# Patient Record
Sex: Male | Born: 1935 | Race: White | Hispanic: No | Marital: Married | Smoking: Former smoker
Health system: Southern US, Community
[De-identification: ages and names within clinical notes are randomized; demographics above are authoritative.]

## PROBLEM LIST (undated history)

## (undated) DIAGNOSIS — Z9289 Personal history of other medical treatment: Secondary | ICD-10-CM

## (undated) DIAGNOSIS — I2510 Atherosclerotic heart disease of native coronary artery without angina pectoris: Secondary | ICD-10-CM

## (undated) DIAGNOSIS — J189 Pneumonia, unspecified organism: Secondary | ICD-10-CM

## (undated) DIAGNOSIS — I10 Essential (primary) hypertension: Secondary | ICD-10-CM

## (undated) DIAGNOSIS — E785 Hyperlipidemia, unspecified: Secondary | ICD-10-CM

## (undated) DIAGNOSIS — I739 Peripheral vascular disease, unspecified: Secondary | ICD-10-CM

## (undated) DIAGNOSIS — I480 Paroxysmal atrial fibrillation: Secondary | ICD-10-CM

## (undated) DIAGNOSIS — IMO0002 Reserved for concepts with insufficient information to code with codable children: Secondary | ICD-10-CM

## (undated) DIAGNOSIS — K5790 Diverticulosis of intestine, part unspecified, without perforation or abscess without bleeding: Secondary | ICD-10-CM

## (undated) DIAGNOSIS — I219 Acute myocardial infarction, unspecified: Secondary | ICD-10-CM

## (undated) DIAGNOSIS — N189 Chronic kidney disease, unspecified: Secondary | ICD-10-CM

## (undated) DIAGNOSIS — D649 Anemia, unspecified: Secondary | ICD-10-CM

## (undated) DIAGNOSIS — N400 Enlarged prostate without lower urinary tract symptoms: Secondary | ICD-10-CM

## (undated) DIAGNOSIS — K449 Diaphragmatic hernia without obstruction or gangrene: Secondary | ICD-10-CM

## (undated) DIAGNOSIS — K2970 Gastritis, unspecified, without bleeding: Secondary | ICD-10-CM

## (undated) DIAGNOSIS — R55 Syncope and collapse: Secondary | ICD-10-CM

## (undated) DIAGNOSIS — I499 Cardiac arrhythmia, unspecified: Secondary | ICD-10-CM

## (undated) DIAGNOSIS — K219 Gastro-esophageal reflux disease without esophagitis: Secondary | ICD-10-CM

## (undated) DIAGNOSIS — E538 Deficiency of other specified B group vitamins: Secondary | ICD-10-CM

## (undated) DIAGNOSIS — E039 Hypothyroidism, unspecified: Secondary | ICD-10-CM

## (undated) DIAGNOSIS — N200 Calculus of kidney: Secondary | ICD-10-CM

## (undated) HISTORY — DX: Essential (primary) hypertension: I10

## (undated) HISTORY — DX: Peripheral vascular disease, unspecified: I73.9

## (undated) HISTORY — DX: Syncope and collapse: R55

## (undated) HISTORY — PX: CATARACT EXTRACTION: SUR2

## (undated) HISTORY — DX: Diverticulosis of intestine, part unspecified, without perforation or abscess without bleeding: K57.90

## (undated) HISTORY — DX: Personal history of other medical treatment: Z92.89

## (undated) HISTORY — DX: Hyperlipidemia, unspecified: E78.5

## (undated) HISTORY — DX: Diaphragmatic hernia without obstruction or gangrene: K44.9

## (undated) HISTORY — DX: Paroxysmal atrial fibrillation: I48.0

## (undated) HISTORY — DX: Atherosclerotic heart disease of native coronary artery without angina pectoris: I25.10

## (undated) HISTORY — DX: Deficiency of other specified B group vitamins: E53.8

## (undated) HISTORY — DX: Anemia, unspecified: D64.9

## (undated) HISTORY — DX: Reserved for concepts with insufficient information to code with codable children: IMO0002

## (undated) HISTORY — DX: Hypothyroidism, unspecified: E03.9

## (undated) HISTORY — DX: Benign prostatic hyperplasia without lower urinary tract symptoms: N40.0

## (undated) HISTORY — DX: Chronic kidney disease, unspecified: N18.9

## (undated) HISTORY — DX: Calculus of kidney: N20.0

## (undated) HISTORY — DX: Acute myocardial infarction, unspecified: I21.9

## (undated) HISTORY — DX: Gastritis, unspecified, without bleeding: K29.70

## (undated) HISTORY — DX: Gastro-esophageal reflux disease without esophagitis: K21.9

---

## 1988-10-05 DIAGNOSIS — I10 Essential (primary) hypertension: Secondary | ICD-10-CM | POA: Insufficient documentation

## 1991-10-06 DIAGNOSIS — E1149 Type 2 diabetes mellitus with other diabetic neurological complication: Secondary | ICD-10-CM

## 1996-07-05 HISTORY — PX: ANGIOPLASTY: SHX39

## 1997-05-25 ENCOUNTER — Encounter: Payer: Self-pay | Admitting: Family Medicine

## 1997-05-25 LAB — CONVERTED CEMR LAB: PSA: 1.1 ng/mL

## 1997-10-05 DIAGNOSIS — Z87898 Personal history of other specified conditions: Secondary | ICD-10-CM | POA: Insufficient documentation

## 1998-09-04 ENCOUNTER — Encounter: Payer: Self-pay | Admitting: Family Medicine

## 1998-09-04 LAB — CONVERTED CEMR LAB: Hgb A1c MFr Bld: 8.4 %

## 1999-02-03 ENCOUNTER — Encounter: Payer: Self-pay | Admitting: Family Medicine

## 1999-06-06 ENCOUNTER — Encounter: Payer: Self-pay | Admitting: Family Medicine

## 1999-06-06 LAB — CONVERTED CEMR LAB: Hgb A1c MFr Bld: 6.9 %

## 2000-02-03 ENCOUNTER — Encounter: Payer: Self-pay | Admitting: Family Medicine

## 2000-02-03 DIAGNOSIS — D649 Anemia, unspecified: Secondary | ICD-10-CM | POA: Insufficient documentation

## 2000-02-03 DIAGNOSIS — E538 Deficiency of other specified B group vitamins: Secondary | ICD-10-CM | POA: Insufficient documentation

## 2000-04-27 ENCOUNTER — Encounter: Payer: Self-pay | Admitting: Urology

## 2000-04-29 ENCOUNTER — Ambulatory Visit (HOSPITAL_COMMUNITY): Admission: RE | Admit: 2000-04-29 | Discharge: 2000-04-29 | Payer: Self-pay | Admitting: Urology

## 2000-04-29 ENCOUNTER — Encounter: Payer: Self-pay | Admitting: Urology

## 2000-04-29 HISTORY — PX: LITHOTRIPSY: SUR834

## 2000-09-04 ENCOUNTER — Encounter: Payer: Self-pay | Admitting: Family Medicine

## 2001-10-05 ENCOUNTER — Encounter: Payer: Self-pay | Admitting: Family Medicine

## 2001-10-05 LAB — CONVERTED CEMR LAB: PSA: 1 ng/mL

## 2002-04-04 ENCOUNTER — Encounter: Payer: Self-pay | Admitting: Family Medicine

## 2002-04-04 LAB — CONVERTED CEMR LAB: Hgb A1c MFr Bld: 6.7 %

## 2002-11-05 ENCOUNTER — Encounter: Payer: Self-pay | Admitting: Family Medicine

## 2002-11-05 LAB — CONVERTED CEMR LAB
Hgb A1c MFr Bld: 7.1 %
Microalbumin U total vol: 9 mg/L

## 2003-06-21 ENCOUNTER — Encounter: Payer: Self-pay | Admitting: Emergency Medicine

## 2003-06-21 ENCOUNTER — Inpatient Hospital Stay (HOSPITAL_COMMUNITY): Admission: EM | Admit: 2003-06-21 | Discharge: 2003-06-26 | Payer: Self-pay | Admitting: Emergency Medicine

## 2003-06-22 HISTORY — PX: CARDIAC CATHETERIZATION: SHX172

## 2003-06-25 ENCOUNTER — Encounter (INDEPENDENT_AMBULATORY_CARE_PROVIDER_SITE_OTHER): Payer: Self-pay | Admitting: Specialist

## 2003-07-06 ENCOUNTER — Encounter: Payer: Self-pay | Admitting: Family Medicine

## 2003-07-06 LAB — CONVERTED CEMR LAB: Hgb A1c MFr Bld: 6.1 %

## 2003-11-06 ENCOUNTER — Encounter: Payer: Self-pay | Admitting: Family Medicine

## 2003-11-06 LAB — CONVERTED CEMR LAB
Microalbumin U total vol: 6 mg/L
PSA: 0.6 ng/mL
PSA: 0.6 ng/mL

## 2004-04-15 ENCOUNTER — Ambulatory Visit (HOSPITAL_COMMUNITY): Admission: RE | Admit: 2004-04-15 | Discharge: 2004-04-16 | Payer: Self-pay | Admitting: Cardiology

## 2004-05-16 HISTORY — PX: ILIAC VEIN ANGIOPLASTY / STENTING: SHX1788

## 2004-06-05 DIAGNOSIS — E039 Hypothyroidism, unspecified: Secondary | ICD-10-CM | POA: Insufficient documentation

## 2004-07-05 ENCOUNTER — Encounter: Payer: Self-pay | Admitting: Family Medicine

## 2004-07-05 LAB — CONVERTED CEMR LAB: Hgb A1c MFr Bld: 6.6 %

## 2004-10-03 ENCOUNTER — Ambulatory Visit: Payer: Self-pay | Admitting: Family Medicine

## 2005-02-02 ENCOUNTER — Encounter: Payer: Self-pay | Admitting: Family Medicine

## 2005-02-02 LAB — CONVERTED CEMR LAB
Hgb A1c MFr Bld: 6.5 %
Microalbumin U total vol: 5.8 mg/L

## 2005-02-11 ENCOUNTER — Ambulatory Visit: Payer: Self-pay | Admitting: Family Medicine

## 2005-02-13 ENCOUNTER — Ambulatory Visit: Payer: Self-pay | Admitting: Family Medicine

## 2005-06-16 ENCOUNTER — Ambulatory Visit: Payer: Self-pay | Admitting: Family Medicine

## 2005-08-05 DIAGNOSIS — M109 Gout, unspecified: Secondary | ICD-10-CM

## 2005-09-04 ENCOUNTER — Encounter: Payer: Self-pay | Admitting: Family Medicine

## 2005-09-14 ENCOUNTER — Ambulatory Visit: Payer: Self-pay | Admitting: Family Medicine

## 2005-09-16 ENCOUNTER — Ambulatory Visit: Payer: Self-pay | Admitting: Family Medicine

## 2005-10-01 ENCOUNTER — Ambulatory Visit: Payer: Self-pay | Admitting: Family Medicine

## 2005-10-02 ENCOUNTER — Ambulatory Visit: Payer: Self-pay | Admitting: Family Medicine

## 2005-10-15 ENCOUNTER — Ambulatory Visit: Payer: Self-pay | Admitting: Family Medicine

## 2005-10-29 ENCOUNTER — Ambulatory Visit: Payer: Self-pay | Admitting: Family Medicine

## 2005-11-05 ENCOUNTER — Encounter: Payer: Self-pay | Admitting: Family Medicine

## 2005-11-05 LAB — CONVERTED CEMR LAB: Hgb A1c MFr Bld: 6.8 %

## 2005-11-13 ENCOUNTER — Ambulatory Visit: Payer: Self-pay | Admitting: Family Medicine

## 2005-11-19 ENCOUNTER — Ambulatory Visit: Payer: Self-pay | Admitting: Family Medicine

## 2005-11-24 ENCOUNTER — Ambulatory Visit: Payer: Self-pay | Admitting: Family Medicine

## 2005-12-10 ENCOUNTER — Ambulatory Visit: Payer: Self-pay | Admitting: Family Medicine

## 2005-12-25 ENCOUNTER — Ambulatory Visit: Payer: Self-pay | Admitting: Family Medicine

## 2006-01-06 ENCOUNTER — Ambulatory Visit: Payer: Self-pay | Admitting: Family Medicine

## 2006-01-21 ENCOUNTER — Ambulatory Visit: Payer: Self-pay | Admitting: Family Medicine

## 2006-02-02 ENCOUNTER — Encounter: Payer: Self-pay | Admitting: Family Medicine

## 2006-02-02 LAB — CONVERTED CEMR LAB
Hgb A1c MFr Bld: 6.7 %
PSA: 0.78 ng/mL

## 2006-02-03 ENCOUNTER — Ambulatory Visit: Payer: Self-pay | Admitting: Cardiology

## 2006-02-04 ENCOUNTER — Ambulatory Visit: Payer: Self-pay | Admitting: Family Medicine

## 2006-02-11 ENCOUNTER — Ambulatory Visit: Payer: Self-pay | Admitting: Family Medicine

## 2006-02-12 ENCOUNTER — Ambulatory Visit: Payer: Self-pay

## 2006-02-18 ENCOUNTER — Ambulatory Visit: Payer: Self-pay | Admitting: Family Medicine

## 2006-03-02 ENCOUNTER — Ambulatory Visit: Payer: Self-pay | Admitting: Family Medicine

## 2006-03-04 ENCOUNTER — Ambulatory Visit: Payer: Self-pay | Admitting: Family Medicine

## 2006-03-05 LAB — FECAL OCCULT BLOOD, GUAIAC: Fecal Occult Blood: NEGATIVE

## 2006-03-10 ENCOUNTER — Ambulatory Visit: Payer: Self-pay | Admitting: Family Medicine

## 2006-03-19 ENCOUNTER — Ambulatory Visit: Payer: Self-pay | Admitting: Family Medicine

## 2006-03-24 ENCOUNTER — Ambulatory Visit: Payer: Self-pay | Admitting: Family Medicine

## 2006-03-29 ENCOUNTER — Ambulatory Visit: Payer: Self-pay | Admitting: Family Medicine

## 2006-04-01 ENCOUNTER — Ambulatory Visit: Payer: Self-pay | Admitting: Family Medicine

## 2006-04-15 ENCOUNTER — Ambulatory Visit: Payer: Self-pay | Admitting: Family Medicine

## 2006-04-29 ENCOUNTER — Ambulatory Visit: Payer: Self-pay | Admitting: Family Medicine

## 2006-05-13 ENCOUNTER — Ambulatory Visit: Payer: Self-pay | Admitting: Internal Medicine

## 2006-05-27 ENCOUNTER — Ambulatory Visit: Payer: Self-pay | Admitting: Family Medicine

## 2006-06-11 ENCOUNTER — Ambulatory Visit: Payer: Self-pay | Admitting: Family Medicine

## 2006-06-22 ENCOUNTER — Ambulatory Visit: Payer: Self-pay | Admitting: Family Medicine

## 2006-07-02 ENCOUNTER — Ambulatory Visit: Payer: Self-pay | Admitting: Family Medicine

## 2006-07-22 ENCOUNTER — Ambulatory Visit: Payer: Self-pay | Admitting: Family Medicine

## 2006-08-06 ENCOUNTER — Ambulatory Visit: Payer: Self-pay | Admitting: Family Medicine

## 2006-08-19 ENCOUNTER — Inpatient Hospital Stay (HOSPITAL_COMMUNITY): Admission: EM | Admit: 2006-08-19 | Discharge: 2006-08-20 | Payer: Self-pay | Admitting: Emergency Medicine

## 2006-08-19 ENCOUNTER — Encounter: Payer: Self-pay | Admitting: Cardiology

## 2006-08-19 ENCOUNTER — Ambulatory Visit: Payer: Self-pay | Admitting: *Deleted

## 2006-08-20 ENCOUNTER — Ambulatory Visit: Payer: Self-pay | Admitting: Family Medicine

## 2006-08-24 ENCOUNTER — Ambulatory Visit: Payer: Self-pay

## 2006-08-24 ENCOUNTER — Ambulatory Visit: Payer: Self-pay | Admitting: Cardiovascular Disease

## 2006-08-30 ENCOUNTER — Ambulatory Visit: Payer: Self-pay | Admitting: Cardiology

## 2006-09-02 ENCOUNTER — Ambulatory Visit: Payer: Self-pay | Admitting: Family Medicine

## 2006-09-06 ENCOUNTER — Ambulatory Visit: Payer: Self-pay | Admitting: Cardiology

## 2006-09-14 ENCOUNTER — Ambulatory Visit: Payer: Self-pay | Admitting: Cardiovascular Disease

## 2006-09-16 ENCOUNTER — Ambulatory Visit: Payer: Self-pay | Admitting: Family Medicine

## 2006-09-20 ENCOUNTER — Ambulatory Visit: Payer: Self-pay | Admitting: Family Medicine

## 2006-09-21 ENCOUNTER — Inpatient Hospital Stay (HOSPITAL_COMMUNITY): Admission: EM | Admit: 2006-09-21 | Discharge: 2006-09-23 | Payer: Self-pay | Admitting: Emergency Medicine

## 2006-09-21 ENCOUNTER — Ambulatory Visit: Payer: Self-pay | Admitting: Cardiovascular Disease

## 2006-09-21 ENCOUNTER — Ambulatory Visit: Payer: Self-pay | Admitting: Cardiology

## 2006-09-30 ENCOUNTER — Ambulatory Visit: Payer: Self-pay | Admitting: Internal Medicine

## 2006-10-01 ENCOUNTER — Ambulatory Visit: Payer: Self-pay | Admitting: Cardiology

## 2006-10-08 ENCOUNTER — Ambulatory Visit: Payer: Self-pay | Admitting: Gastroenterology

## 2006-10-14 ENCOUNTER — Ambulatory Visit: Payer: Self-pay | Admitting: Family Medicine

## 2006-10-26 ENCOUNTER — Ambulatory Visit: Payer: Self-pay | Admitting: *Deleted

## 2006-10-26 ENCOUNTER — Ambulatory Visit: Payer: Self-pay | Admitting: Family Medicine

## 2006-11-09 ENCOUNTER — Ambulatory Visit: Payer: Self-pay | Admitting: Gastroenterology

## 2006-11-11 ENCOUNTER — Ambulatory Visit: Payer: Self-pay | Admitting: Family Medicine

## 2006-11-16 ENCOUNTER — Ambulatory Visit: Payer: Self-pay | Admitting: Cardiology

## 2006-11-22 ENCOUNTER — Ambulatory Visit: Payer: Self-pay | Admitting: Internal Medicine

## 2006-11-26 ENCOUNTER — Ambulatory Visit: Payer: Self-pay | Admitting: Family Medicine

## 2006-11-29 ENCOUNTER — Ambulatory Visit: Payer: Self-pay | Admitting: Cardiology

## 2006-12-07 ENCOUNTER — Ambulatory Visit: Payer: Self-pay | Admitting: Cardiology

## 2006-12-08 ENCOUNTER — Ambulatory Visit: Payer: Self-pay | Admitting: Gastroenterology

## 2006-12-10 ENCOUNTER — Ambulatory Visit: Payer: Self-pay | Admitting: Family Medicine

## 2006-12-17 ENCOUNTER — Ambulatory Visit: Payer: Self-pay | Admitting: Cardiovascular Disease

## 2006-12-23 ENCOUNTER — Ambulatory Visit: Payer: Self-pay | Admitting: Family Medicine

## 2006-12-31 ENCOUNTER — Ambulatory Visit: Payer: Self-pay | Admitting: Cardiology

## 2007-01-03 ENCOUNTER — Encounter: Payer: Self-pay | Admitting: Family Medicine

## 2007-01-03 DIAGNOSIS — R0602 Shortness of breath: Secondary | ICD-10-CM | POA: Insufficient documentation

## 2007-01-03 DIAGNOSIS — E785 Hyperlipidemia, unspecified: Secondary | ICD-10-CM

## 2007-01-06 ENCOUNTER — Ambulatory Visit: Payer: Self-pay | Admitting: Family Medicine

## 2007-01-06 LAB — CONVERTED CEMR LAB
BUN: 31 mg/dL — ABNORMAL HIGH (ref 6–23)
Calcium: 8.3 mg/dL — ABNORMAL LOW (ref 8.4–10.5)
Chloride: 106 meq/L (ref 96–112)
Creatinine, Ser: 2 mg/dL — ABNORMAL HIGH (ref 0.4–1.5)
GFR calc Af Amer: 42 mL/min
GFR calc non Af Amer: 35 mL/min
Glucose, Bld: 172 mg/dL — ABNORMAL HIGH (ref 70–99)
Potassium: 4 meq/L (ref 3.5–5.1)
Sodium: 143 meq/L (ref 135–145)

## 2007-01-10 ENCOUNTER — Ambulatory Visit: Payer: Self-pay | Admitting: Family Medicine

## 2007-01-14 ENCOUNTER — Ambulatory Visit: Payer: Self-pay | Admitting: Cardiovascular Disease

## 2007-01-20 ENCOUNTER — Ambulatory Visit: Payer: Self-pay | Admitting: Family Medicine

## 2007-01-21 ENCOUNTER — Ambulatory Visit: Payer: Self-pay | Admitting: Gastroenterology

## 2007-01-27 ENCOUNTER — Ambulatory Visit: Payer: Self-pay | Admitting: Cardiology

## 2007-02-04 ENCOUNTER — Ambulatory Visit: Payer: Self-pay | Admitting: Cardiology

## 2007-02-07 ENCOUNTER — Ambulatory Visit: Payer: Self-pay | Admitting: Family Medicine

## 2007-02-14 ENCOUNTER — Ambulatory Visit: Payer: Self-pay | Admitting: Family Medicine

## 2007-02-14 LAB — CONVERTED CEMR LAB
Calcium: 8.6 mg/dL (ref 8.4–10.5)
Chloride: 107 meq/L (ref 96–112)
GFR calc Af Amer: 48 mL/min
GFR calc non Af Amer: 39 mL/min
Potassium: 4.3 meq/L (ref 3.5–5.1)

## 2007-02-16 ENCOUNTER — Ambulatory Visit: Payer: Self-pay | Admitting: Family Medicine

## 2007-02-18 ENCOUNTER — Ambulatory Visit: Payer: Self-pay | Admitting: Cardiology

## 2007-03-04 ENCOUNTER — Ambulatory Visit: Payer: Self-pay | Admitting: Family Medicine

## 2007-03-17 ENCOUNTER — Ambulatory Visit: Payer: Self-pay | Admitting: Family Medicine

## 2007-03-18 ENCOUNTER — Ambulatory Visit: Payer: Self-pay | Admitting: Cardiovascular Disease

## 2007-03-31 ENCOUNTER — Ambulatory Visit: Payer: Self-pay | Admitting: Family Medicine

## 2007-04-05 ENCOUNTER — Encounter (INDEPENDENT_AMBULATORY_CARE_PROVIDER_SITE_OTHER): Payer: Self-pay | Admitting: *Deleted

## 2007-04-07 ENCOUNTER — Ambulatory Visit: Payer: Self-pay | Admitting: Family Medicine

## 2007-04-07 LAB — CONVERTED CEMR LAB
BUN: 21 mg/dL (ref 6–23)
CO2: 26 meq/L (ref 19–32)
Calcium: 9.5 mg/dL (ref 8.4–10.5)
Glucose, Bld: 149 mg/dL — ABNORMAL HIGH (ref 70–99)
Sodium: 140 meq/L (ref 135–145)

## 2007-04-12 ENCOUNTER — Ambulatory Visit: Payer: Self-pay | Admitting: Family Medicine

## 2007-04-15 ENCOUNTER — Ambulatory Visit: Payer: Self-pay | Admitting: Cardiology

## 2007-04-28 ENCOUNTER — Ambulatory Visit: Payer: Self-pay | Admitting: Family Medicine

## 2007-05-12 ENCOUNTER — Ambulatory Visit: Payer: Self-pay | Admitting: Family Medicine

## 2007-05-13 ENCOUNTER — Ambulatory Visit: Payer: Self-pay | Admitting: Cardiology

## 2007-06-10 ENCOUNTER — Ambulatory Visit: Payer: Self-pay | Admitting: Internal Medicine

## 2007-06-13 ENCOUNTER — Ambulatory Visit: Payer: Self-pay | Admitting: Family Medicine

## 2007-06-23 ENCOUNTER — Ambulatory Visit: Payer: Self-pay | Admitting: Family Medicine

## 2007-07-08 ENCOUNTER — Ambulatory Visit: Payer: Self-pay | Admitting: Internal Medicine

## 2007-07-08 ENCOUNTER — Ambulatory Visit: Payer: Self-pay | Admitting: Family Medicine

## 2007-07-13 ENCOUNTER — Ambulatory Visit: Payer: Self-pay | Admitting: Family Medicine

## 2007-07-13 LAB — CONVERTED CEMR LAB
CO2: 28 meq/L (ref 19–32)
Cholesterol: 182 mg/dL (ref 0–200)
Creatinine, Ser: 1.6 mg/dL — ABNORMAL HIGH (ref 0.4–1.5)
GFR calc Af Amer: 54 mL/min
GFR calc non Af Amer: 45 mL/min
Glucose, Bld: 178 mg/dL — ABNORMAL HIGH (ref 70–99)
HDL: 33.9 mg/dL — ABNORMAL LOW (ref >39.0)
Hgb A1c MFr Bld: 7.1 % — ABNORMAL HIGH (ref 4.6–6.0)
Sodium: 141 meq/L (ref 135–145)
Total CHOL/HDL Ratio: 5.4
Triglycerides: 257 mg/dL (ref 0–149)

## 2007-07-18 ENCOUNTER — Ambulatory Visit: Payer: Self-pay | Admitting: Family Medicine

## 2007-07-26 ENCOUNTER — Ambulatory Visit: Payer: Self-pay | Admitting: Gastroenterology

## 2007-07-26 LAB — CONVERTED CEMR LAB
AST: 25 units/L (ref 0–37)
Albumin: 3.3 g/dL — ABNORMAL LOW (ref 3.5–5.2)
Alkaline Phosphatase: 60 units/L (ref 39–117)
Bilirubin, Direct: 0.2 mg/dL (ref 0.0–0.3)
Calcium: 8.8 mg/dL (ref 8.4–10.5)
Chloride: 101 meq/L (ref 96–112)
Eosinophils Absolute: 0 10*3/uL (ref 0.0–0.6)
Eosinophils Relative: 0.2 % (ref 0.0–5.0)
GFR calc non Af Amer: 37 mL/min
Glucose, Bld: 234 mg/dL — ABNORMAL HIGH (ref 70–99)
HCT: 34.9 % — ABNORMAL LOW (ref 39.0–52.0)
Hemoglobin: 11.8 g/dL — ABNORMAL LOW (ref 13.0–17.0)
MCHC: 33.7 g/dL (ref 30.0–36.0)
MCV: 87.9 fL (ref 78.0–100.0)
Monocytes Absolute: 1.1 10*3/uL — ABNORMAL HIGH (ref 0.2–0.7)
Monocytes Relative: 9.4 % (ref 3.0–11.0)
Neutrophils Relative %: 78.6 % — ABNORMAL HIGH (ref 43.0–77.0)
Platelets: 288 10*3/uL (ref 150–400)
Potassium: 4.6 meq/L (ref 3.5–5.1)
RDW: 13.3 % (ref 11.5–14.6)
Total Protein: 6.9 g/dL (ref 6.0–8.3)

## 2007-07-28 ENCOUNTER — Ambulatory Visit (HOSPITAL_COMMUNITY): Admission: RE | Admit: 2007-07-28 | Discharge: 2007-07-28 | Payer: Self-pay | Admitting: Gastroenterology

## 2007-08-04 ENCOUNTER — Ambulatory Visit: Payer: Self-pay | Admitting: Family Medicine

## 2007-08-04 ENCOUNTER — Ambulatory Visit: Payer: Self-pay | Admitting: Internal Medicine

## 2007-08-05 ENCOUNTER — Ambulatory Visit: Payer: Self-pay | Admitting: Gastroenterology

## 2007-08-11 ENCOUNTER — Ambulatory Visit: Payer: Self-pay | Admitting: Cardiology

## 2007-08-18 ENCOUNTER — Ambulatory Visit: Payer: Self-pay | Admitting: Family Medicine

## 2007-08-29 ENCOUNTER — Ambulatory Visit: Payer: Self-pay | Admitting: Family Medicine

## 2007-08-29 LAB — CONVERTED CEMR LAB: AST: 19 units/L (ref 0–37)

## 2007-09-13 ENCOUNTER — Encounter: Payer: Self-pay | Admitting: Family Medicine

## 2007-09-15 ENCOUNTER — Ambulatory Visit: Payer: Self-pay | Admitting: Family Medicine

## 2007-09-16 ENCOUNTER — Encounter: Payer: Self-pay | Admitting: Family Medicine

## 2007-09-21 ENCOUNTER — Ambulatory Visit: Payer: Self-pay | Admitting: Internal Medicine

## 2007-09-22 ENCOUNTER — Encounter: Payer: Self-pay | Admitting: Family Medicine

## 2007-09-22 ENCOUNTER — Inpatient Hospital Stay (HOSPITAL_COMMUNITY): Admission: RE | Admit: 2007-09-22 | Discharge: 2007-09-23 | Payer: Self-pay | Admitting: Orthopaedic Surgery

## 2007-09-23 ENCOUNTER — Encounter: Payer: Self-pay | Admitting: Family Medicine

## 2007-09-28 HISTORY — PX: LAMINECTOMY: SHX219

## 2007-10-03 ENCOUNTER — Inpatient Hospital Stay (HOSPITAL_COMMUNITY): Admission: AD | Admit: 2007-10-03 | Discharge: 2007-10-04 | Payer: Self-pay | Admitting: Orthopaedic Surgery

## 2007-10-14 ENCOUNTER — Ambulatory Visit: Payer: Self-pay | Admitting: Family Medicine

## 2007-10-14 LAB — CONVERTED CEMR LAB
AST: 23 units/L (ref 0–37)
Cholesterol: 148 mg/dL (ref 0–200)
HDL: 33.4 mg/dL — ABNORMAL LOW (ref >39.0)
Total CHOL/HDL Ratio: 4.4
Triglycerides: 159 mg/dL — ABNORMAL HIGH (ref 0–149)

## 2007-10-18 ENCOUNTER — Ambulatory Visit: Payer: Self-pay | Admitting: Family Medicine

## 2007-10-18 LAB — CONVERTED CEMR LAB
BUN: 13 mg/dL (ref 6–23)
CO2: 29 meq/L (ref 19–32)
Calcium: 8.6 mg/dL (ref 8.4–10.5)
Creatinine, Ser: 1.4 mg/dL (ref 0.4–1.5)
Glucose, Bld: 213 mg/dL — ABNORMAL HIGH (ref 70–99)

## 2007-11-02 ENCOUNTER — Ambulatory Visit: Payer: Self-pay | Admitting: Family Medicine

## 2007-11-17 ENCOUNTER — Ambulatory Visit: Payer: Self-pay | Admitting: Family Medicine

## 2007-11-21 ENCOUNTER — Ambulatory Visit: Payer: Self-pay | Admitting: Family Medicine

## 2007-11-21 LAB — CONVERTED CEMR LAB
CO2: 31 meq/L (ref 19–32)
Calcium: 9.7 mg/dL (ref 8.4–10.5)
Chloride: 102 meq/L (ref 96–112)
Creatinine, Ser: 1.2 mg/dL (ref 0.4–1.5)
Glucose, Bld: 222 mg/dL — ABNORMAL HIGH (ref 70–99)
Sodium: 141 meq/L (ref 135–145)

## 2007-11-25 ENCOUNTER — Ambulatory Visit: Payer: Self-pay | Admitting: Family Medicine

## 2007-11-28 DIAGNOSIS — K2990 Gastroduodenitis, unspecified, without bleeding: Secondary | ICD-10-CM

## 2007-11-28 DIAGNOSIS — I219 Acute myocardial infarction, unspecified: Secondary | ICD-10-CM | POA: Insufficient documentation

## 2007-11-28 DIAGNOSIS — K2970 Gastritis, unspecified, without bleeding: Secondary | ICD-10-CM | POA: Insufficient documentation

## 2007-11-28 DIAGNOSIS — K449 Diaphragmatic hernia without obstruction or gangrene: Secondary | ICD-10-CM | POA: Insufficient documentation

## 2007-11-28 DIAGNOSIS — K219 Gastro-esophageal reflux disease without esophagitis: Secondary | ICD-10-CM | POA: Insufficient documentation

## 2007-11-28 DIAGNOSIS — I498 Other specified cardiac arrhythmias: Secondary | ICD-10-CM | POA: Insufficient documentation

## 2007-11-28 DIAGNOSIS — K649 Unspecified hemorrhoids: Secondary | ICD-10-CM | POA: Insufficient documentation

## 2007-11-30 ENCOUNTER — Ambulatory Visit: Payer: Self-pay | Admitting: Family Medicine

## 2007-12-01 ENCOUNTER — Ambulatory Visit: Payer: Self-pay | Admitting: Cardiology

## 2007-12-01 LAB — CONVERTED CEMR LAB: Prothrombin Time: 28.3 s — ABNORMAL HIGH (ref 10.9–13.3)

## 2007-12-09 ENCOUNTER — Ambulatory Visit: Payer: Self-pay | Admitting: Family Medicine

## 2007-12-12 ENCOUNTER — Ambulatory Visit: Payer: Self-pay | Admitting: Cardiology

## 2007-12-16 ENCOUNTER — Ambulatory Visit: Payer: Self-pay | Admitting: Family Medicine

## 2007-12-22 ENCOUNTER — Ambulatory Visit: Payer: Self-pay | Admitting: Cardiology

## 2007-12-23 ENCOUNTER — Ambulatory Visit: Payer: Self-pay | Admitting: Family Medicine

## 2007-12-23 DIAGNOSIS — M549 Dorsalgia, unspecified: Secondary | ICD-10-CM | POA: Insufficient documentation

## 2007-12-26 ENCOUNTER — Encounter: Payer: Self-pay | Admitting: Family Medicine

## 2007-12-28 ENCOUNTER — Ambulatory Visit: Payer: Self-pay | Admitting: Family Medicine

## 2007-12-29 ENCOUNTER — Ambulatory Visit: Payer: Self-pay | Admitting: Internal Medicine

## 2008-01-05 ENCOUNTER — Ambulatory Visit: Payer: Self-pay | Admitting: Internal Medicine

## 2008-01-10 ENCOUNTER — Ambulatory Visit: Payer: Self-pay | Admitting: Family Medicine

## 2008-01-16 ENCOUNTER — Encounter: Payer: Self-pay | Admitting: Family Medicine

## 2008-01-19 ENCOUNTER — Ambulatory Visit: Payer: Self-pay | Admitting: Cardiology

## 2008-01-19 LAB — CONVERTED CEMR LAB
INR: 7.1 (ref 0.8–1.0)
Prothrombin Time: 66.7 s (ref 10.9–13.3)

## 2008-01-23 ENCOUNTER — Ambulatory Visit: Payer: Self-pay | Admitting: Cardiology

## 2008-01-23 ENCOUNTER — Ambulatory Visit: Payer: Self-pay | Admitting: Family Medicine

## 2008-01-23 LAB — CONVERTED CEMR LAB
BUN: 20 mg/dL (ref 6–23)
Chloride: 109 meq/L (ref 96–112)
GFR calc Af Amer: 76 mL/min
GFR calc non Af Amer: 63 mL/min
Hgb A1c MFr Bld: 9.2 % — ABNORMAL HIGH (ref 4.6–6.0)
Potassium: 4.7 meq/L (ref 3.5–5.1)
Sodium: 141 meq/L (ref 135–145)

## 2008-01-26 ENCOUNTER — Ambulatory Visit: Payer: Self-pay | Admitting: Internal Medicine

## 2008-01-27 ENCOUNTER — Ambulatory Visit: Payer: Self-pay | Admitting: Family Medicine

## 2008-02-06 ENCOUNTER — Ambulatory Visit: Payer: Self-pay | Admitting: Internal Medicine

## 2008-02-08 ENCOUNTER — Ambulatory Visit: Payer: Self-pay | Admitting: Family Medicine

## 2008-02-14 ENCOUNTER — Ambulatory Visit: Payer: Self-pay | Admitting: Cardiovascular Disease

## 2008-02-21 ENCOUNTER — Ambulatory Visit: Payer: Self-pay | Admitting: Family Medicine

## 2008-02-22 ENCOUNTER — Ambulatory Visit: Payer: Self-pay | Admitting: Family Medicine

## 2008-02-23 LAB — CONVERTED CEMR LAB
Glucose, Bld: 208 mg/dL — ABNORMAL HIGH (ref 70–99)
Hgb A1c MFr Bld: 8.9 % — ABNORMAL HIGH (ref 4.6–6.0)

## 2008-02-29 ENCOUNTER — Ambulatory Visit: Payer: Self-pay | Admitting: Cardiology

## 2008-02-29 ENCOUNTER — Ambulatory Visit: Payer: Self-pay | Admitting: Family Medicine

## 2008-03-12 ENCOUNTER — Ambulatory Visit: Payer: Self-pay | Admitting: Cardiology

## 2008-03-20 ENCOUNTER — Ambulatory Visit: Payer: Self-pay | Admitting: Family Medicine

## 2008-04-02 ENCOUNTER — Ambulatory Visit: Payer: Self-pay | Admitting: Cardiology

## 2008-04-04 ENCOUNTER — Ambulatory Visit: Payer: Self-pay | Admitting: Family Medicine

## 2008-04-12 ENCOUNTER — Ambulatory Visit: Payer: Self-pay | Admitting: Family Medicine

## 2008-04-16 ENCOUNTER — Ambulatory Visit: Payer: Self-pay | Admitting: Cardiology

## 2008-04-16 ENCOUNTER — Telehealth: Payer: Self-pay | Admitting: Family Medicine

## 2008-04-17 ENCOUNTER — Ambulatory Visit: Payer: Self-pay | Admitting: Family Medicine

## 2008-04-30 ENCOUNTER — Ambulatory Visit: Payer: Self-pay | Admitting: Cardiovascular Disease

## 2008-05-03 ENCOUNTER — Ambulatory Visit: Payer: Self-pay | Admitting: Family Medicine

## 2008-05-11 ENCOUNTER — Ambulatory Visit: Payer: Self-pay | Admitting: Cardiology

## 2008-05-21 ENCOUNTER — Ambulatory Visit: Payer: Self-pay | Admitting: Family Medicine

## 2008-05-25 ENCOUNTER — Ambulatory Visit: Payer: Self-pay | Admitting: Internal Medicine

## 2008-06-06 ENCOUNTER — Ambulatory Visit: Payer: Self-pay | Admitting: Family Medicine

## 2008-06-07 ENCOUNTER — Encounter: Payer: Self-pay | Admitting: Family Medicine

## 2008-06-08 ENCOUNTER — Ambulatory Visit: Payer: Self-pay | Admitting: Cardiovascular Disease

## 2008-06-14 ENCOUNTER — Ambulatory Visit: Payer: Self-pay | Admitting: Family Medicine

## 2008-06-18 DIAGNOSIS — N259 Disorder resulting from impaired renal tubular function, unspecified: Secondary | ICD-10-CM | POA: Insufficient documentation

## 2008-06-20 ENCOUNTER — Ambulatory Visit: Payer: Self-pay | Admitting: Family Medicine

## 2008-07-02 ENCOUNTER — Encounter: Payer: Self-pay | Admitting: Family Medicine

## 2008-07-04 ENCOUNTER — Ambulatory Visit: Payer: Self-pay | Admitting: Family Medicine

## 2008-07-06 ENCOUNTER — Ambulatory Visit: Payer: Self-pay | Admitting: Cardiovascular Disease

## 2008-07-20 ENCOUNTER — Ambulatory Visit: Payer: Self-pay | Admitting: Family Medicine

## 2008-08-02 ENCOUNTER — Ambulatory Visit: Payer: Self-pay | Admitting: Family Medicine

## 2008-08-03 ENCOUNTER — Ambulatory Visit: Payer: Self-pay | Admitting: Internal Medicine

## 2008-08-14 ENCOUNTER — Ambulatory Visit: Payer: Self-pay | Admitting: Family Medicine

## 2008-08-15 DIAGNOSIS — I2510 Atherosclerotic heart disease of native coronary artery without angina pectoris: Secondary | ICD-10-CM | POA: Insufficient documentation

## 2008-08-15 DIAGNOSIS — I701 Atherosclerosis of renal artery: Secondary | ICD-10-CM | POA: Insufficient documentation

## 2008-08-16 ENCOUNTER — Ambulatory Visit: Payer: Self-pay | Admitting: Cardiology

## 2008-08-27 ENCOUNTER — Ambulatory Visit: Payer: Self-pay

## 2008-08-31 ENCOUNTER — Encounter: Admission: RE | Admit: 2008-08-31 | Discharge: 2008-08-31 | Payer: Self-pay | Admitting: Emergency Medicine

## 2008-08-31 ENCOUNTER — Encounter: Payer: Self-pay | Admitting: Family Medicine

## 2008-08-31 DIAGNOSIS — K7689 Other specified diseases of liver: Secondary | ICD-10-CM

## 2008-09-02 ENCOUNTER — Encounter: Payer: Self-pay | Admitting: Family Medicine

## 2008-09-04 ENCOUNTER — Telehealth: Payer: Self-pay | Admitting: Gastroenterology

## 2008-09-11 ENCOUNTER — Ambulatory Visit: Payer: Self-pay | Admitting: Family Medicine

## 2008-09-11 LAB — CONVERTED CEMR LAB
CO2: 26 meq/L (ref 19–32)
Calcium: 9 mg/dL (ref 8.4–10.5)
Creatinine, Ser: 1.4 mg/dL (ref 0.4–1.5)
GFR calc Af Amer: 63 mL/min
Hgb A1c MFr Bld: 9 % — ABNORMAL HIGH (ref 4.6–6.0)
Sodium: 136 meq/L (ref 135–145)

## 2008-09-12 ENCOUNTER — Ambulatory Visit: Payer: Self-pay | Admitting: Internal Medicine

## 2008-09-18 ENCOUNTER — Ambulatory Visit: Payer: Self-pay | Admitting: Internal Medicine

## 2008-09-18 ENCOUNTER — Ambulatory Visit: Payer: Self-pay | Admitting: Family Medicine

## 2008-09-25 ENCOUNTER — Ambulatory Visit: Payer: Self-pay | Admitting: Internal Medicine

## 2008-10-03 ENCOUNTER — Ambulatory Visit: Payer: Self-pay | Admitting: Family Medicine

## 2008-10-09 ENCOUNTER — Ambulatory Visit: Payer: Self-pay | Admitting: Internal Medicine

## 2008-10-17 ENCOUNTER — Ambulatory Visit: Payer: Self-pay | Admitting: Family Medicine

## 2008-10-17 ENCOUNTER — Ambulatory Visit: Payer: Self-pay | Admitting: Gastroenterology

## 2008-10-17 DIAGNOSIS — R109 Unspecified abdominal pain: Secondary | ICD-10-CM | POA: Insufficient documentation

## 2008-10-18 LAB — CONVERTED CEMR LAB
AST: 25 units/L (ref 0–37)
Albumin: 3.6 g/dL (ref 3.5–5.2)
Alkaline Phosphatase: 49 units/L (ref 39–117)
Chloride: 106 meq/L (ref 96–112)
Eosinophils Absolute: 0.1 10*3/uL (ref 0.0–0.7)
Glucose, Bld: 263 mg/dL — ABNORMAL HIGH (ref 70–99)
HCT: 36.7 % — ABNORMAL LOW (ref 39.0–52.0)
Monocytes Absolute: 0.6 10*3/uL (ref 0.1–1.0)
Monocytes Relative: 7.1 % (ref 3.0–12.0)
Neutrophils Relative %: 66.9 % (ref 43.0–77.0)
Platelets: 202 10*3/uL (ref 150–400)
Potassium: 4.5 meq/L (ref 3.5–5.1)
RDW: 12.7 % (ref 11.5–14.6)
Sodium: 138 meq/L (ref 135–145)
Total Protein: 7 g/dL (ref 6.0–8.3)

## 2008-10-24 ENCOUNTER — Ambulatory Visit (HOSPITAL_COMMUNITY): Admission: RE | Admit: 2008-10-24 | Discharge: 2008-10-24 | Payer: Self-pay | Admitting: Gastroenterology

## 2008-10-31 ENCOUNTER — Ambulatory Visit: Payer: Self-pay | Admitting: Family Medicine

## 2008-11-01 ENCOUNTER — Telehealth: Payer: Self-pay | Admitting: Family Medicine

## 2008-11-06 ENCOUNTER — Ambulatory Visit: Payer: Self-pay | Admitting: Cardiology

## 2008-11-14 ENCOUNTER — Ambulatory Visit: Payer: Self-pay | Admitting: Family Medicine

## 2008-11-29 ENCOUNTER — Ambulatory Visit: Payer: Self-pay | Admitting: Family Medicine

## 2008-12-06 ENCOUNTER — Ambulatory Visit: Payer: Self-pay | Admitting: Cardiovascular Disease

## 2008-12-13 ENCOUNTER — Ambulatory Visit: Payer: Self-pay | Admitting: Family Medicine

## 2008-12-17 ENCOUNTER — Encounter: Payer: Self-pay | Admitting: Family Medicine

## 2008-12-18 ENCOUNTER — Ambulatory Visit: Payer: Self-pay | Admitting: Family Medicine

## 2008-12-20 ENCOUNTER — Ambulatory Visit: Payer: Self-pay | Admitting: Internal Medicine

## 2008-12-27 ENCOUNTER — Ambulatory Visit: Payer: Self-pay | Admitting: Family Medicine

## 2009-01-10 ENCOUNTER — Ambulatory Visit: Payer: Self-pay | Admitting: Family Medicine

## 2009-01-10 ENCOUNTER — Ambulatory Visit: Payer: Self-pay | Admitting: Internal Medicine

## 2009-01-17 ENCOUNTER — Ambulatory Visit: Payer: Self-pay | Admitting: Family Medicine

## 2009-01-24 ENCOUNTER — Ambulatory Visit: Payer: Self-pay | Admitting: Family Medicine

## 2009-02-07 ENCOUNTER — Ambulatory Visit: Payer: Self-pay | Admitting: Family Medicine

## 2009-02-07 ENCOUNTER — Ambulatory Visit: Payer: Self-pay | Admitting: Internal Medicine

## 2009-02-22 ENCOUNTER — Ambulatory Visit: Payer: Self-pay | Admitting: Family Medicine

## 2009-02-26 ENCOUNTER — Encounter: Payer: Self-pay | Admitting: Family Medicine

## 2009-03-05 ENCOUNTER — Encounter: Payer: Self-pay | Admitting: *Deleted

## 2009-03-06 ENCOUNTER — Ambulatory Visit: Payer: Self-pay | Admitting: Family Medicine

## 2009-03-07 ENCOUNTER — Ambulatory Visit: Payer: Self-pay | Admitting: Internal Medicine

## 2009-03-21 ENCOUNTER — Ambulatory Visit: Payer: Self-pay | Admitting: Family Medicine

## 2009-04-04 ENCOUNTER — Ambulatory Visit: Payer: Self-pay | Admitting: Family Medicine

## 2009-04-04 ENCOUNTER — Ambulatory Visit: Payer: Self-pay | Admitting: Cardiology

## 2009-04-04 LAB — CONVERTED CEMR LAB
POC INR: 1.4
Prothrombin Time: 14.5 s

## 2009-04-10 ENCOUNTER — Encounter: Payer: Self-pay | Admitting: *Deleted

## 2009-04-17 ENCOUNTER — Encounter (INDEPENDENT_AMBULATORY_CARE_PROVIDER_SITE_OTHER): Payer: Self-pay | Admitting: *Deleted

## 2009-04-18 ENCOUNTER — Ambulatory Visit: Payer: Self-pay | Admitting: Internal Medicine

## 2009-04-19 ENCOUNTER — Ambulatory Visit: Payer: Self-pay | Admitting: Family Medicine

## 2009-04-19 LAB — CONVERTED CEMR LAB
Chloride: 106 meq/L (ref 96–112)
Hgb A1c MFr Bld: 9.6 % — ABNORMAL HIGH (ref 4.6–6.5)
Potassium: 5.1 meq/L (ref 3.5–5.1)

## 2009-04-22 ENCOUNTER — Ambulatory Visit: Payer: Self-pay | Admitting: Family Medicine

## 2009-05-02 ENCOUNTER — Ambulatory Visit: Payer: Self-pay | Admitting: Cardiovascular Disease

## 2009-05-02 LAB — CONVERTED CEMR LAB
POC INR: 1.7
Prothrombin Time: 16.1 s

## 2009-05-03 ENCOUNTER — Ambulatory Visit: Payer: Self-pay | Admitting: Family Medicine

## 2009-05-16 ENCOUNTER — Ambulatory Visit: Payer: Self-pay | Admitting: Internal Medicine

## 2009-05-16 LAB — CONVERTED CEMR LAB: POC INR: 1.9

## 2009-05-17 ENCOUNTER — Ambulatory Visit: Payer: Self-pay | Admitting: Family Medicine

## 2009-05-30 ENCOUNTER — Ambulatory Visit: Payer: Self-pay | Admitting: Cardiovascular Disease

## 2009-05-30 LAB — CONVERTED CEMR LAB: POC INR: 2.7

## 2009-05-31 ENCOUNTER — Ambulatory Visit: Payer: Self-pay | Admitting: Family Medicine

## 2009-06-17 ENCOUNTER — Ambulatory Visit: Payer: Self-pay | Admitting: Family Medicine

## 2009-06-19 ENCOUNTER — Ambulatory Visit: Payer: Self-pay | Admitting: Family Medicine

## 2009-06-20 ENCOUNTER — Ambulatory Visit: Payer: Self-pay | Admitting: Cardiology

## 2009-06-20 LAB — CONVERTED CEMR LAB: POC INR: 3.5

## 2009-06-22 LAB — CONVERTED CEMR LAB
CO2: 22 meq/L (ref 19–32)
Calcium: 8.9 mg/dL (ref 8.4–10.5)
Creatinine, Ser: 1.7 mg/dL — ABNORMAL HIGH (ref 0.4–1.5)
Hgb A1c MFr Bld: 10.2 % — ABNORMAL HIGH (ref 4.6–6.5)

## 2009-06-27 ENCOUNTER — Ambulatory Visit: Payer: Self-pay | Admitting: Family Medicine

## 2009-06-27 DIAGNOSIS — J309 Allergic rhinitis, unspecified: Secondary | ICD-10-CM | POA: Insufficient documentation

## 2009-07-08 ENCOUNTER — Ambulatory Visit: Payer: Self-pay | Admitting: Family Medicine

## 2009-07-11 ENCOUNTER — Ambulatory Visit: Payer: Self-pay | Admitting: Internal Medicine

## 2009-07-18 ENCOUNTER — Ambulatory Visit: Payer: Self-pay | Admitting: Cardiology

## 2009-07-22 ENCOUNTER — Ambulatory Visit: Payer: Self-pay | Admitting: Family Medicine

## 2009-07-29 ENCOUNTER — Ambulatory Visit: Payer: Self-pay | Admitting: Family Medicine

## 2009-08-01 ENCOUNTER — Ambulatory Visit: Payer: Self-pay | Admitting: Cardiovascular Disease

## 2009-08-06 ENCOUNTER — Telehealth: Payer: Self-pay | Admitting: Family Medicine

## 2009-08-06 ENCOUNTER — Ambulatory Visit: Payer: Self-pay | Admitting: Family Medicine

## 2009-08-15 ENCOUNTER — Ambulatory Visit: Payer: Self-pay | Admitting: Internal Medicine

## 2009-08-20 ENCOUNTER — Ambulatory Visit: Payer: Self-pay | Admitting: Family Medicine

## 2009-09-02 ENCOUNTER — Ambulatory Visit: Payer: Self-pay | Admitting: Family Medicine

## 2009-09-02 DIAGNOSIS — E669 Obesity, unspecified: Secondary | ICD-10-CM

## 2009-09-04 ENCOUNTER — Ambulatory Visit: Payer: Self-pay | Admitting: Family Medicine

## 2009-09-05 ENCOUNTER — Ambulatory Visit: Payer: Self-pay | Admitting: Cardiovascular Disease

## 2009-09-05 LAB — CONVERTED CEMR LAB: POC INR: 4.4

## 2009-09-18 ENCOUNTER — Ambulatory Visit: Payer: Self-pay | Admitting: Family Medicine

## 2009-09-25 ENCOUNTER — Ambulatory Visit: Payer: Self-pay | Admitting: Family Medicine

## 2009-09-26 ENCOUNTER — Ambulatory Visit: Payer: Self-pay | Admitting: Cardiology

## 2009-09-26 LAB — CONVERTED CEMR LAB
INR: 5.3
POC INR: 6.6
Prothrombin Time: 53.9 s
Prothrombin Time: 53.9 s (ref 9.1–11.7)

## 2009-10-01 ENCOUNTER — Ambulatory Visit: Payer: Self-pay | Admitting: Family Medicine

## 2009-10-02 ENCOUNTER — Ambulatory Visit: Payer: Self-pay | Admitting: Family Medicine

## 2009-10-02 DIAGNOSIS — R42 Dizziness and giddiness: Secondary | ICD-10-CM

## 2009-10-03 ENCOUNTER — Ambulatory Visit: Payer: Self-pay | Admitting: Family Medicine

## 2009-10-07 ENCOUNTER — Ambulatory Visit: Payer: Self-pay | Admitting: Cardiology

## 2009-10-07 ENCOUNTER — Inpatient Hospital Stay (HOSPITAL_COMMUNITY): Admission: EM | Admit: 2009-10-07 | Discharge: 2009-10-09 | Payer: Self-pay | Admitting: Emergency Medicine

## 2009-10-07 ENCOUNTER — Encounter: Payer: Self-pay | Admitting: Family Medicine

## 2009-10-07 ENCOUNTER — Telehealth: Payer: Self-pay | Admitting: Family Medicine

## 2009-10-07 DIAGNOSIS — R55 Syncope and collapse: Secondary | ICD-10-CM

## 2009-10-07 HISTORY — DX: Syncope and collapse: R55

## 2009-10-07 LAB — CONVERTED CEMR LAB
Alkaline Phosphatase: 51 units/L (ref 39–117)
Basophils Absolute: 0 10*3/uL (ref 0.0–0.1)
Basophils Relative: 0 % (ref 0–1)
CO2: 19 meq/L (ref 19–32)
Creatinine, Ser: 1.85 mg/dL — ABNORMAL HIGH (ref 0.40–1.50)
Eosinophils Absolute: 0 10*3/uL (ref 0.0–0.7)
Glucose, Bld: 249 mg/dL — ABNORMAL HIGH (ref 70–99)
Lipase: 33 units/L (ref 0–75)
MCHC: 34.7 g/dL (ref 30.0–36.0)
MCV: 94.3 fL (ref 78.0–100.0)
Monocytes Relative: 9 % (ref 3–12)
Neutro Abs: 9.5 10*3/uL — ABNORMAL HIGH (ref 1.7–7.7)
Neutrophils Relative %: 80 % — ABNORMAL HIGH (ref 43–77)
RBC: 4 M/uL — ABNORMAL LOW (ref 4.22–5.81)
RDW: 12.9 % (ref 11.5–15.5)
Sodium: 133 meq/L — ABNORMAL LOW (ref 135–145)
TSH: 3.408 microintl units/mL (ref 0.350–4.500)
Total Bilirubin: 0.8 mg/dL (ref 0.3–1.2)

## 2009-10-08 ENCOUNTER — Telehealth (INDEPENDENT_AMBULATORY_CARE_PROVIDER_SITE_OTHER): Payer: Self-pay | Admitting: *Deleted

## 2009-10-09 ENCOUNTER — Telehealth: Payer: Self-pay | Admitting: Cardiology

## 2009-10-09 ENCOUNTER — Ambulatory Visit: Payer: Self-pay | Admitting: Internal Medicine

## 2009-10-09 ENCOUNTER — Encounter: Payer: Self-pay | Admitting: Family Medicine

## 2009-10-09 ENCOUNTER — Encounter: Payer: Self-pay | Admitting: Cardiology

## 2009-10-10 ENCOUNTER — Telehealth: Payer: Self-pay | Admitting: Family Medicine

## 2009-10-11 ENCOUNTER — Telehealth: Payer: Self-pay | Admitting: Family Medicine

## 2009-10-14 ENCOUNTER — Encounter: Payer: Self-pay | Admitting: Gastroenterology

## 2009-10-17 ENCOUNTER — Ambulatory Visit: Payer: Self-pay | Admitting: Internal Medicine

## 2009-10-17 ENCOUNTER — Telehealth: Payer: Self-pay | Admitting: Cardiovascular Disease

## 2009-10-17 LAB — CONVERTED CEMR LAB: POC INR: 2

## 2009-10-21 ENCOUNTER — Ambulatory Visit: Payer: Self-pay | Admitting: Cardiovascular Disease

## 2009-10-22 ENCOUNTER — Telehealth: Payer: Self-pay | Admitting: Cardiovascular Disease

## 2009-10-22 ENCOUNTER — Telehealth (INDEPENDENT_AMBULATORY_CARE_PROVIDER_SITE_OTHER): Payer: Self-pay | Admitting: *Deleted

## 2009-10-23 ENCOUNTER — Inpatient Hospital Stay (HOSPITAL_COMMUNITY): Admission: RE | Admit: 2009-10-23 | Discharge: 2009-10-30 | Payer: Self-pay | Admitting: General Surgery

## 2009-10-23 ENCOUNTER — Encounter: Payer: Self-pay | Admitting: Family Medicine

## 2009-10-23 HISTORY — PX: CHOLECYSTECTOMY OPEN: SUR202

## 2009-10-24 ENCOUNTER — Ambulatory Visit: Payer: Self-pay | Admitting: Internal Medicine

## 2009-10-28 ENCOUNTER — Encounter: Payer: Self-pay | Admitting: Oncology

## 2009-10-31 ENCOUNTER — Telehealth: Payer: Self-pay | Admitting: Family Medicine

## 2009-11-01 ENCOUNTER — Encounter: Payer: Self-pay | Admitting: Internal Medicine

## 2009-11-01 LAB — CONVERTED CEMR LAB
POC INR: 1.3
Prothrombin Time: 13.2 s

## 2009-11-04 ENCOUNTER — Ambulatory Visit: Payer: Self-pay | Admitting: Family Medicine

## 2009-11-04 LAB — CONVERTED CEMR LAB
Albumin: 2.8 g/dL — ABNORMAL LOW (ref 3.5–5.2)
BUN: 30 mg/dL — ABNORMAL HIGH (ref 6–23)
Calcium: 9 mg/dL (ref 8.4–10.5)
Creatinine, Ser: 1.9 mg/dL — ABNORMAL HIGH (ref 0.4–1.5)
GFR calc non Af Amer: 36.67 mL/min (ref >60)
Glucose, Bld: 107 mg/dL — ABNORMAL HIGH (ref 70–99)
Total Bilirubin: 0.4 mg/dL (ref 0.3–1.2)

## 2009-11-07 ENCOUNTER — Ambulatory Visit: Payer: Self-pay | Admitting: Family Medicine

## 2009-11-07 DIAGNOSIS — M109 Gout, unspecified: Secondary | ICD-10-CM | POA: Insufficient documentation

## 2009-11-08 LAB — CONVERTED CEMR LAB
CO2: 24 meq/L (ref 19–32)
Chloride: 107 meq/L (ref 96–112)
Creatinine, Ser: 1.7 mg/dL — ABNORMAL HIGH (ref 0.4–1.5)
GFR calc non Af Amer: 41.7 mL/min (ref >60)
Potassium: 3.5 meq/L (ref 3.5–5.1)
Sodium: 139 meq/L (ref 135–145)
Uric Acid, Serum: 8.3 mg/dL — ABNORMAL HIGH (ref 4.0–7.8)

## 2009-11-11 ENCOUNTER — Encounter: Payer: Self-pay | Admitting: Cardiovascular Disease

## 2009-11-11 LAB — CONVERTED CEMR LAB
POC INR: 1.8
Prothrombin Time: 17.5 s

## 2009-11-13 ENCOUNTER — Encounter (INDEPENDENT_AMBULATORY_CARE_PROVIDER_SITE_OTHER): Payer: Self-pay | Admitting: *Deleted

## 2009-11-13 ENCOUNTER — Encounter: Payer: Self-pay | Admitting: General Surgery

## 2009-11-14 ENCOUNTER — Ambulatory Visit: Payer: Self-pay | Admitting: Gastroenterology

## 2009-11-14 ENCOUNTER — Telehealth (INDEPENDENT_AMBULATORY_CARE_PROVIDER_SITE_OTHER): Payer: Self-pay | Admitting: *Deleted

## 2009-11-14 ENCOUNTER — Inpatient Hospital Stay (HOSPITAL_COMMUNITY): Admission: EM | Admit: 2009-11-14 | Discharge: 2009-11-19 | Payer: Self-pay | Admitting: Emergency Medicine

## 2009-11-14 DIAGNOSIS — R932 Abnormal findings on diagnostic imaging of liver and biliary tract: Secondary | ICD-10-CM

## 2009-11-14 DIAGNOSIS — R112 Nausea with vomiting, unspecified: Secondary | ICD-10-CM

## 2009-11-15 ENCOUNTER — Ambulatory Visit: Payer: Self-pay | Admitting: Gastroenterology

## 2009-11-19 ENCOUNTER — Encounter (INDEPENDENT_AMBULATORY_CARE_PROVIDER_SITE_OTHER): Payer: Self-pay | Admitting: *Deleted

## 2009-11-25 ENCOUNTER — Telehealth: Payer: Self-pay | Admitting: Family Medicine

## 2009-11-29 ENCOUNTER — Ambulatory Visit: Payer: Self-pay | Admitting: Cardiology

## 2009-11-29 ENCOUNTER — Encounter: Payer: Self-pay | Admitting: Family Medicine

## 2009-11-29 LAB — CONVERTED CEMR LAB: POC INR: 2.2

## 2009-12-06 ENCOUNTER — Encounter (INDEPENDENT_AMBULATORY_CARE_PROVIDER_SITE_OTHER): Payer: Self-pay | Admitting: Cardiology

## 2009-12-06 ENCOUNTER — Ambulatory Visit: Payer: Self-pay | Admitting: Cardiology

## 2009-12-11 ENCOUNTER — Encounter: Payer: Self-pay | Admitting: Family Medicine

## 2009-12-11 ENCOUNTER — Telehealth: Payer: Self-pay | Admitting: Family Medicine

## 2009-12-12 ENCOUNTER — Encounter: Payer: Self-pay | Admitting: Family Medicine

## 2009-12-17 ENCOUNTER — Ambulatory Visit: Payer: Self-pay | Admitting: Gastroenterology

## 2009-12-17 LAB — CONVERTED CEMR LAB
Bilirubin, Direct: 0.1 mg/dL (ref 0.0–0.3)
Total Bilirubin: 0.5 mg/dL (ref 0.3–1.2)

## 2009-12-27 ENCOUNTER — Ambulatory Visit: Payer: Self-pay | Admitting: Internal Medicine

## 2009-12-27 LAB — CONVERTED CEMR LAB: POC INR: 2.1

## 2010-01-10 ENCOUNTER — Encounter: Payer: Self-pay | Admitting: Family Medicine

## 2010-01-23 ENCOUNTER — Ambulatory Visit: Payer: Self-pay | Admitting: Cardiology

## 2010-01-23 LAB — CONVERTED CEMR LAB: POC INR: 2

## 2010-01-27 ENCOUNTER — Telehealth: Payer: Self-pay | Admitting: Family Medicine

## 2010-02-06 ENCOUNTER — Encounter: Payer: Self-pay | Admitting: Family Medicine

## 2010-02-25 ENCOUNTER — Ambulatory Visit: Payer: Self-pay | Admitting: Internal Medicine

## 2010-02-25 LAB — CONVERTED CEMR LAB: POC INR: 1.7

## 2010-02-26 ENCOUNTER — Ambulatory Visit: Payer: Self-pay | Admitting: Family Medicine

## 2010-03-04 ENCOUNTER — Encounter: Payer: Self-pay | Admitting: Family Medicine

## 2010-03-05 ENCOUNTER — Encounter: Payer: Self-pay | Admitting: Family Medicine

## 2010-03-05 ENCOUNTER — Telehealth: Payer: Self-pay | Admitting: Family Medicine

## 2010-03-12 ENCOUNTER — Ambulatory Visit: Payer: Self-pay | Admitting: Family Medicine

## 2010-03-12 LAB — CONVERTED CEMR LAB: Vitamin B-12: 699 pg/mL (ref 211–911)

## 2010-03-18 ENCOUNTER — Ambulatory Visit: Payer: Self-pay | Admitting: Cardiovascular Disease

## 2010-03-19 ENCOUNTER — Ambulatory Visit: Payer: Self-pay | Admitting: Family Medicine

## 2010-03-31 ENCOUNTER — Ambulatory Visit: Payer: Self-pay | Admitting: Cardiology

## 2010-03-31 LAB — CONVERTED CEMR LAB: POC INR: 1.3

## 2010-04-17 ENCOUNTER — Ambulatory Visit: Payer: Self-pay | Admitting: Internal Medicine

## 2010-04-17 LAB — CONVERTED CEMR LAB: POC INR: 1.3

## 2010-04-28 ENCOUNTER — Ambulatory Visit: Payer: Self-pay | Admitting: Cardiology

## 2010-05-08 ENCOUNTER — Ambulatory Visit: Payer: Self-pay | Admitting: Cardiovascular Disease

## 2010-05-12 ENCOUNTER — Encounter: Payer: Self-pay | Admitting: Family Medicine

## 2010-05-12 ENCOUNTER — Encounter (INDEPENDENT_AMBULATORY_CARE_PROVIDER_SITE_OTHER): Payer: Self-pay | Admitting: *Deleted

## 2010-05-13 ENCOUNTER — Ambulatory Visit: Payer: Self-pay | Admitting: Family Medicine

## 2010-05-13 IMAGING — RF DG ERCP WO/W SPHINCTEROTOMY
1 series · 4 of 4 positions shown · non-contrast
Comparison: None.

CLINICAL DATA: Right upper quadrant pain.

ERCP
TECHNIQUE: Multiple spot images obtained with the fluoroscopic
device and submitted for interpretation post-procedure.  ERCP was
performed by Dr. An Di.

[Series 1: run · 4 of 4 slices shown]
[im 1/4]
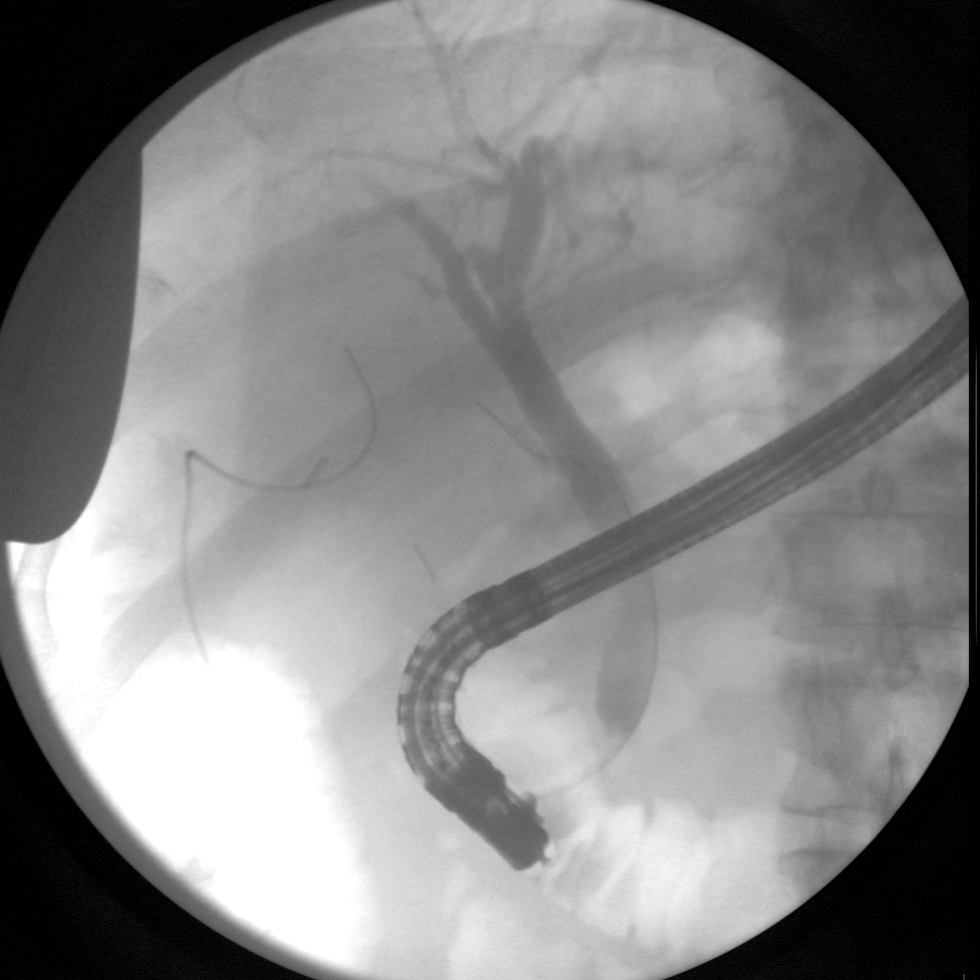
[im 2/4]
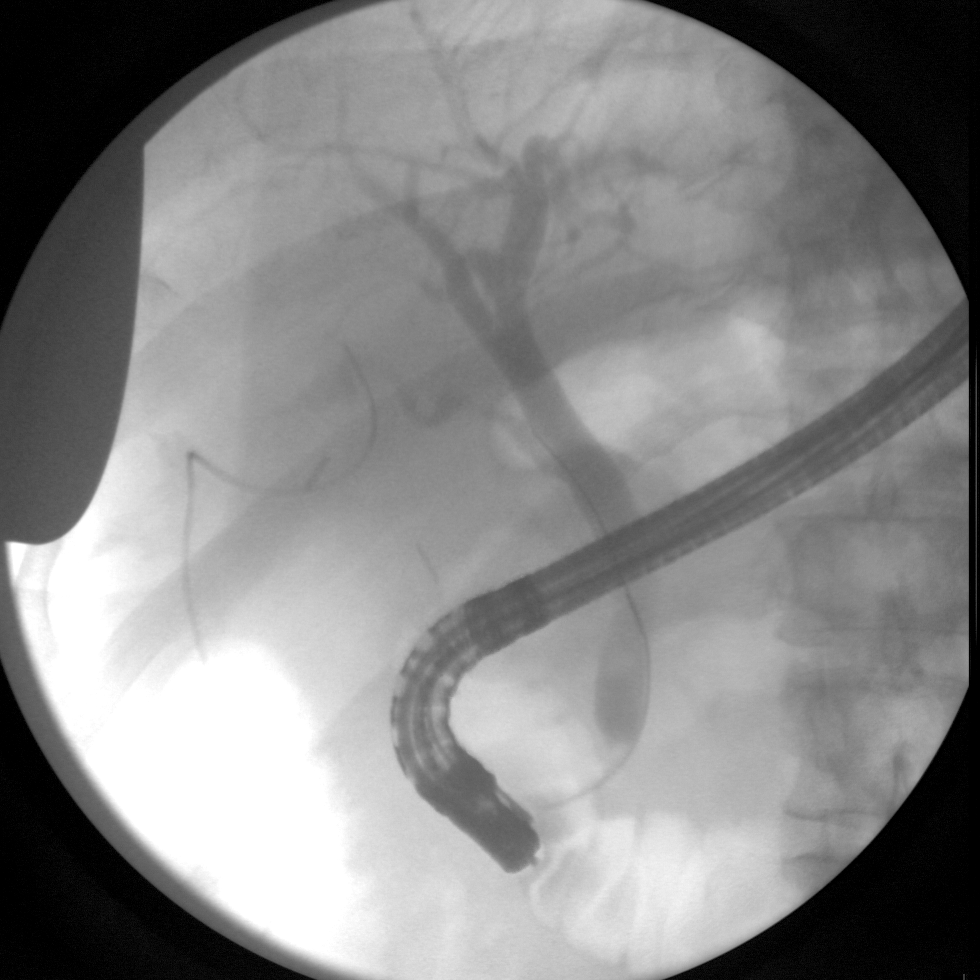
[im 3/4]
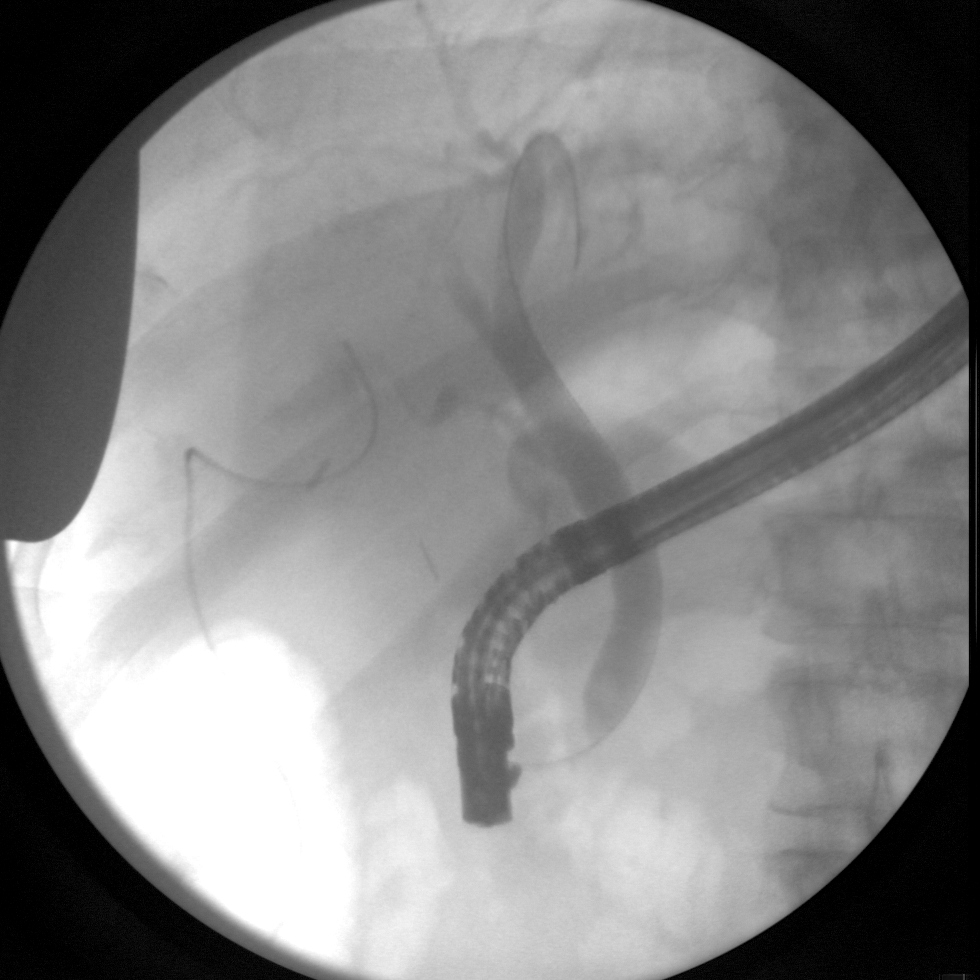
[im 4/4]
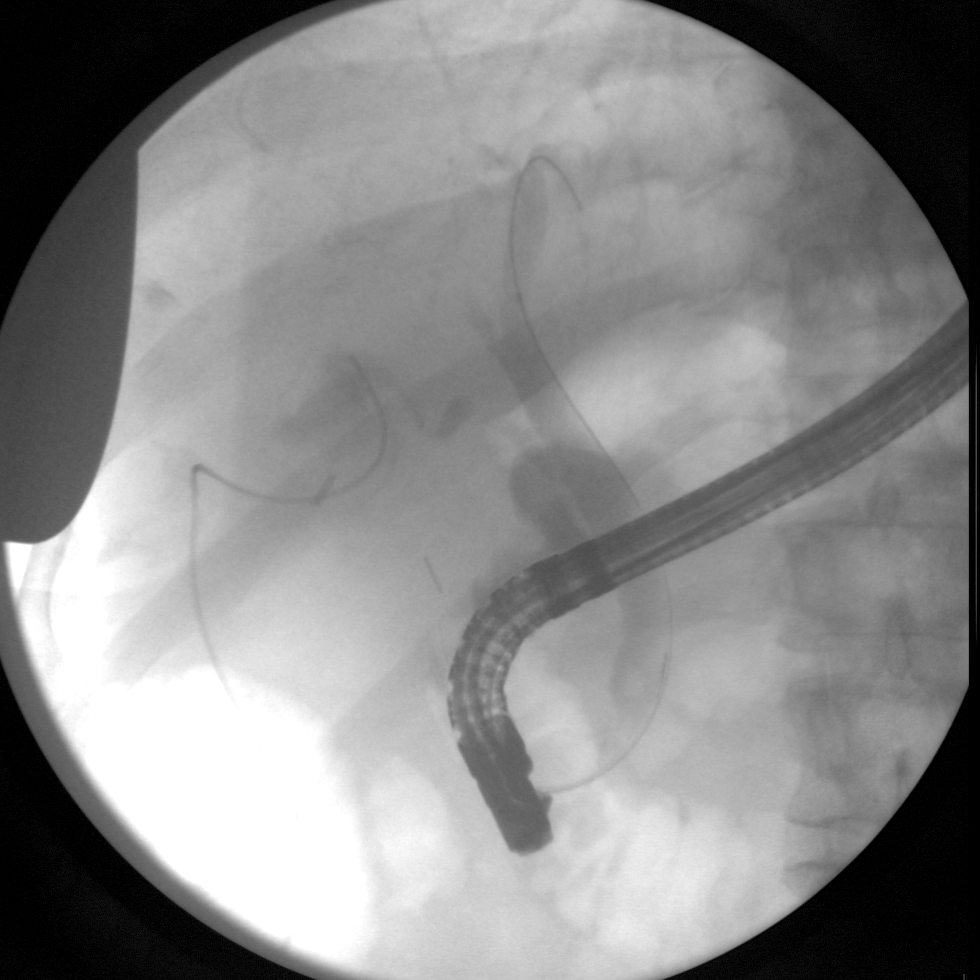

[4 of 4 positions shown; findings below may reference images not displayed]

FINDINGS: We are provided with four fluoroscopic spot views from
ERCP.  Images demonstrate passage of a wire of the common bile
duct.  Injection of contrast material demonstrates dilatation of
the common bile duct.  Lucency in the distal common bile duct may
be due to a stone.  No leakage of contrast material identified.
IMPRESSION: ERCP demonstrating common bile duct dilatation and possible stone
at the ampulla.

These images were submitted for radiologic interpretation only.
Please see the procedural report for the amount of contrast and the
fluoroscopy time utilized.

## 2010-05-14 LAB — CONVERTED CEMR LAB
AST: 22 units/L (ref 0–37)
Albumin: 3.8 g/dL (ref 3.5–5.2)
BUN: 26 mg/dL — ABNORMAL HIGH (ref 6–23)
Basophils Absolute: 0.1 10*3/uL (ref 0.0–0.1)
CO2: 25 meq/L (ref 19–32)
Calcium: 9 mg/dL (ref 8.4–10.5)
Eosinophils Relative: 1.9 % (ref 0.0–5.0)
Folate: 7.2 ng/mL
GFR calc non Af Amer: 50.43 mL/min (ref >60)
Glucose, Bld: 170 mg/dL — ABNORMAL HIGH (ref 70–99)
HCT: 38.7 % — ABNORMAL LOW (ref 39.0–52.0)
Hemoglobin: 13.3 g/dL (ref 13.0–17.0)
Hgb A1c MFr Bld: 8.5 % — ABNORMAL HIGH (ref 4.6–6.5)
Lymphocytes Relative: 24.8 % (ref 12.0–46.0)
Lymphs Abs: 2.3 10*3/uL (ref 0.7–4.0)
Monocytes Relative: 7.9 % (ref 3.0–12.0)
Platelets: 195 10*3/uL (ref 150.0–400.0)
RDW: 14.1 % (ref 11.5–14.6)
TSH: 5.41 microintl units/mL (ref 0.35–5.50)
Total Protein: 6.7 g/dL (ref 6.0–8.3)
WBC: 9.4 10*3/uL (ref 4.5–10.5)

## 2010-05-14 IMAGING — CT CT ABD-PELV W/ CM
1 of 3 series · 14 of 32 positions shown, 18 images · IV contrast (APPLIED)
Comparison: 10/08/2009.

CLINICAL DATA: Mid abdominal pain.  Bile leak.  Evaluate for
abscess.

CT ABDOMEN AND PELVIS WITH CONTRAST
TECHNIQUE: Multidetector CT imaging of the abdomen and pelvis was
performed following the standard protocol during bolus
administration of intravenous contrast.
Contrast: 100 ml [id number].

[Series 6: abd/pelv with 5.0 b31f st · axial · 0.76mm/px · z∈[-582,-142]mm · 14 of 98 slices shown, 18 images]
[im 5/98  soft-tissue]
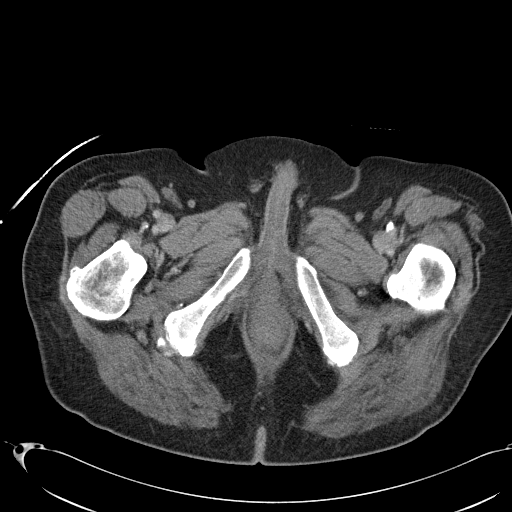
[im 5/98  bone]
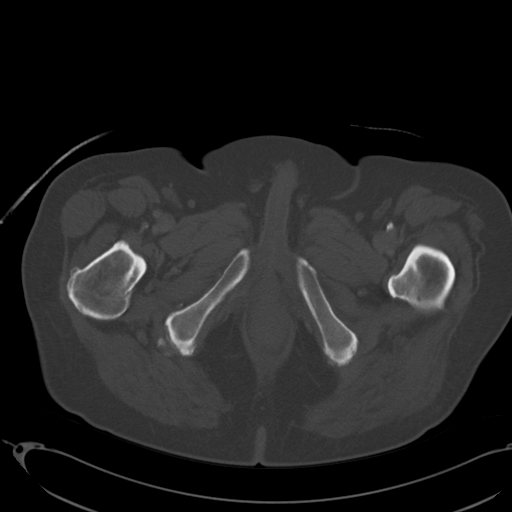
[im 14/98  soft-tissue]
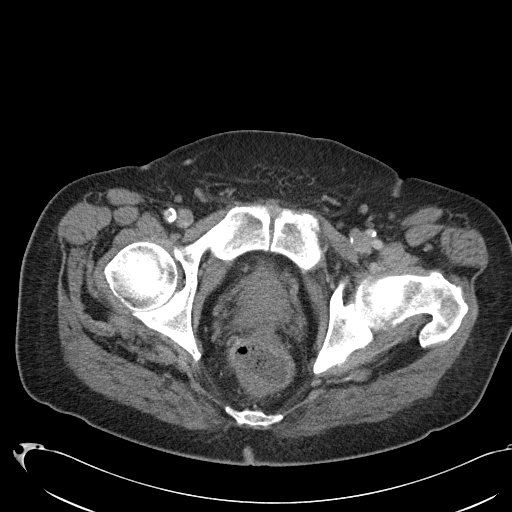
[im 23/98  soft-tissue]
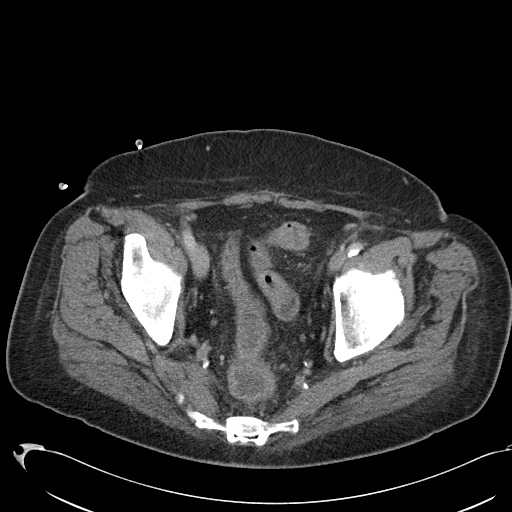
[im 31/98  soft-tissue]
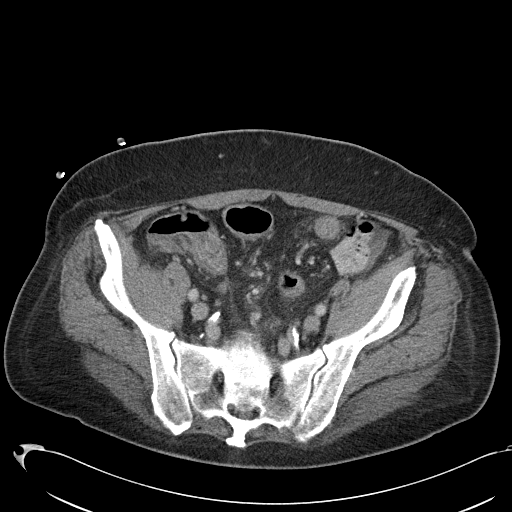
[im 36/98  soft-tissue]
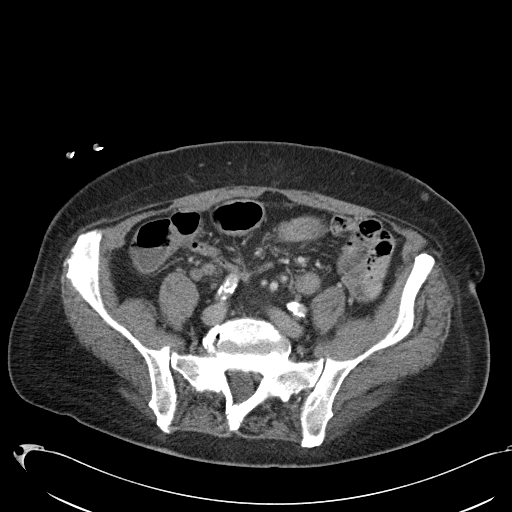
[im 45/98  soft-tissue]
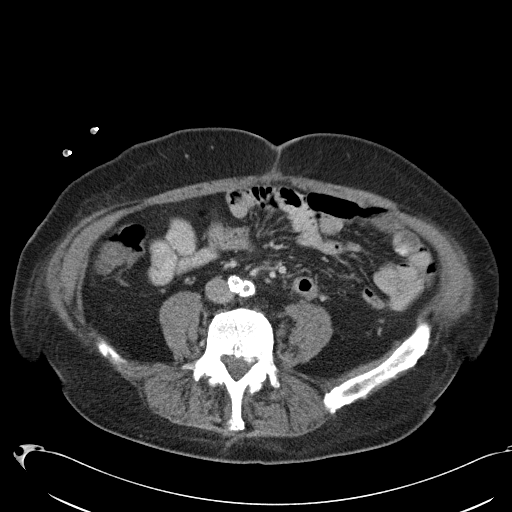
[im 53/98  soft-tissue]
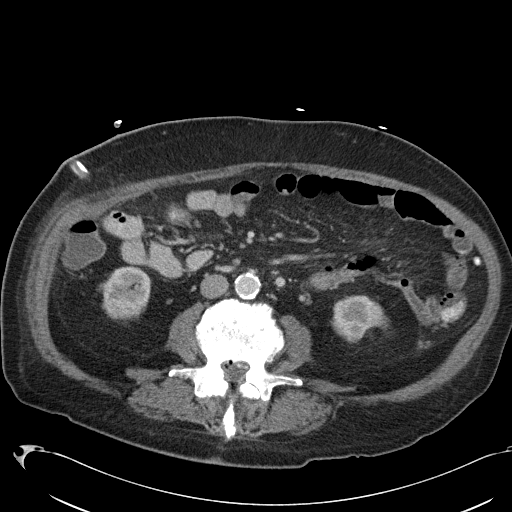
[im 62/98  soft-tissue]
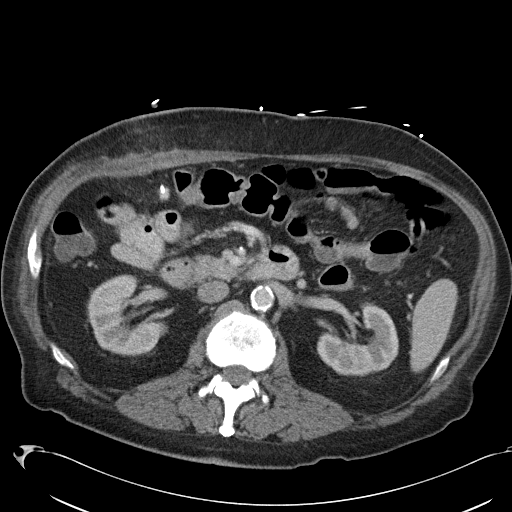
[im 67/98  soft-tissue]
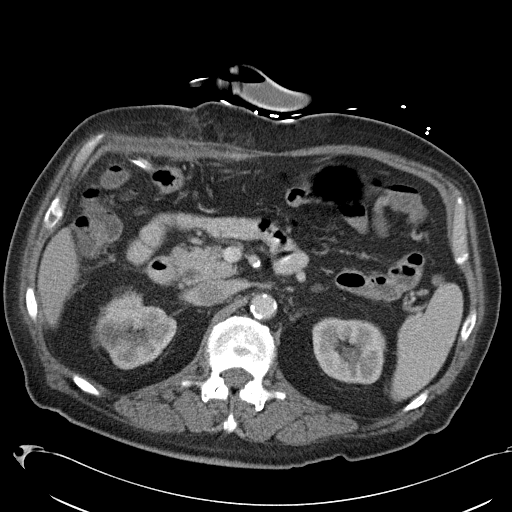
[im 67/98  bone]
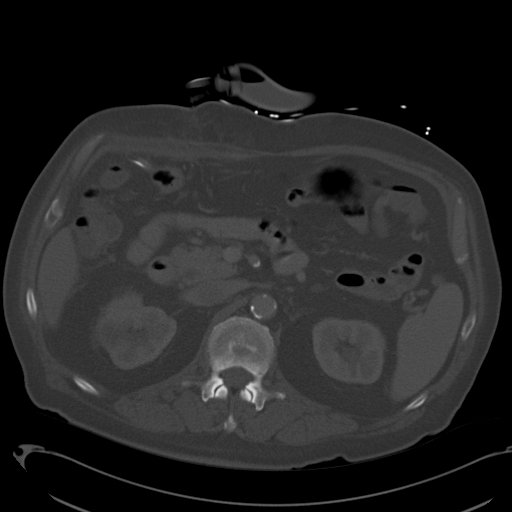
[im 75/98  soft-tissue]
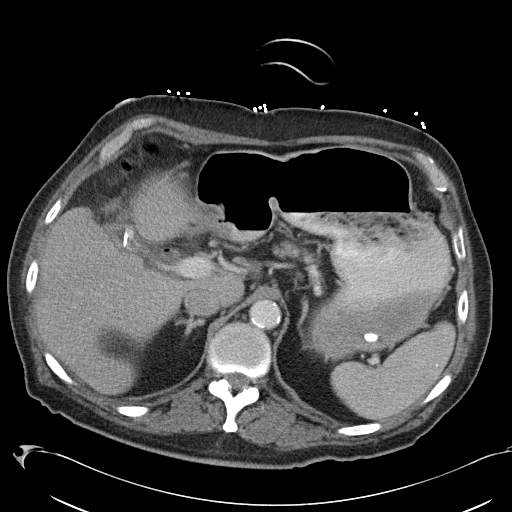
[im 80/98  lung]
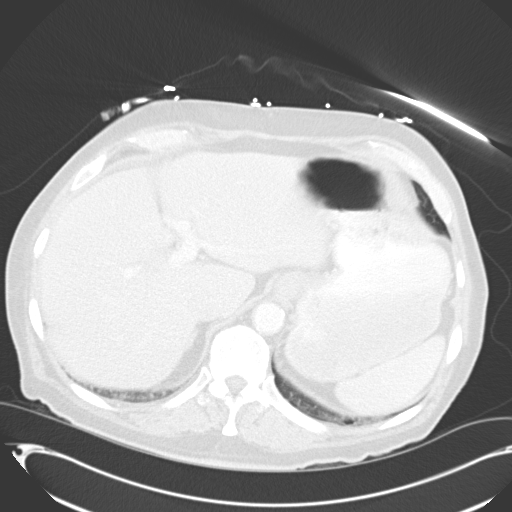
[im 84/98  soft-tissue]
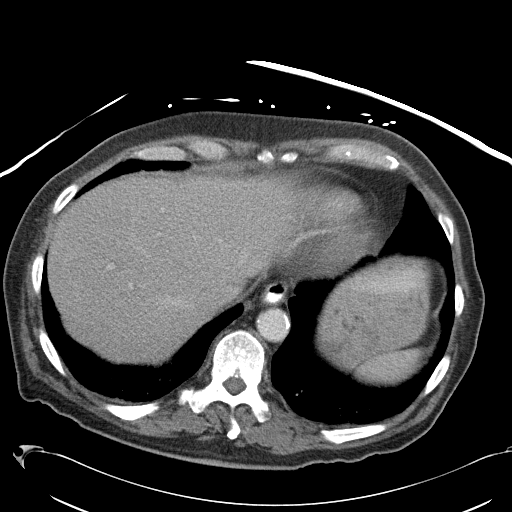
[im 84/98  lung]
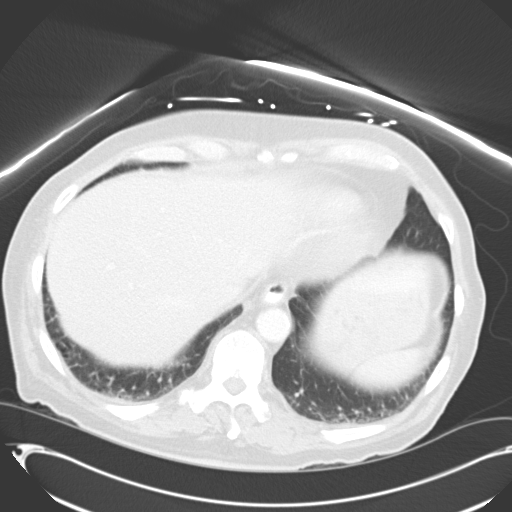
[im 89/98  lung]
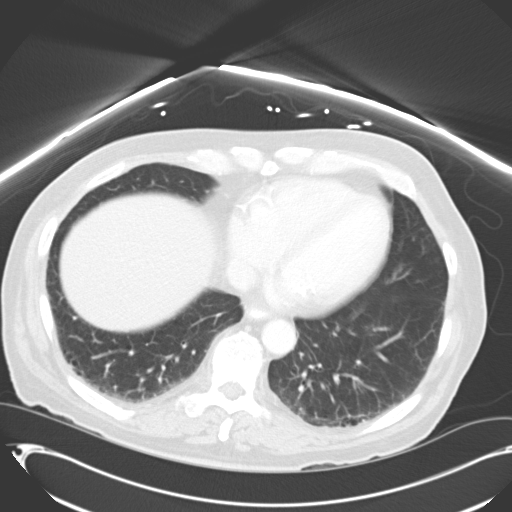
[im 93/98  soft-tissue]
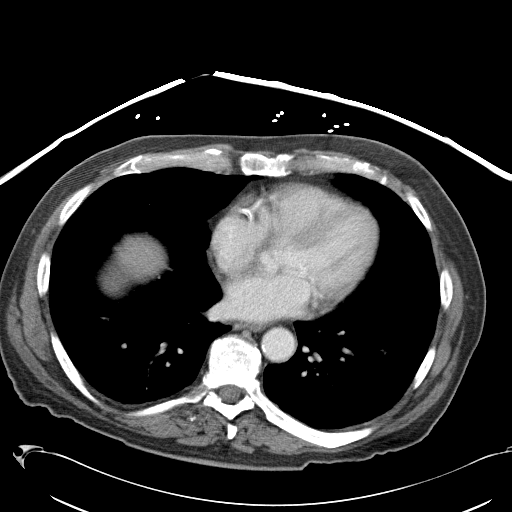
[im 93/98  lung]
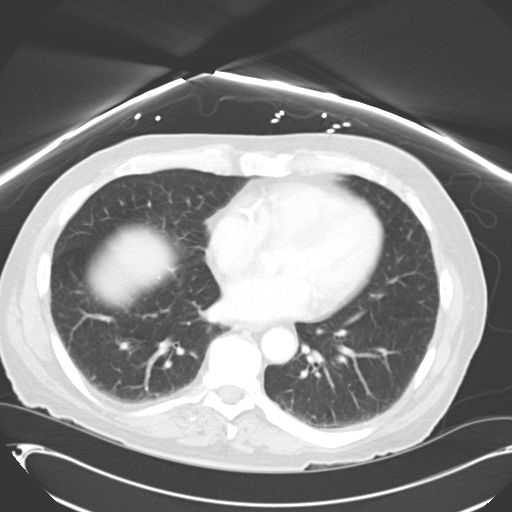

[14 of 32 positions shown; findings below may reference images not displayed]

FINDINGS: Status post cholecystectomy.  Surgical drain within the
gallbladder fossa.  There remains a small amount of fluid within
the gallbladder fossa (consistent with postoperative leak) without
a large abscess.  Minimal perihepatic fluid.

Slight prominence of the common bile duct measuring up to 8.8 mm.
This may be related to the patient's age and post cholecystectomy
state.  The small amount of air within the common hepatic duct and
intrahepatic biliary ducts most likely related to the recent ERCP.
No calcified common bile duct stone or pancreatic masses
identified.

Nonspecific 8.2 mm low density lesion right lobe liver (series 6
image 18).  Right renal 4.5 cm cyst.  Minimal nodularity left
adrenal gland.  Small calcified granuloma within the kidney.

Atherosclerotic type changes with prominent coronary artery
calcification and calcification with ectasia of the abdominal aorta
and branch vessels.

The appendix appears prominent in size measuring up to 7.9 mm
versus prior 7.2 mm.  There is minimal haziness near the base of
the appendix.  This may be normal for this patient however there
are progressive symptoms close follow-up imaging with attention to
is recommended.  There is also slight thickening of the under
distended rectosigmoid region which may be related to the under
distension although mild colitis cannot be completely excluded.  No
extraluminal inflammation.

Slight asymmetric appearance of prostate gland.  Prominent
degenerative changes of the lumbar spine.
IMPRESSION: Small amount of fluid remains within the gallbladder fossa with
drain in place but without large abscess.  Findings are consistent
with postoperative leak.  Minimal amount of perihepatic fluid.

Appendix minimally in prominent size.  Close attention to this on
follow-up if there are progressive symptoms.

Mild thickening of under distended rectosigmoid region.  No
surrounding extraluminal inflammation although mild colitis type
changes cannot be completely excluded.

Atherosclerotic type changes coronary arteries and aorta and branch
vessels.

Slight asymmetry of the prostate gland.

This has been made a call report.

## 2010-05-15 ENCOUNTER — Encounter: Payer: Self-pay | Admitting: Family Medicine

## 2010-05-21 ENCOUNTER — Encounter (INDEPENDENT_AMBULATORY_CARE_PROVIDER_SITE_OTHER): Payer: Self-pay | Admitting: *Deleted

## 2010-05-22 ENCOUNTER — Ambulatory Visit: Payer: Self-pay | Admitting: Cardiovascular Disease

## 2010-05-22 LAB — CONVERTED CEMR LAB: POC INR: 2.3

## 2010-05-29 ENCOUNTER — Encounter (INDEPENDENT_AMBULATORY_CARE_PROVIDER_SITE_OTHER): Payer: Self-pay | Admitting: *Deleted

## 2010-06-04 ENCOUNTER — Telehealth: Payer: Self-pay | Admitting: Family Medicine

## 2010-06-05 ENCOUNTER — Ambulatory Visit: Payer: Self-pay | Admitting: Internal Medicine

## 2010-06-05 LAB — CONVERTED CEMR LAB: POC INR: 1.9

## 2010-06-13 ENCOUNTER — Encounter: Payer: Self-pay | Admitting: Family Medicine

## 2010-06-19 ENCOUNTER — Ambulatory Visit: Payer: Self-pay | Admitting: Cardiology

## 2010-06-30 ENCOUNTER — Ambulatory Visit: Payer: Self-pay | Admitting: Family Medicine

## 2010-07-03 ENCOUNTER — Ambulatory Visit: Payer: Self-pay | Admitting: Family Medicine

## 2010-07-04 ENCOUNTER — Encounter: Payer: Self-pay | Admitting: Family Medicine

## 2010-07-07 ENCOUNTER — Encounter: Payer: Self-pay | Admitting: Family Medicine

## 2010-07-10 ENCOUNTER — Ambulatory Visit: Payer: Self-pay | Admitting: Internal Medicine

## 2010-07-10 ENCOUNTER — Telehealth: Payer: Self-pay | Admitting: Family Medicine

## 2010-07-10 ENCOUNTER — Encounter: Payer: Self-pay | Admitting: Family Medicine

## 2010-07-10 LAB — CONVERTED CEMR LAB: POC INR: 3

## 2010-07-14 ENCOUNTER — Encounter: Payer: Self-pay | Admitting: Family Medicine

## 2010-08-07 ENCOUNTER — Ambulatory Visit: Payer: Self-pay | Admitting: Cardiology

## 2010-08-25 ENCOUNTER — Ambulatory Visit: Payer: Self-pay | Admitting: Cardiovascular Disease

## 2010-08-25 ENCOUNTER — Ambulatory Visit: Payer: Self-pay | Admitting: Cardiology

## 2010-08-25 LAB — CONVERTED CEMR LAB
BUN: 26 mg/dL — ABNORMAL HIGH (ref 6–23)
CO2: 21 meq/L (ref 19–32)
Chloride: 109 meq/L (ref 96–112)
GFR calc non Af Amer: 45.61 mL/min (ref >60)
Glucose, Bld: 157 mg/dL — ABNORMAL HIGH (ref 70–99)
Potassium: 3.8 meq/L (ref 3.5–5.1)
Sodium: 142 meq/L (ref 135–145)

## 2010-09-05 ENCOUNTER — Ambulatory Visit: Payer: Self-pay | Admitting: Cardiovascular Disease

## 2010-09-12 ENCOUNTER — Encounter: Payer: Self-pay | Admitting: Family Medicine

## 2010-09-19 ENCOUNTER — Ambulatory Visit: Payer: Self-pay | Admitting: Cardiology

## 2010-09-30 ENCOUNTER — Ambulatory Visit: Payer: Self-pay | Admitting: Family Medicine

## 2010-10-01 LAB — CONVERTED CEMR LAB: Hgb A1c MFr Bld: 7.9 % — ABNORMAL HIGH (ref 4.6–6.5)

## 2010-10-03 ENCOUNTER — Encounter: Payer: Self-pay | Admitting: Family Medicine

## 2010-10-03 ENCOUNTER — Ambulatory Visit: Payer: Self-pay | Admitting: Family Medicine

## 2010-10-03 DIAGNOSIS — R5381 Other malaise: Secondary | ICD-10-CM

## 2010-10-03 DIAGNOSIS — R5383 Other fatigue: Secondary | ICD-10-CM

## 2010-10-03 LAB — HM DIABETES FOOT EXAM: HM Diabetic Foot Exam: NORMAL

## 2010-10-07 ENCOUNTER — Encounter: Payer: Self-pay | Admitting: Family Medicine

## 2010-10-07 LAB — CONVERTED CEMR LAB
Eosinophils Relative: 1 % (ref 0–5)
HCT: 38.5 % — ABNORMAL LOW (ref 39.0–52.0)
Hemoglobin: 13.4 g/dL (ref 13.0–17.0)
Lymphocytes Relative: 27 % (ref 12–46)
Lymphs Abs: 2.5 10*3/uL (ref 0.7–4.0)
Monocytes Absolute: 1 10*3/uL (ref 0.1–1.0)
RBC: 4.09 M/uL — ABNORMAL LOW (ref 4.22–5.81)
TSH: 6.324 microintl units/mL — ABNORMAL HIGH (ref 0.350–4.500)
WBC: 9.4 10*3/uL (ref 4.0–10.5)

## 2010-10-09 ENCOUNTER — Encounter: Payer: Self-pay | Admitting: Family Medicine

## 2010-10-12 ENCOUNTER — Telehealth: Payer: Self-pay | Admitting: Family Medicine

## 2010-10-12 ENCOUNTER — Encounter: Payer: Self-pay | Admitting: Family Medicine

## 2010-10-13 ENCOUNTER — Encounter (INDEPENDENT_AMBULATORY_CARE_PROVIDER_SITE_OTHER): Payer: Self-pay | Admitting: *Deleted

## 2010-10-15 ENCOUNTER — Telehealth (INDEPENDENT_AMBULATORY_CARE_PROVIDER_SITE_OTHER): Payer: Self-pay | Admitting: *Deleted

## 2010-10-17 ENCOUNTER — Ambulatory Visit: Admission: RE | Admit: 2010-10-17 | Discharge: 2010-10-17 | Payer: Self-pay | Source: Home / Self Care

## 2010-10-17 ENCOUNTER — Telehealth: Payer: Self-pay | Admitting: Family Medicine

## 2010-10-28 ENCOUNTER — Encounter: Payer: Self-pay | Admitting: Family Medicine

## 2010-11-04 NOTE — Progress Notes (Signed)
Summary: [employer] denial of Humulin  Phone Note From Pharmacy Call back at [phone]   Caller: [facility] [address] Call For: Dr. Hetty Ely  Summary of Call: Received request from pharmacy to get prior auth from [employer] for Humulin 70/30.  [employer] and information was given. Was informed by Lelon Mast at [phone]  that they can not approve this medication and that she will be sending this for medication review and would be sending paperwork for doctor to review and possibly changed it to a medication on their list.  Was told that the paperwork called [employer] should be received within the next 48 hours.  Paperwork is in your in box. Initial call taken by: Sydell Axon LPN,  October 07, 2009 4:33 PM  Follow-up for Phone Call        Substitute Novo 70/30 35 units two times a day 3vials, 12 refills. Follow-up by: Shaune Leeks MD,  October 23, 2009 7:54 AM  Additional Follow-up for Phone Call Additional follow up Details #1::        Forms faxed to [facility], [phone] and faxed to [facility] [phone].  Changed on medication list. Additional Follow-up by: Linde Gillis CMA Duncan Dull),  October 23, 2009 8:14 AM     Appended Document: [employer] denial of Humulin Received faxed form from [facility] stating that Novolin 70/30 insulin does not require precertification or has been authorized previousley.    Appended Document: [employer] denial of Humulin Form faxed to [employer]

## 2010-11-04 NOTE — Letter (Signed)
Summary: Appt Reminder 2  [facility]  [address] [place], [place] [address]   Phone: [phone]  Fax: [phone]        November 19, 2009 MRN: [id number]    Zeek ZIDEK [address] [place], [place]  [address]    Dear Mr. Lapka,   You have a return appointment with Dr. Christella Hartigan on 12/17/09 at 10:15am.  Please remember to bring a complete list of the medicines you are taking, your insurance card and your co-pay.  If you have to cancel or reschedule this appointment, please call before 5:00 pm the evening before to avoid a cancellation fee.  If you have any questions or concerns, please call [phone].    Sincerely,    Chales Abrahams CMA (AAMA)  Appended Document: Appt Reminder 2 letter mailed

## 2010-11-04 NOTE — Progress Notes (Signed)
Summary: Rx Pletal  Phone Note Refill Request Call back at [phone] Message from:  [facility] on January 27, 2010 8:29 AM  Refills Requested: Medication #1:  PLETAL 100 MG TABS one tab by mouth twice a day Received e-scribe refill request   Method Requested: Electronic Initial call taken by: Sydell Axon LPN,  January 27, 2010 8:30 AM  Follow-up for Phone Call        Pro Time followed carefully at Cardiology so assume risk of concomittant Pletal acceptable at present. Pt has strong H/o PVD. Will continue for now with low threshold for stopping if bleeding becomes a problem. Follow-up by: Shaune Leeks MD,  January 27, 2010 8:57 AM    Prescriptions: PLETAL 100 MG TABS (CILOSTAZOL) one tab by mouth twice a day  #60 x 3   Entered and Authorized by:   Shaune Leeks MD   Signed by:   Shaune Leeks MD on 01/27/2010   Method used:   Electronically to        [facility] [address] (retail)       [address]       [place], [place]  [id number]       Ph: [phone]       Fax: [phone]   RxID:   [id number]

## 2010-11-04 NOTE — Progress Notes (Signed)
Summary: Rx Pletal  Phone Note Refill Request Call back at [phone] Message from:  [facility] on November 25, 2009 11:05 AM  Refills Requested: Medication #1:  PLETAL 100 MG TABS one tab by mouth twice a day   Last Refilled: 10/14/2009 Received e-scribe refill request   Method Requested: Electronic Initial call taken by: Sydell Axon LPN,  November 25, 2009 11:06 AM    Prescriptions: PLETAL 100 MG TABS (CILOSTAZOL) one tab by mouth twice a day  #60 x 1   Entered and Authorized by:   Shaune Leeks MD   Signed by:   Shaune Leeks MD on 11/25/2009   Method used:   Electronically to        [facility] (retail)       [address]       [place], [place]  [id number]       Ph: [phone]       Fax: [phone]   RxID:   [id number]

## 2010-11-04 NOTE — Assessment & Plan Note (Signed)
Summary: 3 WEEK FOLLOW UP/[facility]   Vital Signs:  Patient profile:   77 year old male Weight:      205.75 pounds Temp:     97.7 degrees F oral Pulse rate:   92 / minute Pulse rhythm:   regular BP sitting:   120 / 60  (left arm) Cuff size:   large  Vitals Entered By: Sydell Axon LPN (March 19, 2010 3:29 PM) CC: 3 Week follow-up after labs   History of Present Illness: Pt here for followup. He still feels slightly sluggish but sugars vary a lot. At Dr Marland Mcalpine was 300. His diary has an 16! He admits to sometimes eating things he shouldn't. We started him on Levemir last visit and he has had no problems.  He still has some nasal congestion but thinks Singulair has helped. He was given samps and now needs script which sounds like might help him.  He was given good report at Dr Marland Mcalpine, kidney function stable.  He comntinues with oral replacement of B12 and is here to recheck that as well.  Allergies: 1)  ! Codeine Sulfate (Codeine Sulfate)  Physical Exam  General:  Well-developed,well-nourished,in mild  distress from presumed fatigue; alert,appropriate and cooperative throughout examination, obese. Head:  Normocephalic and atraumatic without obvious abnormalities. No apparent alopecia or balding, is having thinning of hair. Sinuses NT. Eyes:  Conjunctiva mildly inflamed  bilaterallyin palpebral distrib, good tearing.  Ears:  External ear exam shows no significant lesions or deformities.  Otoscopic examination reveals clear canals, tympanic membranes are intact bilaterally without bulging, retraction, inflammation or discharge. Hearing is grossly normal bilaterally. Signif cerumen bilat. Nose:  External nasal examination shows no deformity or inflammation. Nasal mucosa are pink and moist without lesions or exudates but mild inflammation.. Minimal inflammation. Mouth:  MMM Neck:  No deformities, masses, or tenderness noted. Lungs:  Normal respiratory effort, chest expands symmetrically. Lungs  are clear to auscultation, no crackles or wheezes. Heart:  Normal rate and regular rhythm. S1 and S2 normal without gallop, murmur, click, rub or other extra sounds.   Impression & Recommendations:  Problem # 1:  DM (ICD-250.00) Assessment Unchanged Needs improvement . Diary bounces around but typically mid 200s. Increase Levemir to 8 units. His updated medication list for this problem includes:    Glyburide Micronized 6 Mg Tabs (Glyburide micronized) .Marland Kitchen... 1 by mouth two times a day    Novolog 100 Unit/ml Soln (Insulin aspart) .Marland KitchenMarland KitchenMarland KitchenMarland Kitchen 35 units every am, 35 units every pm    Levemir 100 Unit/ml Soln (Insulin detemir) .Marland Kitchen... 8 units subcut daily  Orders: Administration Flu vaccine - MCR (G0008)  Labs Reviewed: Creat: 1.7 (11/07/2009)   Microalbumin: 3.9 (02/02/2006)  Last Eye Exam: normal (03/20/2008) Reviewed HgBA1c results: 10.1 (09/25/2009)  10.2 (06/19/2009)  Problem # 2:  B12 DEFICIENCY (ICD-266.2) Assessment: Improved Level adequate, continue B12 orally.  Problem # 3:  RENAL FAILURE, ACUTE (ICD-584.9) Assessment: Improved Stable, actually doing better. F/U per Dr [name].  Problem # 4:  ALLERGIC RHINITIS (ICD-477.9) Assessment: Unchanged Cont Singulair in addituion to Nasonex, breathing slightly better, clinicallyy looks the same. His updated medication list for this problem includes:    Nasonex 50 Mcg/act Susp (Mometasone furoate) ..... Once daily  Problem # 5:  HYPERTENSION (ICD-401.9) Assessment: Unchanged  His updated medication list for this problem includes:    Carvedilol 25 Mg Tabs (Carvedilol) .Marland Kitchen... 1 tablet twice a day by mouth    Amlodipine Besylate 5 Mg Tabs (Amlodipine besylate) .Marland Kitchen... Take one tablet  by mouth daily  BP today: 120/60 Prior BP: 138/68 (02/26/2010)  Labs Reviewed: K+: 3.5 (11/07/2009) Creat: : 1.7 (11/07/2009)   Chol: 148 (10/14/2007)   HDL: 33.4 (10/14/2007)   LDL: 83 (10/14/2007)   TG: 159 (10/14/2007)  Complete Medication List: 1)   Avodart 0.5 Mg Caps (Dutasteride) .... Take 1 capsule once a day 2)  Glyburide Micronized 6 Mg Tabs (Glyburide micronized) .Marland Kitchen.. 1 by mouth two times a day 3)  Levothyroxine Sodium 25 Mcg Tabs (Levothyroxine sodium) .Marland Kitchen.. 1 by mouth daily 4)  Lipitor 80 Mg Tabs (Atorvastatin calcium) .... One tab by mouth at night. 5)  Digoxin 0.125 Mg Tabs (Digoxin) .Marland Kitchen.. 1 tab once daily 6)  Nitrostat 0.3 Mg Subl (Nitroglycerin) .... As needed 7)  Ferro-bob 325 (65 Fe) Mg Tabs (Ferrous sulfate) .Marland Kitchen.. 1 by mouths a day 8)  Bd Insulin Syringe Ultrafine 31g X 5/16" 0.5 Ml Misc (Insulin syringe-needle u-100) .... Use twice a day 9)  Tylenol Extra Strength 500 Mg Tabs (Acetaminophen) .... As needed pain usually helps except at night 10)  Carvedilol 25 Mg Tabs (Carvedilol) .Marland Kitchen.. 1 tablet twice a day by mouth 11)  Tramadol Hcl 50 Mg Tabs (Tramadol hcl) .Marland Kitchen.. 1 every 4 to 6 hours as needed pain 12)  Pletal 100 Mg Tabs (Cilostazol) .... One tab by mouth twice a day 13)  Vitamin B-12 1000 Mcg Tabs (Cyanocobalamin) .... Take  1 tablet daily 14)  Nasonex 50 Mcg/act Susp (Mometasone furoate) .... Once daily 15)  Mucus Relief 400 Mg Tabs (Guaifenesin) .... As needed 16)  Amlodipine Besylate 5 Mg Tabs (Amlodipine besylate) .... Take one tablet by mouth daily 17)  Novolog 100 Unit/ml Soln (Insulin aspart) .... 35 units every am, 35 units every pm 18)  Oxycodone-acetaminophen 5-500 Mg Caps (Oxycodone-acetaminophen) .... As needed 19)  Levemir 100 Unit/ml Soln (Insulin detemir) .... 8 units subcut daily 20)  Warfarin Sodium 5 Mg Tabs (Warfarin sodium) .... Use as directed by anticoagulation clinic 21)  Singulair 10 Mg Tabs (Montelukast sodium) .... Take 1 tablet everyday  Patient Instructions: 1)  RTC 3 mos with Dr Reece Agar or Dr D, with A1C prior.  Prescriptions: SINGULAIR 10 MG TABS (MONTELUKAST SODIUM) Take 1 tablet everyday  #30 x 12   Entered and Authorized by:   Shaune Leeks MD   Signed by:   Shaune Leeks MD on  03/19/2010   Method used:   Electronically to        [facility] (retail)       [address]       [place], [place]  [id number]       Ph: [phone]       Fax: [phone]   RxID:   [id number]   Current Allergies (reviewed today): ! CODEINE SULFATE (CODEINE SULFATE)

## 2010-11-04 NOTE — Assessment & Plan Note (Signed)
Summary: surgical clearance  Medications Added BAYER LOW STRENGTH 81 MG  TBEC (ASPIRIN) 1 daily by mouth ON HOLD COUMADIN 5 MG TABS (WARFARIN SODIUM) Take as directed by coumadin clinic. ON HOLD        Visit Type:  Follow-up Primary Provider:  [name], MD  CC:  Surgical clearance- Gallbladder.  History of Present Illness: 77 year-old male with history of CAD s/p remote inferior MI and more recent stenting of the RCA in 2004. He has been stable from a CV perspective since that time. The patient has paroxysmal atrial fibrillation and is maintained on chronic anticoagulation with coumadin. He presents today for preoperative cardiovascular assessment before proceeding wtih cholecystectomy. He has had abdominal discomfort and is scheduled for lap cholecystectomy on 1/19.  The patient denies chest pain, orthopnea, PND, edema, palpitations, lightheadedness, or syncope.  He admits to chronic dyspnea with exertion but this unchanged over several years.   Current Medications (verified): 1)  Avodart 0.5 Mg Caps (Dutasteride) .... Take 1 Capsule Once A Day 2)  Glyburide Micronized 6 Mg Tabs (Glyburide Micronized) .Marland Kitchen.. 1 By Mouth Two Times A Day 3)  Levothyroxine Sodium 25 Mcg Tabs (Levothyroxine Sodium) .Marland Kitchen.. 1 By Mouth Daily 4)  Lipitor 80 Mg  Tabs (Atorvastatin Calcium) .... One Tab By Mouth At Night. 5)  Digoxin 0.125 Mg Tabs (Digoxin) .Marland Kitchen.. 1 Tab Once Daily 6)  Prilosec 20 Mg Cpdr (Omeprazole) .Marland Kitchen.. 1 Daily By Mouth 7)  Nitrostat 0.3 Mg  Subl (Nitroglycerin) .... As Needed 8)  Ferro-Bob 325 (65 Fe) Mg  Tabs (Ferrous Sulfate) .Marland Kitchen.. 1 By Mouths A Day 9)  Bayer Low Strength 81 Mg  Tbec (Aspirin) .Marland Kitchen.. 1 Daily By Mouth On Hold 10)  Humulin 70/30 70-30 %  Susp (Insulin Isophane & Regular) .... Take 35 Units Am and  Pm 11)  Accusure Insulin Syringe 31g X 5/16" 0.5 Ml  Misc (Insulin Syringe-Needle U-100) .... As Directed Two Times A Day 12)  Tylenol Extra Strength 500 Mg  Tabs (Acetaminophen)  .... As Needed Pain Usually Helps Except At Night 13)  Carvedilol 25 Mg Tabs (Carvedilol) .Marland Kitchen.. 1 Tablet Twice A Day By Mouth 14)  Tramadol Hcl 50 Mg Tabs (Tramadol Hcl) .Marland Kitchen.. 1 Every 4 To 6 Hours As Needed Pain 15)  Pletal 100 Mg Tabs (Cilostazol) .... One Tab By Mouth Twice A Day 16)  Coumadin 5 Mg Tabs (Warfarin Sodium) .... Take As Directed By Coumadin Clinic. On Hold 17)  Cyanocobalamin 1000 Mcg/ml Soln (Cyanocobalamin) .... Take Injection Every Other Week 18)  Nasonex 50 Mcg/act Susp (Mometasone Furoate) .... Once Daily 19)  Mucus Relief 400 Mg Tabs (Guaifenesin) .... As Needed 20)  Amlodipine Besylate 5 Mg Tabs (Amlodipine Besylate) .... Take One Tablet By Mouth Daily  Allergies: 1)  ! Codeine Sulfate (Codeine Sulfate)  Past History:  Past medical history reviewed for relevance to current acute and chronic problems.  Past Medical History: Reviewed history from 08/15/2008 and no changes required. Myocardial infarction, hx of (06/06/2003) Myocardial infarction, hx of (07/05/1996)   Current Problems:  HEMORRHOIDS (ICD-455.6) PRERENAL AZOTEMIA, MILD (ICD-790.6) GASTRITIS (ICD-535.50) HIATAL HERNIA (ICD-553.3) GERD (ICD-530.81) RENAL INSUFFICIENCY (ICD-588.9) ATRIAL ARRHYTHMIAS (ICD-427.89) MI (ICD-410.90) RENAL INSUFFICIENCY, CHRONIC (ICD-585.9) HYPOPOTASSEMIA (ICD-276.8) MYOCARDIAL INFARCTION, HX OF (ICD-412) HYPOTHYROIDISM (ICD-244.9) ATRIAL FIBRILLATION ON COUMADIN (ICD-427.31) GOUT (ICD-274.9) B12 DEFICIENCY (ICD-266.2) ANEMIA, MILD (ICD-285.9) BENIGN PROSTATIC HYPERTROPHY, HX OF (ICD-V13.8) RENAL CALCULUS, RECURRENT LITHOTRIPSY (ICD-592.0) HYPERTENSION (ICD-401.9) DIVERTICULAR DISEASE VIA ACBE (ICD-562.10) HYPERLIPIDEMIA (ICD-272.4) PVD LE'S (ICD-443.9) DYSPNEA, CHRONIC (ICD-786.05) DM (ICD-250.00) CAD (  MI 10/97, 9/04) (ICD-414.00)  1. Coronary artery disease status post remote diaphragmatic wall     infarction and status post drug-eluting stent to the right  coronary     artery in 2004 for in-stent restenosis, now stable. 2. Good left ventricular function. 3. Paroxysmal atrial fibrillation treated with rate control     medications and Coumadin. 4. Hypertension. 5. Diabetes. 6. Hyperlipidemia. 7. Peripheral vascular disease status post bilateral renal stenting. 8. Renal insufficiency with a creatinine in the range of 2.   Review of Systems       Positive for sinus congestion, dizziness, low back pain. Otherwise negative except as per HPI.  Vital Signs:  Patient profile:   77 year old male Height:      69 inches Weight:      205 pounds BMI:     30.38 Pulse rate:   85 / minute Pulse rhythm:   regular Resp:     18 per minute BP sitting:   124 / 58  (left arm) Cuff size:   regular  Vitals Entered By: Vikki Ports (October 21, 2009 9:03 AM)  Physical Exam  General:  Pt is alert and oriented, elderly male, in no acute distress. HEENT: normal Neck: normal carotid upstrokes without bruits, JVP normal Lungs: CTA CV: RRR without murmur or gallop Abd: soft, NT, positive BS, no bruit, no organomegaly Ext: no clubbing, cyanosis, or edema. peripheral pulses 2+ and equal Skin: warm and dry without rash    EKG  Procedure date:  10/21/2009  Findings:      NSR, cannot rule out age-indeterminate anterior MI, nonspecific ST-T changes.  Impression & Recommendations:  Problem # 1:  CAD, NATIVE VESSEL (ICD-414.01) The pt is stable from a cardiovascular perspective. He has no angina and has a reasonable functional capacity of greater than 4 METS. His anticoagulants are appropriately on hold for upcoming surgery. He tolerated back surgery last year without events. Recommend proceeding with surgery without further preoperative testing. He should continue his carvedilol without change throughout the perioperative period to cover him with a beta blocker. Resume coumadin after surgery when ok from a surgical perspective.  His updated medication  list for this problem includes:    Nitrostat 0.3 Mg Subl (Nitroglycerin) .Marland Kitchen... As needed    Bayer Low Strength 81 Mg Tbec (Aspirin) .Marland Kitchen... 1 daily by mouth on hold    Carvedilol 25 Mg Tabs (Carvedilol) .Marland Kitchen... 1 tablet twice a day by mouth    Pletal 100 Mg Tabs (Cilostazol) ..... One tab by mouth twice a day    Coumadin 5 Mg Tabs (Warfarin sodium) .Marland Kitchen... Take as directed by coumadin clinic. on hold    Amlodipine Besylate 5 Mg Tabs (Amlodipine besylate) .Marland Kitchen... Take one tablet by mouth daily  Problem # 2:  ATRIAL FIBRILLATION ON COUMADIN (ICD-427.31) In NSR today. Off coumadin for upcoming surgery. Resume postoperatively.  His updated medication list for this problem includes:    Digoxin 0.125 Mg Tabs (Digoxin) .Marland Kitchen... 1 tab once daily    Bayer Low Strength 81 Mg Tbec (Aspirin) .Marland Kitchen... 1 daily by mouth on hold    Carvedilol 25 Mg Tabs (Carvedilol) .Marland Kitchen... 1 tablet twice a day by mouth    Pletal 100 Mg Tabs (Cilostazol) ..... One tab by mouth twice a day    Coumadin 5 Mg Tabs (Warfarin sodium) .Marland Kitchen... Take as directed by coumadin clinic. on hold  Patient Instructions: 1)  Your physician recommends that you schedule a follow-up appointment in: please follow up  with dr Juanda Chance as scheduled

## 2010-11-04 NOTE — Progress Notes (Signed)
Summary: Surgical Clearance Pending 10/21/09  Phone Note From Other Clinic   Caller: Leota Sauers Pharm.D. - Coumadin Clinic Call For: Dr. Excell Seltzer Summary of Call: This is a pt of Dr. Regino Schultze coming on Mon 1/17 for cardiac clearence ro gall bladder sugrery.  He has HX CED with stents and is on Coumadin for A-Fib - no Hx of stroke.  I don't think he needs Lovenox bridge, but I want to verify with you during his office visit Monday.  Please f/u with Coumadin  Clinic.  Thanks Leota Sauers Initial call taken by: Leota Sauers Pharm.D.  Follow-up for Phone Call        will review when I see him. agree that he doesn't need lovenox bridge Follow-up by: Norva Karvonen, MD,  October 17, 2009 3:55 PM

## 2010-11-04 NOTE — Medication Information (Signed)
Summary: [facility] Management-Novolin 70/30  [employer] [facility]-Novolin 70/30   Imported By: Beau Fanny 10/24/2009 16:27:14  _____________________________________________________________________  External Attachment:    Type:   Image     Comment:   External Document

## 2010-11-04 NOTE — Medication Information (Signed)
Summary: ccr/jss  Anticoagulant Therapy  Managed by: Weston Brass, PharmD Referring MD: Charlies Constable MD PCP: [name], MD Supervising MD: Gala Romney MD, Reuel Boom Indication 1: Atrial Fibrillation (ICD-427.31) Lab Used: [facility] [place] Site: [address] INR POC 1.7 INR RANGE 2 - 3  Dietary changes: no    Health status changes: no    Bleeding/hemorrhagic complications: no    Recent/future hospitalizations: no    Any changes in medication regimen? no    Recent/future dental: no  Any missed doses?: no       Is patient compliant with meds? yes       Allergies: 1)  ! Codeine Sulfate (Codeine Sulfate)  Anticoagulation Management History:      The patient is taking warfarin and comes in today for a routine follow up visit.  Positive risk factors for bleeding include an age of 28 years or older and presence of serious comorbidities.  The bleeding index is 'intermediate risk'.  Positive CHADS2 values include History of HTN, Age > 21 years old, and History of Diabetes.  The start date was 08/19/2006.  His last INR was 5.3 ratio.  Anticoagulation responsible provider: Azlin Zilberman MD, Reuel Boom.  INR POC: 1.7.  Cuvette Lot#: [id number].  Exp: 05/2011.    Anticoagulation Management Assessment/Plan:      The target INR is 2.0-3.0.  The next INR is due 03/18/2010.  Anticoagulation instructions were given to patient.  Results were reviewed/authorized by Weston Brass, PharmD.  He was notified by Weston Brass PharmD.         Prior Anticoagulation Instructions: INR 2.0  Continue on same dosage 5mg  daily except 2.5mg  on Mondays, Wednesdays, and Fridays.  Recheck in 4 weeks.    Current Anticoagulation Instructions: INR 1.7  Take 1 1/2 tablets tonight then resume same schedule of 1 tablet every day except 1/2 tablet on Monday, Wednesday and Friday

## 2010-11-04 NOTE — Assessment & Plan Note (Signed)
Summary: FU per TMA Salley Scarlet   Vital Signs:  Patient profile:   77 year old male Weight:      188 pounds Pulse rate:   80 / minute Pulse rhythm:   regular BP sitting:   132 / 54  (left arm) Cuff size:   regular  Vitals Entered By: Lowella Petties CMA (November 07, 2009 2:12 PM) CC: follow-up visit   History of Present Illness: 10 here for follow up gout and acute renal insufficiency.    Saw him on 1/31, had been in hospital recenlty to have his gall bladder removed, toe has been hurting, getting more red and swollen since he was discharged from the hospital. No fevers or chills. No known trauma to his toe. Always has some nausea, no vomiting, no diarrhea.  Uric acid elevated to 8.4. Started Colchicine at lower dose given acute renal insufficiency. Today, toe feels much better already.  Has a loose stools, but no diarrhea or abdominal cramping from the Colchicine.  ARI- Cr was 1.9 on 1/31 (1.29 on 1/25).  Likely prerenal due to dehydration s/p acute illness and surgery.  Has been drinking more water.     Current Medications (verified): 1)  Avodart 0.5 Mg Caps (Dutasteride) .... Take 1 Capsule Once A Day 2)  Glyburide Micronized 6 Mg Tabs (Glyburide Micronized) .Marland Kitchen.. 1 By Mouth Two Times A Day 3)  Levothyroxine Sodium 25 Mcg Tabs (Levothyroxine Sodium) .Marland Kitchen.. 1 By Mouth Daily 4)  Lipitor 80 Mg  Tabs (Atorvastatin Calcium) .... One Tab By Mouth At Night. 5)  Digoxin 0.125 Mg Tabs (Digoxin) .Marland Kitchen.. 1 Tab Once Daily 6)  Prilosec 20 Mg Cpdr (Omeprazole) .Marland Kitchen.. 1 Daily By Mouth 7)  Nitrostat 0.3 Mg  Subl (Nitroglycerin) .... As Needed 8)  Ferro-Bob 325 (65 Fe) Mg  Tabs (Ferrous Sulfate) .Marland Kitchen.. 1 By Mouths A Day 9)  Bayer Low Strength 81 Mg  Tbec (Aspirin) .Marland Kitchen.. 1 Daily By Mouth On Hold 10)  Accusure Insulin Syringe 31g X 5/16" 0.5 Ml  Misc (Insulin Syringe-Needle U-100) .... As Directed Two Times A Day 11)  Tylenol Extra Strength 500 Mg  Tabs (Acetaminophen) .... As Needed Pain Usually Helps Except  At Night 12)  Carvedilol 25 Mg Tabs (Carvedilol) .Marland Kitchen.. 1 Tablet Twice A Day By Mouth 13)  Tramadol Hcl 50 Mg Tabs (Tramadol Hcl) .Marland Kitchen.. 1 Every 4 To 6 Hours As Needed Pain 14)  Pletal 100 Mg Tabs (Cilostazol) .... One Tab By Mouth Twice A Day 15)  Coumadin 5 Mg Tabs (Warfarin Sodium) .... Take As Directed By Coumadin Clinic. On Hold 16)  Cyanocobalamin 1000 Mcg/ml Soln (Cyanocobalamin) .... Take Injection Every Other Week 17)  Nasonex 50 Mcg/act Susp (Mometasone Furoate) .... Once Daily 18)  Mucus Relief 400 Mg Tabs (Guaifenesin) .... As Needed 19)  Amlodipine Besylate 5 Mg Tabs (Amlodipine Besylate) .... Take One Tablet By Mouth Daily 20)  Novolog 100 Unit/ml Soln (Insulin Aspart) .... 35 Units Every Am, 35 Units Every Pm 21)  Oxycodone-Acetaminophen 5-500 Mg Caps (Oxycodone-Acetaminophen) .... As Needed 22)  Colcrys 0.6 Mg Tabs (Colchicine) .... 2 Tabs By Mouth X 1 Now, Then 1 Tab By Mouth An Hour Later.  Then Take One Tab By Mouth Two Times A Day. 23)  Tramadol Hcl 50 Mg  Tabs (Tramadol Hcl) .Marland Kitchen.. 1 Tab By Mouth Two Times A Day As Needed Pain.  Allergies (verified): 1)  ! Codeine Sulfate (Codeine Sulfate)  Review of Systems      See HPI General:  Denies chills, fever, malaise, and weakness. CV:  Denies chest pain or discomfort. GI:  Denies abdominal pain, bloody stools, diarrhea, nausea, and vomiting.  Physical Exam  General:  Well-developed,well-nourished; alert,appropriate and cooperative throughout examination, obese., color better today. Mouth:  MMM Msk:  Left great toe still red and warm, much less tender and less swollen. Extremities:  Minimal edema of toes.    Impression & Recommendations:  Problem # 1:  ACUTE GOUTY ARTHROPATHY (ICD-274.01) Assessment Improved Recheck uric acid today.  Continue colchicine at current dose, tolerating well. His updated medication list for this problem includes:    Colcrys 0.6 Mg Tabs (Colchicine) .Marland Kitchen... 2 tabs by mouth x 1 now, then 1 tab by  mouth an hour later.  then take one tab by mouth two times a day.  Orders: Venipuncture (30160) TLB-Uric Acid, Blood ([id number]-[id number])  Problem # 2:  RENAL FAILURE, ACUTE (ICD-584.9) Assessment: New Likely prerenal.  Recheck renal function panel today.  Complete Medication List: 1)  Avodart 0.5 Mg Caps (Dutasteride) .... Take 1 capsule once a day 2)  Glyburide Micronized 6 Mg Tabs (Glyburide micronized) .Marland Kitchen.. 1 by mouth two times a day 3)  Levothyroxine Sodium 25 Mcg Tabs (Levothyroxine sodium) .Marland Kitchen.. 1 by mouth daily 4)  Lipitor 80 Mg Tabs (Atorvastatin calcium) .... One tab by mouth at night. 5)  Digoxin 0.125 Mg Tabs (Digoxin) .Marland Kitchen.. 1 tab once daily 6)  Prilosec 20 Mg Cpdr (Omeprazole) .Marland Kitchen.. 1 daily by mouth 7)  Nitrostat 0.3 Mg Subl (Nitroglycerin) .... As needed 8)  Ferro-bob 325 (65 Fe) Mg Tabs (Ferrous sulfate) .Marland Kitchen.. 1 by mouths a day 9)  Bayer Low Strength 81 Mg Tbec (Aspirin) .Marland Kitchen.. 1 daily by mouth on hold 10)  Accusure Insulin Syringe 31g X 5/16" 0.5 Ml Misc (Insulin syringe-needle u-100) .... As directed two times a day 11)  Tylenol Extra Strength 500 Mg Tabs (Acetaminophen) .... As needed pain usually helps except at night 12)  Carvedilol 25 Mg Tabs (Carvedilol) .Marland Kitchen.. 1 tablet twice a day by mouth 13)  Tramadol Hcl 50 Mg Tabs (Tramadol hcl) .Marland Kitchen.. 1 every 4 to 6 hours as needed pain 14)  Pletal 100 Mg Tabs (Cilostazol) .... One tab by mouth twice a day 15)  Coumadin 5 Mg Tabs (Warfarin sodium) .... Take as directed by coumadin clinic. on hold 16)  Cyanocobalamin 1000 Mcg/ml Soln (Cyanocobalamin) .... Take injection every other week 17)  Nasonex 50 Mcg/act Susp (Mometasone furoate) .... Once daily 18)  Mucus Relief 400 Mg Tabs (Guaifenesin) .... As needed 19)  Amlodipine Besylate 5 Mg Tabs (Amlodipine besylate) .... Take one tablet by mouth daily 20)  Novolog 100 Unit/ml Soln (Insulin aspart) .... 35 units every am, 35 units every pm 21)  Oxycodone-acetaminophen 5-500 Mg Caps  (Oxycodone-acetaminophen) .... As needed 22)  Colcrys 0.6 Mg Tabs (Colchicine) .... 2 tabs by mouth x 1 now, then 1 tab by mouth an hour later.  then take one tab by mouth two times a day. 23)  Tramadol Hcl 50 Mg Tabs (Tramadol hcl) .Marland Kitchen.. 1 tab by mouth two times a day as needed pain.  Other Orders: TLB-Renal Function Panel ([id number])

## 2010-11-04 NOTE — Letter (Signed)
Summary: Consult Scheduled [facility]  [address] [place], [place] [address]   Phone: [phone]  Fax: [phone]    05/21/2010 MRN: [id number]    Dear Justin Lane,      We have scheduled an appointment for you.  At the recommendation of Dr.Duncan, we have scheduled you a consult with Dr Terrace Arabia of [facility] on Monday, October 3rd, 2011 at 10:00am arrival time.  Their phone number is [phone].  If this appointment day and time is not convenient for you, please feel free to call the office of the doctor you are being referred to at the number listed above and reschedule the appointment.  If you have any questions, please call and speak with Carlton Adam at [phone].   Thank you,  Cyndi Bender at [facility]

## 2010-11-04 NOTE — Medication Information (Signed)
Summary: rov/tm  Anticoagulant Therapy  Managed by: Shelby Dubin, PharmD, BCPS, CPP Referring MD: Charlies Constable MD PCP: [name], MD Supervising MD: Juanda Chance MD, Treyvone Chelf Indication 1: Atrial Fibrillation (ICD-427.31) Lab Used: [facility] [place] Site: [address] INR POC 2.4 INR RANGE 2 - 3  Dietary changes: no    Health status changes: no    Bleeding/hemorrhagic complications: no    Recent/future hospitalizations: no    Any changes in medication regimen? no    Recent/future dental: no  Any missed doses?: no       Is patient compliant with meds? yes       Allergies (verified): 1)  ! Codeine Sulfate (Codeine Sulfate)  Anticoagulation Management History:      The patient is taking warfarin and comes in today for a routine follow up visit.  Positive risk factors for bleeding include an age of 75 years or older and presence of serious comorbidities.  The bleeding index is 'intermediate risk'.  Positive CHADS2 values include History of HTN, Age > 73 years old, and History of Diabetes.  The start date was 08/19/2006.  His last INR was 5.3 ratio.  Anticoagulation responsible provider: Juanda Chance MD, Smitty Cords.  INR POC: 2.4.  Cuvette Lot#: [id number].  Exp: 02/2011.    Anticoagulation Management Assessment/Plan:      The target INR is 2.0-3.0.  The next INR is due 12/27/2009.  Anticoagulation instructions were given to patient.  Results were reviewed/authorized by Shelby Dubin, PharmD, BCPS, CPP.  He was notified by Shelby Dubin PharmD, BCPS, CPP.         Prior Anticoagulation Instructions: INR 2.2 1 pill everyday except 1/2 pill on Mondays, Wednesday, and Fridays. Recheck in one week.   Current Anticoagulation Instructions: INR 2.4  Continue 0.5 tab each Monday, Wednesday, Friday and 1 tab on all other days.  Recheck in 3 weeks.

## 2010-11-04 NOTE — Medication Information (Signed)
Summary: rov/tm  Anticoagulant Therapy  Managed by: Cloyde Reams, RN, BSN Referring MD: Charlies Constable MD PCP: [name], MD Supervising MD: Myrtis Ser MD, Tinnie Gens Indication 1: Atrial Fibrillation (ICD-427.31) Lab Used: [facility] [place] Site: [address] INR POC 2.0 INR RANGE 2 - 3  Dietary changes: no    Health status changes: no    Bleeding/hemorrhagic complications: no    Recent/future hospitalizations: no    Any changes in medication regimen? no    Recent/future dental: no  Any missed doses?: no       Is patient compliant with meds? yes       Allergies (verified): 1)  ! Codeine Sulfate (Codeine Sulfate)  Anticoagulation Management History:      The patient is taking warfarin and comes in today for a routine follow up visit.  Positive risk factors for bleeding include an age of 20 years or older and presence of serious comorbidities.  The bleeding index is 'intermediate risk'.  Positive CHADS2 values include History of HTN, Age > 18 years old, and History of Diabetes.  The start date was 08/19/2006.  His last INR was 5.3 ratio.  Anticoagulation responsible provider: Myrtis Ser MD, Tinnie Gens.  INR POC: 2.0.  Cuvette Lot#: [id number].  Exp: 03/2011.    Anticoagulation Management Assessment/Plan:      The target INR is 2.0-3.0.  The next INR is due 02/20/2010.  Anticoagulation instructions were given to patient.  Results were reviewed/authorized by Cloyde Reams, RN, BSN.  He was notified by Cloyde Reams RN.         Prior Anticoagulation Instructions: INR 2.1 Continue 5mg s daily except 2.5mg s on Mondays, Wednesdays and Fridays. Recheck in 4 weeks.   Current Anticoagulation Instructions: INR 2.0  Continue on same dosage 5mg  daily except 2.5mg  on Mondays, Wednesdays, and Fridays.  Recheck in 4 weeks.

## 2010-11-04 NOTE — Letter (Signed)
Summary: Dr.Matthew Juliane Lack  Dr.Matthew Juliane Lack   Imported By: Beau Fanny 12/25/2009 10:14:49  _____________________________________________________________________  External Attachment:    Type:   Image     Comment:   External Document

## 2010-11-04 NOTE — Assessment & Plan Note (Signed)
Summary: 3 MONTH FOLLOW UP/[facility]   Vital Signs:  Patient profile:   77 year old male Height:      69 inches Weight:      206.25 pounds BMI:     30.57 Temp:     97.4 degrees F oral Pulse rate:   88 / minute Pulse rhythm:   regular BP sitting:   130 / 66  (left arm) Cuff size:   large  Vitals Entered By: Delilah Shan CMA Duncan Dull) (July 03, 2010 3:07 PM) CC: 3 month follow up   History of Present Illness: Diabetes:  Using medications without difficulties:yes Hypoglycemic episodes:no Hyperglycemic episodes:yes Feet problems: see below Blood Sugars averaging:  ~140-200 in AM  taking 15 of levemir.  He is okay with titration of dose with proper instructions.   He had a hangnail on R first toe, lateral side, now with minimal erythema.    Has follow up with neuro on monday for tremor.   H/o rhinitis, responed to nasonex, needs refill.   Current Medications (verified): 1)  Avodart 0.5 Mg Caps (Dutasteride) .... Take 1 Capsule Once A Day 2)  Glyburide Micronized 6 Mg Tabs (Glyburide Micronized) .Marland Kitchen.. 1 By Mouth Two Times A Day 3)  Levothyroxine Sodium 25 Mcg Tabs (Levothyroxine Sodium) .Marland Kitchen.. 1 By Mouth Daily 4)  Lipitor 80 Mg  Tabs (Atorvastatin Calcium) .... One Tab By Mouth At Night. 5)  Digoxin 0.125 Mg Tabs (Digoxin) .Marland Kitchen.. 1 Tab Once Daily 6)  Nitrostat 0.3 Mg  Subl (Nitroglycerin) .... As Needed 7)  Ferro-Bob 325 (65 Fe) Mg  Tabs (Ferrous Sulfate) .Marland Kitchen.. 1 By Mouths A Day 8)  Bd Insulin Syringe Ultrafine 31g X 5/16" 0.5 Ml Misc (Insulin Syringe-Needle U-100) .... Use Three Times A Day 9)  Tylenol Extra Strength 500 Mg  Tabs (Acetaminophen) .... As Needed Pain Usually Helps Except At Night 10)  Carvedilol 25 Mg Tabs (Carvedilol) .Marland Kitchen.. 1 Tablet Twice A Day By Mouth 11)  Tramadol Hcl 50 Mg Tabs (Tramadol Hcl) .Marland Kitchen.. 1 Every 4 To 6 Hours As Needed Pain 12)  Pletal 100 Mg Tabs (Cilostazol) .... One Tab By Mouth Twice A Day 13)  Vitamin B-12 1000 Mcg Tabs (Cyanocobalamin) .... 2,000 Mg  Once Daily 14)  Mucus Relief 400 Mg Tabs (Guaifenesin) .... As Needed 15)  Amlodipine Besylate 5 Mg Tabs (Amlodipine Besylate) .... Take One Tablet By Mouth Daily 16)  Novolog 100 Unit/ml Soln (Insulin Aspart) .... 35 Units Every Am, 35 Units Every Pm 17)  Oxycodone-Acetaminophen 5-500 Mg Caps (Oxycodone-Acetaminophen) .... As Needed 18)  Levemir 100 Unit/ml Soln (Insulin Detemir) .Marland Kitchen.. 15 Units Subcut Daily 19)  Warfarin Sodium 5 Mg Tabs (Warfarin Sodium) .... Use As Directed By Anticoagulation Clinic 20)  Singulair 10 Mg Tabs (Montelukast Sodium) .... Take 1 Tablet Everyday  Allergies: 1)  ! Codeine Sulfate (Codeine Sulfate)  Review of Systems       See HPI.  Otherwise negative.    Physical Exam  General:  GEN: nad, alert and oriented HEENT: mucous membranes moist NECK: supple w/o LA CV: rrr.  PULM: ctab, no inc wob ABD: soft, +bs EXT: no edema SKIN: no acute rash but nail on R 1st toe with potential to be ingrown; already thickened.  there is minimal erythema but no fluctuant mass on proximal nail bed.  this hasn't increased per patient.  bilateral  hand tremor noted during exam, this is episodic.    Impression & Recommendations:  Problem # 1:  TREMOR (ICD-781.0) follow up  with neuro.   Problem # 2:  ALLERGIC RHINITIS (ICD-477.9) continue nasonex.  The following medications were removed from the medication list:    Nasonex 50 Mcg/act Susp (Mometasone furoate) ..... Once daily His updated medication list for this problem includes:    Nasonex 50 Mcg/act Susp (Mometasone furoate) .Marland Kitchen... 1-2 sprays per nostril per day  Problem # 3:  DM (ICD-250.00) I d/w patient [id number].   >25 min spent with patient, at least half of which was spent on counseling re:DM2.  See plan below.  no change in novolog  but will titrate levemir.  He understands and agrees, will call back with any questions. Refer to podiatry for eval.  I don't see need for I&D now and I would observe the erythema and  continue local treatment as this has improved some per report.  Advised patient not to pick at nails.  He understood.  If increase in erythema, patient to notify MD.   His updated medication list for this problem includes:    Glyburide Micronized 6 Mg Tabs (Glyburide micronized) .Marland Kitchen... 1 by mouth two times a day    Novolog 100 Unit/ml Soln (Insulin aspart) .Marland KitchenMarland KitchenMarland KitchenMarland Kitchen 35 units every am, 35 units every pm    Levemir 100 Unit/ml Soln (Insulin detemir) .Marland KitchenMarland KitchenMarland KitchenMarland Kitchen 15 units subcut daily  Orders: Podiatry Referral (Podiatry)  Complete Medication List: 1)  Avodart 0.5 Mg Caps (Dutasteride) .... Take 1 capsule once a day 2)  Glyburide Micronized 6 Mg Tabs (Glyburide micronized) .Marland Kitchen.. 1 by mouth two times a day 3)  Levothyroxine Sodium 25 Mcg Tabs (Levothyroxine sodium) .Marland Kitchen.. 1 by mouth daily 4)  Lipitor 80 Mg Tabs (Atorvastatin calcium) .... One tab by mouth at night. 5)  Digoxin 0.125 Mg Tabs (Digoxin) .Marland Kitchen.. 1 tab once daily 6)  Nitrostat 0.3 Mg Subl (Nitroglycerin) .... As needed 7)  Ferro-bob 325 (65 Fe) Mg Tabs (Ferrous sulfate) .Marland Kitchen.. 1 by mouths a day 8)  Bd Insulin Syringe Ultrafine 31g X 5/16" 0.5 Ml Misc (Insulin syringe-needle u-100) .... Use three times a day 9)  Tylenol Extra Strength 500 Mg Tabs (Acetaminophen) .... As needed pain usually helps except at night 10)  Carvedilol 25 Mg Tabs (Carvedilol) .Marland Kitchen.. 1 tablet twice a day by mouth 11)  Tramadol Hcl 50 Mg Tabs (Tramadol hcl) .Marland Kitchen.. 1 every 4 to 6 hours as needed pain 12)  Pletal 100 Mg Tabs (Cilostazol) .... One tab by mouth twice a day 13)  Vitamin B-12 1000 Mcg Tabs (Cyanocobalamin) .... 2,000 mg once daily 14)  Mucus Relief 400 Mg Tabs (Guaifenesin) .... As needed 15)  Amlodipine Besylate 5 Mg Tabs (Amlodipine besylate) .... Take one tablet by mouth daily 16)  Novolog 100 Unit/ml Soln (Insulin aspart) .... 35 units every am, 35 units every pm 17)  Oxycodone-acetaminophen 5-500 Mg Caps (Oxycodone-acetaminophen) .... As needed 18)  Levemir 100  Unit/ml Soln (Insulin detemir) .Marland Kitchen.. 15 units subcut daily 19)  Warfarin Sodium 5 Mg Tabs (Warfarin sodium) .... Use as directed by anticoagulation clinic 20)  Singulair 10 Mg Tabs (Montelukast sodium) .... Take 1 tablet everyday 21)  Nasonex 50 Mcg/act Susp (Mometasone furoate) .Marland Kitchen.. 1-2 sprays per nostril per day  Patient Instructions: 1)  Please schedule a follow-up appointment in 3 months with a lab visit a few days before (A1c, 250.00). 2)  If your AM sugar is 99 or lower, then decrease your levemir by 1 unit. 3)  If your AM sugar is 100 to 120, don't change your levemir dose. 4)  If your AM sugar is 121 or higher, then increase your levemir by 1 unit. 5)  Let me know if you have questions about your sugar in the meantime.  6)  See Shirlee Limerick about your referral before your leave today.    7)  Let me know if the redness on your toe gets bigger.  Take care.  Prescriptions: NASONEX 50 MCG/ACT SUSP (MOMETASONE FUROATE) 1-2 sprays per nostril per day  #1 x 12   Entered and Authorized by:   Crawford Givens MD   Signed by:   Crawford Givens MD on 07/03/2010   Method used:   Electronically to        [facility] (retail)       [address]       [place], [place]  [id number]       Ph: [phone]       Fax: [phone]   RxID:   [id number]   Current Allergies (reviewed today): ! CODEINE SULFATE (CODEINE SULFATE)  Appended Document: 3 MONTH FOLLOW UP/[facility] Flu Vaccine Consent Questions     Do you have a history of severe allergic reactions to this vaccine? no    Any prior history of allergic reactions to egg and/or gelatin? no    Do you have a sensitivity to the preservative Thimersol? no    Do you have a past history of Guillan-Barre Syndrome? no    Do you currently have an acute febrile illness? no    Have you ever had a severe reaction to latex? no    Vaccine information given and explained to patient? yes    Are you currently pregnant? no    Lot  Number:[id number]   Exp Date:04/04/2011   Site Given  Left Deltoid IM Lugene Fuquay CMA (AAMA)  July 07, 2010 8:33 AM

## 2010-11-04 NOTE — Letter (Signed)
Summary: Handout Printed  Printed Handout:  - Coumadin Instructions-w/out Meds 

## 2010-11-04 NOTE — Progress Notes (Signed)
Summary: Rx Cilostazol  Phone Note Refill Request Call back at [phone] Message from:  [facility] on June 04, 2010 11:47 AM  Refills Requested: Medication #1:  PLETAL 100 MG TABS one tab by mouth twice a day   Last Refilled: 04/28/2010  Method Requested: Electronic Initial call taken by: Sydell Axon LPN,  June 04, 2010 11:48 AM  Follow-up for Phone Call       Follow-up by: Crawford Givens MD,  June 04, 2010 12:30 PM    Prescriptions: PLETAL 100 MG TABS (CILOSTAZOL) one tab by mouth twice a day  #60 x 6   Entered and Authorized by:   Crawford Givens MD   Signed by:   Crawford Givens MD on 06/04/2010   Method used:   Electronically to        [facility] (retail)       [address]       [place], [place]  [id number]       Ph: [phone]       Fax: [phone]   RxID:   [id number]

## 2010-11-04 NOTE — Medication Information (Signed)
Summary: rov/sp  Anticoagulant Therapy  Managed by: [name], RN, BSN Referring MD: Charlies Constable MD PCP: [name], MD Supervising MD: Graciela Husbands MD, Viviann Spare Indication 1: Atrial Fibrillation (ICD-427.31) Lab Used: [facility] [place] Site: [address] INR POC 1.3 INR RANGE 2 - 3  Dietary changes: no    Health status changes: no    Bleeding/hemorrhagic complications: no    Recent/future hospitalizations: no    Any changes in medication regimen? yes       Details: Insulin dose increased  Recent/future dental: no  Any missed doses?: no       Is patient compliant with meds? yes       Allergies: 1)  ! Codeine Sulfate (Codeine Sulfate)  Anticoagulation Management History:      The patient is taking warfarin and comes in today for a routine follow up visit.  Positive risk factors for bleeding include an age of 37 years or older and presence of serious comorbidities.  The bleeding index is 'intermediate risk'.  Positive CHADS2 values include History of HTN, Age > 23 years old, and History of Diabetes.  The start date was 08/19/2006.  His last INR was 5.3 ratio.  Anticoagulation responsible provider: Graciela Husbands MD, Viviann Spare.  INR POC: 1.3.  Cuvette Lot#: [id number].  Exp: 05/2011.    Anticoagulation Management Assessment/Plan:      The patient's current anticoagulation dose is Warfarin sodium 5 mg tabs: Use as directed by Anticoagulation Clinic.  The target INR is 2.0-3.0.  The next INR is due 04/28/2010.  Anticoagulation instructions were given to patient.  Results were reviewed/authorized by [name], RN, BSN.  He was notified by [name], RN, BSN.         Prior Anticoagulation Instructions: INR 1.3  Take 1 tablet today then increase dose to 1 tablet every day except 1/2 tablet on Monday and Thursday.    Current Anticoagulation Instructions: INR 1.3 Today take 1.5 tablets then change dose to 1 tablet everyday. Recheck in 10 days.

## 2010-11-04 NOTE — Letter (Signed)
Summary: Pt's Blood Sugar Readings-05/15/10-05/24/10  Pt's Blood Sugar Readings-05/15/10-05/24/10   Imported By: Beau Fanny 05/28/2010 15:58:21  _____________________________________________________________________  External Attachment:    Type:   Image     Comment:   External Document  Appended Document: Pt's Blood Sugar Readings-05/15/10-05/24/10 I want patient to increase his levemir to 10 units daily.  Don't change his other insulin dose yet.  Keep checking sugar and let me know about his readings in 10-14 days.  Thanks.   Appended Document: Pt's Blood Sugar Readings-05/15/10-05/24/10 Patient Advised.

## 2010-11-04 NOTE — Assessment & Plan Note (Signed)
Summary: hands and arms are shakey, feels weak/alc   Vital Signs:  Patient profile:   77 year old male Height:      69 inches Weight:      207.75 pounds BMI:     30.79 Temp:     97.6 degrees F oral Pulse rate:   92 / minute Pulse rhythm:   regular BP sitting:   126 / 58  (left arm) Cuff size:   large  Vitals Entered By: Delilah Shan CMA Duncan Dull) (May 13, 2010 10:23 AM) CC: Hands and arms shakey, feels weak.   History of Present Illness: R handed.  Noticed over last year.  Tremor noted in arms more than legs.  L arm tremor is present most of the time.  L>R and arms>legs.  Pt can usually make tremor stop if he is holding something.  Speech changes noted by family- not looking for words per patient but some changes with comprehension during conversation.  Hard of hearing at baseline.  No trouble with swallowing.  Appetite is decreased.   Family noted change eating pattern- "he was just more messy at mealtime."  Family had noted short term memory changes.    Diabetes:  Using medications without difficulties: yes Hypoglycemic episodes:no Hyperglycemic episodes:yes, up to 200s Feet problems:no Blood Sugars averaging: usually in low 200s  Problems Prior to Update: 1)  Tremor  (ICD-781.0) 2)  Coumadin Therapy  (ICD-V58.61) 3)  Nausea and Vomiting  (ICD-787.01) 4)  Abnormal Exam-biliary Tract  (ICD-793.3) 5)  Renal Failure, Acute  (ICD-584.9) 6)  Acute Gouty Arthropathy  (ICD-274.01) 7)  Toe Pain  (ICD-729.5) 8)  Postural Lightheadedness  (ICD-780.4) 9)  Obesity  (ICD-278.00) 10)  Allergic Rhinitis  (ICD-477.9) 11)  Abdominal Pain-multiple Sites  (ICD-789.09) 12)  Abdominal Pain, Epigastric  (ICD-789.06) 13)  Fatty Liver Disease  (ICD-571.8) 14)  Renal Artery Stenosis  (ICD-440.1) 15)  Cad, Native Vessel  (ICD-414.01) 16)  Back Pain  (ICD-724.5) 17)  Hemorrhoids  (ICD-455.6) 18)  Prerenal Azotemia, Mild  (ICD-790.6) 19)  Gastritis  (ICD-535.50) 20)  Hiatal Hernia   (ICD-553.3) 21)  Gerd  (ICD-530.81) 22)  Renal Insufficiency Avoid Nsaids  (ICD-588.9) 23)  Atrial Arrhythmias  (ICD-427.89) 24)  Mi  (ICD-410.90) 25)  Myocardial Infarction, Hx of  (ICD-412) 26)  Hypothyroidism  (ICD-244.9) 27)  Atrial Fibrillation On Coumadin  (ICD-427.31) 28)  Gout  (ICD-274.9) 29)  B12 Deficiency  (ICD-266.2) 30)  Anemia, Mild  (ICD-285.9) 31)  Benign Prostatic Hypertrophy, Hx of  (ICD-V13.8) 32)  Renal Calculus, Recurrent Lithotripsy  (ICD-592.0) 33)  Hypertension  (ICD-401.9) 34)  Diverticular Disease Via Acbe  (ICD-562.10) 35)  Hyperlipidemia  (ICD-272.4) 36)  Pvd Le's  (ICD-443.9) 37)  Dyspnea, Chronic  (ICD-786.05) 38)  Dm  (ICD-250.00) 39)  Cad (MI 10/97, 9/04)  (ICD-414.00)  Allergies: 1)  ! Codeine Sulfate (Codeine Sulfate)  Past History:  Social History: Last updated: 05/13/2010 Occupation: [employer], [employer] Retired Married lives w/ wife    3 children quit smoking 1988, quit after MI no alcohol   Past Medical History: Myocardial infarction, hx of (06/06/2003) Myocardial infarction, hx of (07/05/1996) Current Problems:  HEMORRHOIDS (ICD-455.6) PRERENAL AZOTEMIA, MILD (ICD-790.6) GASTRITIS (ICD-535.50) HIATAL HERNIA (ICD-553.3) GERD (ICD-530.81) RENAL INSUFFICIENCY (ICD-588.9) ATRIAL ARRHYTHMIAS (ICD-427.89) MI (ICD-410.90) RENAL INSUFFICIENCY, CHRONIC (ICD-585.9) HYPOPOTASSEMIA (ICD-276.8) MYOCARDIAL INFARCTION, HX OF (ICD-412) HYPOTHYROIDISM (ICD-244.9) ATRIAL FIBRILLATION ON COUMADIN (ICD-427.31) GOUT (ICD-274.9) B12 DEFICIENCY (ICD-266.2) ANEMIA, MILD (ICD-285.9) BENIGN PROSTATIC HYPERTROPHY, HX OF (ICD-V13.8) RENAL CALCULUS, RECURRENT LITHOTRIPSY (ICD-592.0) HYPERTENSION (  ICD-401.9) DIVERTICULAR DISEASE VIA ACBE (ICD-562.10) HYPERLIPIDEMIA (ICD-272.4) PVD LE'S (ICD-443.9) DYSPNEA, CHRONIC (ICD-786.05) DM (ICD-250.00) CAD (MI 10/97, 9/04) (ICD-414.00)  1. Coronary artery disease status post remote diaphragmatic  wall     infarction and status post drug-eluting stent to the right coronary     artery in 2004 for in-stent restenosis, now stable. 2. Good left ventricular function. 3. Paroxysmal atrial fibrillation treated with rate control     medications and Coumadin. 4. Hypertension. 5. Diabetes. 6. Hyperlipidemia. 7. Peripheral vascular disease status post bilateral renal stenting. 8. Renal insufficiency with a creatinine in the range of 2.   MMSE 05/2010: 28/30 (1/3 on recall)  Family History: Reviewed history from 09/18/2008 and no changes required. Father dec 69 Ca ?skin Mother dec 64 MI Brother dec 64 MI  Brother A 79 CAD  Sister A 78 DM no h/o parkinson's or other tremor.  Social History: Reviewed history from 09/18/2008 and no changes required. Occupation: [employer], supply Retired Married lives w/ wife    3 children quit smoking 1988, quit after MI no alcohol   Review of Systems       See HPI.  Otherwise negative.    Physical Exam  General:  GEN: nad, alert and oriented MMSE 28/30, 1/3 on recall HEENT: mucous membranes moist, PERRL, EOMI NECK: supple w/o LA CV: rrr.  PULM: ctab, no inc wob ABD: soft, +bs EXT: no edema SKIN: no acute rash  CN 2-12 wnl B, S/S wnl x4, globally decreased DTRs- this is symmetric.  L hand tremor noted during exam, this is episodic.    Impression & Recommendations:  Problem # 1:  TREMOR (ICD-781.0) >25 min spent with patient, spent face to face for history and exam.  Family had requested neuro eval.  Will check basic labs and proceed with referral.  I don't think this is parkinsonian- he has no cogwheeling, but I would lik Neuro input on the source of the tremor.  Orders: TLB-BMP (Basic Metabolic Panel-BMET) (80048-METABOL) TLB-Hepatic/Liver Function Pnl (80076-HEPATIC) TLB-CBC Platelet - w/Differential (85025-CBCD) TLB-TSH (Thyroid Stimulating Hormone) (84443-TSH) TLB-B12 + Folate Pnl (04540_98119-J47/WGN)  Problem # 2:  DM  (ICD-250.00) Will contact with labs.  His updated medication list for this problem includes:    Glyburide Micronized 6 Mg Tabs (Glyburide micronized) .Marland Kitchen... 1 by mouth two times a day    Novolog 100 Unit/ml Soln (Insulin aspart) .Marland KitchenMarland KitchenMarland KitchenMarland Kitchen 35 units every am, 35 units every pm    Levemir 100 Unit/ml Soln (Insulin detemir) .Marland Kitchen... 8 units subcut daily  Orders: TLB-A1C / Hgb A1C (Glycohemoglobin) (83036-A1C)  Complete Medication List: 1)  Avodart 0.5 Mg Caps (Dutasteride) .... Take 1 capsule once a day 2)  Glyburide Micronized 6 Mg Tabs (Glyburide micronized) .Marland Kitchen.. 1 by mouth two times a day 3)  Levothyroxine Sodium 25 Mcg Tabs (Levothyroxine sodium) .Marland Kitchen.. 1 by mouth daily 4)  Lipitor 80 Mg Tabs (Atorvastatin calcium) .... One tab by mouth at night. 5)  Digoxin 0.125 Mg Tabs (Digoxin) .Marland Kitchen.. 1 tab once daily 6)  Nitrostat 0.3 Mg Subl (Nitroglycerin) .... As needed 7)  Ferro-bob 325 (65 Fe) Mg Tabs (Ferrous sulfate) .Marland Kitchen.. 1 by mouths a day 8)  Bd Insulin Syringe Ultrafine 31g X 5/16" 0.5 Ml Misc (Insulin syringe-needle u-100) .... Use twice a day 9)  Tylenol Extra Strength 500 Mg Tabs (Acetaminophen) .... As needed pain usually helps except at night 10)  Carvedilol 25 Mg Tabs (Carvedilol) .Marland Kitchen.. 1 tablet twice a day by mouth 11)  Tramadol Hcl 50  Mg Tabs (Tramadol hcl) .Marland Kitchen.. 1 every 4 to 6 hours as needed pain 12)  Pletal 100 Mg Tabs (Cilostazol) .... One tab by mouth twice a day 13)  Vitamin B-12 1000 Mcg Tabs (Cyanocobalamin) .... Take  1 tablet daily 14)  Nasonex 50 Mcg/act Susp (Mometasone furoate) .... Once daily 15)  Mucus Relief 400 Mg Tabs (Guaifenesin) .... As needed 16)  Amlodipine Besylate 5 Mg Tabs (Amlodipine besylate) .... Take one tablet by mouth daily 17)  Novolog 100 Unit/ml Soln (Insulin aspart) .... 35 units every am, 35 units every pm 18)  Oxycodone-acetaminophen 5-500 Mg Caps (Oxycodone-acetaminophen) .... As needed 19)  Levemir 100 Unit/ml Soln (Insulin detemir) .... 8 units subcut  daily 20)  Warfarin Sodium 5 Mg Tabs (Warfarin sodium) .... Use as directed by anticoagulation clinic 21)  Singulair 10 Mg Tabs (Montelukast sodium) .... Take 1 tablet everyday  Patient Instructions: 1)  See Shirlee Limerick about your referral before your leave today.   We'll contact you with your lab report.   Current Allergies (reviewed today): ! CODEINE SULFATE (CODEINE SULFATE)

## 2010-11-04 NOTE — Medication Information (Signed)
Summary: [employer] Prior Auth. Denial for Humulin 70/30  [employer] Prior [employer] Denial for Humulin 70/30   Imported By: Beau Fanny 10/23/2009 10:54:20  _____________________________________________________________________  External Attachment:    Type:   Image     Comment:   External Document

## 2010-11-04 NOTE — Medication Information (Signed)
Summary: rov/ewj  Anticoagulant Therapy  Managed by: [name], RN, BSN Referring MD: Charlies Constable MD PCP: [name], MD Supervising MD: Excell Seltzer MD, Casimiro Needle Indication 1: Atrial Fibrillation (ICD-427.31) Lab Used: [facility] [place] Site: [address] INR POC 2.3 INR RANGE 2 - 3  Dietary changes: no    Health status changes: no    Bleeding/hemorrhagic complications: no    Recent/future hospitalizations: no    Any changes in medication regimen? no    Recent/future dental: no  Any missed doses?: no       Is patient compliant with meds? yes       Allergies: 1)  ! Codeine Sulfate (Codeine Sulfate)  Anticoagulation Management History:      The patient is taking warfarin and comes in today for a routine follow up visit.  Positive risk factors for bleeding include an age of 5 years or older and presence of serious comorbidities.  The bleeding index is 'intermediate risk'.  Positive CHADS2 values include History of HTN, Age > 33 years old, and History of Diabetes.  The start date was 08/19/2006.  His last INR was 5.3 ratio.  Anticoagulation responsible Ghada Abbett: Excell Seltzer MD, Casimiro Needle.  INR POC: 2.3.  Cuvette Lot#: [id number].  Exp: 07/2011.    Anticoagulation Management Assessment/Plan:      The patient's current anticoagulation dose is Warfarin sodium 5 mg tabs: Use as directed by Anticoagulation Clinic.  The target INR is 2.0-3.0.  The next INR is due 06/05/2010.  Anticoagulation instructions were given to patient.  Results were reviewed/authorized by [name], RN, BSN.  He was notified by [name], RN, BSN.         Prior Anticoagulation Instructions: INR 1.8  Take 1.5 tablets today, then start taking 1 tablet daily except 1.5 tablets on Wednesdays and Sundays.    Current Anticoagulation Instructions: INR 2.3 Continue 1 pill everyday except 1.5 pills on Sundays and Wednesdays. Recheck in 2 weeks.

## 2010-11-04 NOTE — Medication Information (Signed)
Summary: [address]  Anticoagulant Therapy  Managed by: [name], RN, BSN Referring MD: Charlies Constable MD PCP: [name], MD Supervising MD: Tenny Craw MD, Gunnar Fusi Indication 1: Atrial Fibrillation (ICD-427.31) Lab Used: [facility] [place] Site: [address] INR POC 2.1 INR RANGE 2 - 3  Dietary changes: no    Health status changes: no    Bleeding/hemorrhagic complications: no    Recent/future hospitalizations: no    Any changes in medication regimen? no    Recent/future dental: no  Any missed doses?: no       Is patient compliant with meds? yes       Allergies: 1)  ! Codeine Sulfate (Codeine Sulfate)  Anticoagulation Management History:      The patient is taking warfarin and comes in today for a routine follow up visit.  Positive risk factors for bleeding include an age of 75 years or older and presence of serious comorbidities.  The bleeding index is 'intermediate risk'.  Positive CHADS2 values include History of HTN, Age > 40 years old, and History of Diabetes.  The start date was 08/19/2006.  His last INR was 5.3 ratio.  Anticoagulation responsible provider: Tenny Craw MD, Gunnar Fusi.  INR POC: 2.1.  Cuvette Lot#: [id number].  Exp: 02/2011.    Anticoagulation Management Assessment/Plan:      The target INR is 2.0-3.0.  The next INR is due 01/24/2010.  Anticoagulation instructions were given to patient.  Results were reviewed/authorized by [name], RN, BSN.  He was notified by [name], RN, BSN.         Prior Anticoagulation Instructions: INR 2.4  Continue 0.5 tab each Monday, Wednesday, Friday and 1 tab on all other days.  Recheck in 3 weeks.    Current Anticoagulation Instructions: INR 2.1 Continue 5mg s daily except 2.5mg s on Mondays, Wednesdays and Fridays. Recheck in 4 weeks.

## 2010-11-04 NOTE — Progress Notes (Signed)
Summary: Physical Therapy  Phone Note From Other Clinic   Caller: Katie @ [facility] [phone] Call For: Dr. Hetty Ely Summary of Call: [facility] calling to advise that PT will be put on hold until after pt has his gallbladder removed. Initial call taken by: Mervin Hack CMA Duncan Dull),  October 10, 2009 5:46 PM  Follow-up for Phone Call        Fine, thankyou. Follow-up by: Shaune Leeks MD,  October 10, 2009 6:07 PM

## 2010-11-04 NOTE — Medication Information (Signed)
Summary: [facility] Supp[facility]  [facility]   Imported By: Lanelle Bal 03/12/2010 08:40:21  _____________________________________________________________________  External Attachment:    Type:   Image     Comment:   External Document

## 2010-11-04 NOTE — Progress Notes (Signed)
Summary: form for diabetic supplies  Phone Note From Pharmacy   Caller: [facility] Summary of Call: Form for diabetic supplies is on your shelf.  I checked with the pt and he does want this. Initial call taken by: Lowella Petties CMA,  December 11, 2009 8:44 AM  Follow-up for Phone Call        Filled out. Follow-up by: Shaune Leeks MD,  December 11, 2009 9:13 AM  Additional Follow-up for Phone Call Additional follow up Details #1::        Form faxed, placed up front to be scanned. Additional Follow-up by: Lowella Petties CMA,  December 11, 2009 10:17 AM

## 2010-11-04 NOTE — Medication Information (Signed)
Summary: rov/sel  Anticoagulant Therapy  Managed by: Weston Brass, PharmD Referring MD: Charlies Constable MD PCP: [name], MD Supervising MD: Daleen Squibb MD, Maisie Fus Indication 1: Atrial Fibrillation (ICD-427.31) Lab Used: [facility] [place] Site: [address] INR POC 3.3 INR RANGE 2 - 3  Dietary changes: no    Health status changes: no    Bleeding/hemorrhagic complications: no    Recent/future hospitalizations: no    Any changes in medication regimen? yes    Recent/future dental: no  Any missed doses?: yes     Details: May of missed one dose about 2-3 weeks ago  Is patient compliant with meds? yes       Current Medications (verified): 1)  Avodart 0.5 Mg Caps (Dutasteride) .... Take 1 Capsule Once A Day 2)  Glyburide Micronized 6 Mg Tabs (Glyburide Micronized) .Marland Kitchen.. 1 By Mouth Two Times A Day 3)  Levothyroxine Sodium 25 Mcg Tabs (Levothyroxine Sodium) .Marland Kitchen.. 1 By Mouth Daily 4)  Lipitor 80 Mg  Tabs (Atorvastatin Calcium) .... One Tab By Mouth At Night. 5)  Digoxin 0.125 Mg Tabs (Digoxin) .Marland Kitchen.. 1 Tab Once Daily 6)  Nitrostat 0.3 Mg  Subl (Nitroglycerin) .... As Needed 7)  Ferro-Bob 325 (65 Fe) Mg  Tabs (Ferrous Sulfate) .Marland Kitchen.. 1 By Mouths A Day 8)  Bd Insulin Syringe Ultrafine 31g X 5/16" 0.5 Ml Misc (Insulin Syringe-Needle U-100) .... Use Three Times A Day 9)  Tylenol Extra Strength 500 Mg  Tabs (Acetaminophen) .... As Needed Pain Usually Helps Except At Night 10)  Carvedilol 25 Mg Tabs (Carvedilol) .Marland Kitchen.. 1 Tablet Twice A Day By Mouth 11)  Tramadol Hcl 50 Mg Tabs (Tramadol Hcl) .Marland Kitchen.. 1 Every 4 To 6 Hours As Needed Pain 12)  Pletal 100 Mg Tabs (Cilostazol) .... One Tab By Mouth Twice A Day 13)  Vitamin B-12 1000 Mcg Tabs (Cyanocobalamin) .... 2,000 Mg Once Daily 14)  Amlodipine Besylate 5 Mg Tabs (Amlodipine Besylate) .... Take One Tablet By Mouth Daily 15)  Novolog 100 Unit/ml Soln (Insulin Aspart) .... 35 Units Every Am, 35 Units Every Pm 16)  Levemir 100 Unit/ml Soln  (Insulin Detemir) .Marland Kitchen.. 15 Units Subcut Daily 17)  Warfarin Sodium 5 Mg Tabs (Warfarin Sodium) .... Use As Directed By Anticoagulation Clinic 18)  Singulair 10 Mg Tabs (Montelukast Sodium) .... Take 1 Tablet Everyday 19)  Aricept 10 Mg Tabs (Donepezil Hcl) .... One Tablet Once Daily  Allergies (verified): 1)  ! Codeine Sulfate (Codeine Sulfate)  Anticoagulation Management History:      The patient is taking warfarin and comes in today for a routine follow up visit.  Positive risk factors for bleeding include an age of 3 years or older and presence of serious comorbidities.  The bleeding index is 'intermediate risk'.  Positive CHADS2 values include History of HTN, Age > 66 years old, and History of Diabetes.  The start date was 08/19/2006.  His last INR was 5.3 ratio.  Anticoagulation responsible Dia Donate: Daleen Squibb MD, Maisie Fus.  INR POC: 3.3.  Cuvette Lot#: [id number].  Exp: 09/2011.    Anticoagulation Management Assessment/Plan:      The patient's current anticoagulation dose is Warfarin sodium 5 mg tabs: Use as directed by Anticoagulation Clinic.  The target INR is 2.0-3.0.  The next INR is due 08/27/2010.  Anticoagulation instructions were given to patient.  Results were reviewed/authorized by Weston Brass, PharmD.  He was notified by Hoy Register, PharmD Candidate.         Prior Anticoagulation Instructions: INR 3.0  Continue taking same dose of 1 tablet everyday except 1 1/2 tablet on Sunday and Wednesday. Recheck in 4 weeks.   Current Anticoagulation Instructions: INR 3.3  Hold today's dose then resume 1 tablet except 1.5 tablets on Sunday and Wednesday.  Recheck INR in 3 weeks.

## 2010-11-04 NOTE — Assessment & Plan Note (Signed)
Summary: Dr Dwain Sarna wants pt seen ASAP gallbladder issue./pl  [facility] pt   History of Present Illness Visit Type: Follow-up Consult Primary GI MD: Rob Bunting MD Primary Provider: [name], MD Requesting Provider: Emelia Loron, MD Chief Complaint: Bile duct leak. Pt is feeling fatigued and has N/V. Pt is very dizzy, and loss of appetitie.  History of Present Illness:   77 YO MALE REFERRED TODAY PER DR. WAKEFIELD FOR EVALUATION OF BILE LEAK WHICH WAS FOUND YESTERDAY ON HIDA SCAN. PT HAS MULTIPLE MEDICAL COMORBIDITIES INCLUDING IDDM,CAD,PVD,ATRIAL FIBRILLATION FOR WHICH HE IS ON CHRONIC COUMADIN. HE HAD A LONG HOSPITALIZATION WITH GANGRENOUS CHOLECYSTITIS,UNDERWENT ATTEMPTED LAP CHOLE THEN OPEN CHOLE WITH REMOVAL OF PART OF THE GB ON 10/25/09.J.P. PLACED.HE WAS DISCHARGED ON 10/30/09. HE HAS FELT POORLY OVER THE PAST WEEK,NO APPETITE,NAUSEA,OCCASIONAL VOMITING.HE IS FATIGUED AND NOW DIZZY OVER THE PAST FEW DAYS WHEN UP. STOOLS LOOSE 1-2  DAILY. NO DOCUMENTED FEVERS,SWEATS. HE SAYS THE DRAIN CHANGED COLOR A WEEK OR SO AGO,MORE GREENISH,AND THICK.  ON ARRIVAL HERE HE HAD TO LAY DONE,DIZZY-BP88/50,PULSE 104.  ,.T 97.2. NO RECENT LABS.   GI Review of Systems    Reports loss of appetite, nausea, and  vomiting.      Denies abdominal pain, acid reflux, belching, bloating, chest pain, dysphagia with liquids, dysphagia with solids, heartburn, vomiting blood, weight loss, and  weight gain.        Denies anal fissure, black tarry stools, change in bowel habit, constipation, diarrhea, diverticulosis, fecal incontinence, heme positive stool, hemorrhoids, irritable bowel syndrome, jaundice, light color stool, liver problems, rectal bleeding, and  rectal pain.    Current Medications (verified): 1)  Avodart 0.5 Mg Caps (Dutasteride) .... Take 1 Capsule Once A Day 2)  Glyburide Micronized 6 Mg Tabs (Glyburide Micronized) .Marland Kitchen.. 1 By Mouth Two Times A Day 3)  Levothyroxine Sodium 25 Mcg Tabs  (Levothyroxine Sodium) .Marland Kitchen.. 1 By Mouth Daily 4)  Lipitor 80 Mg  Tabs (Atorvastatin Calcium) .... One Tab By Mouth At Night. 5)  Digoxin 0.125 Mg Tabs (Digoxin) .Marland Kitchen.. 1 Tab Once Daily 6)  Prilosec 20 Mg Cpdr (Omeprazole) .Marland Kitchen.. 1 Daily By Mouth 7)  Nitrostat 0.3 Mg  Subl (Nitroglycerin) .... As Needed 8)  Ferro-Bob 325 (65 Fe) Mg  Tabs (Ferrous Sulfate) .Marland Kitchen.. 1 By Mouths A Day 9)  Accusure Insulin Syringe 31g X 5/16" 0.5 Ml  Misc (Insulin Syringe-Needle U-100) .... As Directed Two Times A Day 10)  Tylenol Extra Strength 500 Mg  Tabs (Acetaminophen) .... As Needed Pain Usually Helps Except At Night 11)  Carvedilol 25 Mg Tabs (Carvedilol) .Marland Kitchen.. 1 Tablet Twice A Day By Mouth 12)  Tramadol Hcl 50 Mg Tabs (Tramadol Hcl) .Marland Kitchen.. 1 Every 4 To 6 Hours As Needed Pain 13)  Pletal 100 Mg Tabs (Cilostazol) .... One Tab By Mouth Twice A Day 14)  Cyanocobalamin 1000 Mcg/ml Soln (Cyanocobalamin) .... Take Injection Every Other Week 15)  Nasonex 50 Mcg/act Susp (Mometasone Furoate) .... Once Daily 16)  Mucus Relief 400 Mg Tabs (Guaifenesin) .... As Needed 17)  Amlodipine Besylate 5 Mg Tabs (Amlodipine Besylate) .... Take One Tablet By Mouth Daily 18)  Novolog 100 Unit/ml Soln (Insulin Aspart) .... 35 Units Every Am, 35 Units Every Pm 19)  Oxycodone-Acetaminophen 5-500 Mg Caps (Oxycodone-Acetaminophen) .... As Needed 20)  Colcrys 0.6 Mg Tabs (Colchicine) .... 2 Tabs By Mouth X 1 Now, Then 1 Tab By Mouth An Hour Later.  Then Take One Tab By Mouth Two Times A Day. 21)  Tramadol Hcl  50 Mg  Tabs (Tramadol Hcl) .Marland Kitchen.. 1 Tab By Mouth Two Times A Day As Needed Pain.  Allergies (verified): 1)  ! Codeine Sulfate (Codeine Sulfate)  Past History:  Past Medical History: Reviewed history from 08/15/2008 and no changes required. Myocardial infarction, hx of (06/06/2003) Myocardial infarction, hx of (07/05/1996)   Current Problems:  HEMORRHOIDS (ICD-455.6) PRERENAL AZOTEMIA, MILD (ICD-790.6) GASTRITIS (ICD-535.50) HIATAL  HERNIA (ICD-553.3) GERD (ICD-530.81) RENAL INSUFFICIENCY (ICD-588.9) ATRIAL ARRHYTHMIAS (ICD-427.89) MI (ICD-410.90) RENAL INSUFFICIENCY, CHRONIC (ICD-585.9) HYPOPOTASSEMIA (ICD-276.8) MYOCARDIAL INFARCTION, HX OF (ICD-412) HYPOTHYROIDISM (ICD-244.9) ATRIAL FIBRILLATION ON COUMADIN (ICD-427.31) GOUT (ICD-274.9) B12 DEFICIENCY (ICD-266.2) ANEMIA, MILD (ICD-285.9) BENIGN PROSTATIC HYPERTROPHY, HX OF (ICD-V13.8) RENAL CALCULUS, RECURRENT LITHOTRIPSY (ICD-592.0) HYPERTENSION (ICD-401.9) DIVERTICULAR DISEASE VIA ACBE (ICD-562.10) HYPERLIPIDEMIA (ICD-272.4) PVD LE'S (ICD-443.9) DYSPNEA, CHRONIC (ICD-786.05) DM (ICD-250.00) CAD (MI 10/97, 9/04) (ICD-414.00)  1. Coronary artery disease status post remote diaphragmatic wall     infarction and status post drug-eluting stent to the right coronary     artery in 2004 for in-stent restenosis, now stable. 2. Good left ventricular function. 3. Paroxysmal atrial fibrillation treated with rate control     medications and Coumadin. 4. Hypertension. 5. Diabetes. 6. Hyperlipidemia. 7. Peripheral vascular disease status post bilateral renal stenting. 8. Renal insufficiency with a creatinine in the range of 2.   Past Surgical History: Reviewed history from 10/24/2009 and no changes required. CARDIOLITE NML 05/15/1997 ANGIOPLASTY, MULTIPLE 10/97 STRESS TEST 3/98 FLEX FISSURE   BE -DIVERTICS 9/98 LITHOTRIPSY DUE TO RENAL LITHIASIS 04/29/00 [facility] (ADMIT) R/O MI  9/16 - 06/26/03 CATH AND PICA  FOR IN-STENT RESTENOSIS 06/22/03 EGD - GASTRITIS 06/24/03 ADENOSINE CARDIOLITE NML EF 63% 12/25/03 BILAT. COMMON ILIAC STENTING 05/16/04 ADENOSINE MYOVIEW NML 02/12/06 [facility] PAFIB 11/15 - 08/21/2006 [facility], ELEVATED INR ARI 12/18-12/20/07 COLONOSCOPY, INTERNAL HEMORRHOIDS, NO POLYPS 11/09/06 EGD GASTRITIS ([facility]) 12/08/06 [facility]OSP Acute on Chronic Renal Insuff  L4/5 Herniated disc  12/17-12/19/08 L4/5 LAMINECTOMY (DR YATES) 09/28/07 CT ABD W/   NO ACUTE FINDINGS FATTY  LIVER  4CM R RENAL CYST  08/31/08 HOSP NEAR SYNCOPE  ORTHOST HYPOTENS  ACUTE CHOLEY SXS  ARF (Cr 3.16) 1/3-10/09/2009 CT ABD/PELVIS  POSS ACUTE CHOLEY 10/08/2009 HIDA SCAN  NONFILLING GB 10/09/2009 CHOLEYCYSTECTOMY, OPEN  ATTEMPTED LAP.Marland KitchenPURULENT GB  (DR WAKEFIELD) 10/23/2009  Family History: Reviewed history from 09/18/2008 and no changes required. Father dec 69 Ca ?skin Mother dec 64 MI Brother dec 64 MI  Brother A 67 CAD  Sister A 105 DM  Social History: Reviewed history from 09/18/2008 and no changes required. Occupation: [employer] super[employer], [employer] Retired Married lives w/ wife    3 children  Review of Systems       The patient complains of fatigue and shortness of breath.  The patient denies allergy/sinus, anemia, anxiety-new, arthritis/joint pain, back pain, blood in urine, breast changes/lumps, change in vision, confusion, cough, coughing up blood, depression-new, fainting, fever, headaches-new, hearing problems, heart murmur, heart rhythm changes, itching, menstrual pain, muscle pains/cramps, night sweats, nosebleeds, pregnancy symptoms, skin rash, sleeping problems, sore throat, swelling of feet/legs, swollen lymph glands, thirst - excessive , urination - excessive , urination changes/pain, urine leakage, vision changes, and voice change.         OTHERWISE AS IN HPI  Vital Signs:  Patient profile:   77 year old male Height:      69 inches Weight:      186.25 pounds Pulse rate:   100 / minute Pulse rhythm:   regular BP sitting:   88 / 46  (right arm) Cuff size:  regular  Vitals Entered By: Christie Nottingham CMA Duncan Dull) (November 14, 2009 10:13 AM)  Physical Exam  General:  Well developed, FRAIL,ILL APPEARING Head:  Normocephalic and atraumatic. Eyes:  SLIGHT ICTERUS Lungs:  Clear throughout to auscultation. Heart:  Regular rate and rhythm; no murmurs, rubs,  or bruits.TACHY Abdomen:  SOFT, NONDISTENDED, NO FOCAL TENDERNESS,J.P. DRAINING PURULENT LOOKING MATERIAL. Rectal:  NOT  DONE Extremities:  TRACE EDEMA Neurologic:  Alert and  oriented x4;  grossly normal neurologically.weakness noted.   Psych:  Alert and cooperative. Normal mood and affect.   Impression & Recommendations:  Problem # 1:  ABNORMAL EXAM-BILIARY TRACT (ICD-793.3) Assessment New 77 YO MALE WITH BILE LEAK DOCUMENTED WITH HIDA SCAN 10/13/09. PT S/P COMPLICATED PARTIAL CHOLECYSTECTOMY 10/25/09.PT IS HYPOTENSIVE ,TACHYCARDIC WITH PURULENT DRAINAGE FROM J.P. R/O SEPSIS.,IN PT WITH MULTIPLE OTHER COMORBID MEDICAL PROBLEMS.  DISCUSSED WITH DR. Virgina Norfolk WILL BE ADMITTED THRU THE HOSPITALIST SERVICE AND FOLLOWED BY G.I  LABS,CULTURES IV ZOSYN WILL NEED ERCP WITH STENT-POTENTIALLY TOMORROW DEPENDING ON HIS STATUS,COUMADIN ON HOLD PAST X 24 HOURS THOUGH INR MAY BE HIGH IF INFECTED DR WAKEFIELD  TO BE CONSULTED. SEE ORDERS.  Problem # 2:  COUMADIN THERAPY (ICD-V58.61) Assessment: Comment Only  Problem # 3:  RENAL ARTERY STENOSIS (ICD-440.1) Assessment: Comment Only  Problem # 4:  CAD, NATIVE VESSEL (ICD-414.01) Assessment: Comment Only  Problem # 5:  MYOCARDIAL INFARCTION, HX OF (ICD-412) Assessment: Comment Only  Problem # 6:  ATRIAL FIBRILLATION ON COUMADIN (ICD-427.31) Assessment: Comment Only  Problem # 7:  BENIGN PROSTATIC HYPERTROPHY, HX OF (ICD-V13.8) Assessment: Comment Only  Problem # 8:  DM (ICD-250.00) Assessment: Comment Only INSULIN DEPENDENT

## 2010-11-04 NOTE — Progress Notes (Signed)
Summary: Request for cardiac clearance (call pt 1/6 with appt 1/11)   Phone Note Other Incoming   Caller: Dr. Doreen Salvage office Summary of Call: Per Dr. Doreen Salvage office, the pt was seen in the ER today and is now set up for cholecysectomy on 10/18/09. Dr. Dwain Sarna has stopped the pt's coumadin as of today in preparation for the procedure on 1/14. They are going to fax over a request for clearance on the pt. Sherri Rad, RN, BSN  October 09, 2009 3:54 PM  I discussed with Dr. Clifton James - the pt's need for cardiac clearance- recommendations are for the pt to come in for an office visit prior to surgery. I have scheduled the pt an appt. on 10/15/09 with Dr. Excell Seltzer (DOD). I attempted to call the pt, but his line is busy. I will have the triage nurse call him tomorrow regarding his appt. Sherri Rad, RN, BSN  October 09, 2009 6:33 PM   Follow-up for Phone Call        Spoke with patient. Pt. notified of appointment on 10/15/09 with Dr. Excell Seltzer (DOD) at 12:00 noon for surgical clearance. Pt. verbalized understanding. Follow-up by: Ollen Gross, RN, BSN,  October 10, 2009 11:30 AM

## 2010-11-04 NOTE — Letter (Signed)
Summary: Dr.Matthew Kyra Manges Long,Consultation Note  Dr.Matthew Kyra Manges Long,Consultation Note   Imported By: Beau Fanny 10/10/2009 11:07:33  _____________________________________________________________________  External Attachment:    Type:   Image     Comment:   External Document

## 2010-11-04 NOTE — Medication Information (Signed)
Summary: [facility]  [facility]   Imported By: Sherian Rein 07/17/2010 09:57:37  _____________________________________________________________________  External Attachment:    Type:   Image     Comment:   External Document

## 2010-11-04 NOTE — Medication Information (Signed)
Summary: Coumadin Clinic  Anticoagulant Therapy  Managed by: Cloyde Reams, RN, BSN Referring MD: Charlies Constable MD PCP: [name], MD Supervising MD: Excell Seltzer MD, Casimiro Needle Indication 1: Atrial Fibrillation (ICD-427.31) Lab Used: [facility] Site: [address] PT 17.5 INR POC 1.8 INR RANGE 2 - 3    Bleeding/hemorrhagic complications: no     Any changes in medication regimen? no     Any missed doses?: no         Allergies: 1)  ! Codeine Sulfate (Codeine Sulfate)  Anticoagulation Management History:      His anticoagulation is being managed by telephone today.  Positive risk factors for bleeding include an age of 75 years or older and presence of serious comorbidities.  The bleeding index is 'intermediate risk'.  Positive CHADS2 values include History of HTN, Age > 75 years old, and History of Diabetes.  The start date was 08/19/2006.  His last INR was 5.3 ratio.  Prothrombin time is 17.5.  Anticoagulation responsible provider: Excell Seltzer MD, Casimiro Needle.  INR POC: 1.8.  Exp: 11/2010.    Anticoagulation Management Assessment/Plan:      The patient's current anticoagulation dose is Coumadin 5 mg tabs: Take as directed by coumadin clinic. ON HOLD.  The target INR is 2.0-3.0.  The next INR is due 11/18/2009.  Anticoagulation instructions were given to [place], RN [facility].  Results were reviewed/authorized by Cloyde Reams, RN, BSN.  He was notified by Cloyde Reams RN.         Prior Anticoagulation Instructions: INR 1.3  Called spoke with pt.  Instructed pt to take 1 tablet today then resume same dosage 1 tablet daily except 1/2 tablet on Mondays, Wednesdays, and Fridays.  Recheck in 1 week.  Called Melissa with [facility] advised to recheck in 1 week.  Current Anticoagulation Instructions: INR 1.8  Spoke with Efraim Kaufmann, RN [facility] while at pt's home.  Advised to have pt take 1 tablet today then resume same dosage 1 tablet daily except 1/2 tablet on MWF.  Recheck in 1 week.

## 2010-11-04 NOTE — Medication Information (Signed)
Summary: rov/tm  Anticoagulant Therapy  Managed by: Cloyde Reams, RN, BSN Referring MD: Charlies Constable MD PCP: [name], MD Supervising MD: Clifton James MD, Cristal Deer Indication 1: Atrial Fibrillation (ICD-427.31) Lab Used: [facility] [place] Site: [address] INR POC 1.8 INR RANGE 2 - 3  Dietary changes: no    Health status changes: yes       Details: feet swelling some, better  Bleeding/hemorrhagic complications: no    Recent/future hospitalizations: no    Any changes in medication regimen? no    Recent/future dental: no  Any missed doses?: no       Is patient compliant with meds? yes       Allergies: 1)  ! Codeine Sulfate (Codeine Sulfate)  Anticoagulation Management History:      The patient is taking warfarin and comes in today for a routine follow up visit.  Positive risk factors for bleeding include an age of 19 years or older and presence of serious comorbidities.  The bleeding index is 'intermediate risk'.  Positive CHADS2 values include History of HTN, Age > 44 years old, and History of Diabetes.  The start date was 08/19/2006.  His last INR was 5.3 ratio.  Anticoagulation responsible provider: Clifton James MD, Cristal Deer.  INR POC: 1.8.  Cuvette Lot#: [id number].  Exp: 07/2011.    Anticoagulation Management Assessment/Plan:      The patient's current anticoagulation dose is Warfarin sodium 5 mg tabs: Use as directed by Anticoagulation Clinic.  The target INR is 2.0-3.0.  The next INR is due 05/22/2010.  Anticoagulation instructions were given to patient.  Results were reviewed/authorized by Cloyde Reams, RN, BSN.  He was notified by Cloyde Reams RN.         Prior Anticoagulation Instructions: INR 1.4 Today take 1 1/2 pills, then change dose to 1 pill everyday except 1 1/2 pills on Wednesdays.  Recheck in 10 days.   Current Anticoagulation Instructions: INR 1.8  Take 1.5 tablets today, then start taking 1 tablet daily except 1.5 tablets on  Wednesdays and Sundays.

## 2010-11-04 NOTE — Assessment & Plan Note (Signed)
Summary: F/U SUGAR/CLE   Vital Signs:  Patient profile:   77 year old male Weight:      201.25 pounds Temp:     98.4 degrees F oral Pulse rate:   84 / minute Pulse rhythm:   regular BP sitting:   138 / 68  (left arm) Cuff size:   large  Vitals Entered By: Sydell Axon LPN (Feb 26, 2010 2:43 PM) CC: Follow-up on blood sugar, blood sugar has been running around 200 and head has been stopped up   History of Present Illness: Pt here because his sugars have gone up to routinely be 200 or so regularly throughtout the day. He checks his no. twice a day sometimes three times and since being in the hosp, his nos have been higher. He is also stopped up consistently for the last three to four weeks. He otherwise feels fine.  Problems Prior to Update: 1)  Coumadin Therapy  (ICD-V58.61) 2)  Nausea and Vomiting  (ICD-787.01) 3)  Abnormal Exam-biliary Tract  (ICD-793.3) 4)  Renal Failure, Acute  (ICD-584.9) 5)  Acute Gouty Arthropathy  (ICD-274.01) 6)  Toe Pain  (ICD-729.5) 7)  Postural Lightheadedness  (ICD-780.4) 8)  Obesity  (ICD-278.00) 9)  Allergic Rhinitis  (ICD-477.9) 10)  Abdominal Pain-multiple Sites  (ICD-789.09) 11)  Abdominal Pain, Epigastric  (ICD-789.06) 12)  Fatty Liver Disease  (ICD-571.8) 13)  Renal Artery Stenosis  (ICD-440.1) 14)  Cad, Native Vessel  (ICD-414.01) 15)  Back Pain  (ICD-724.5) 16)  Hemorrhoids  (ICD-455.6) 17)  Prerenal Azotemia, Mild  (ICD-790.6) 18)  Gastritis  (ICD-535.50) 19)  Hiatal Hernia  (ICD-553.3) 20)  Gerd  (ICD-530.81) 21)  Renal Insufficiency Avoid Nsaids  (ICD-588.9) 22)  Atrial Arrhythmias  (ICD-427.89) 23)  Mi  (ICD-410.90) 24)  Myocardial Infarction, Hx of  (ICD-412) 25)  Hypothyroidism  (ICD-244.9) 26)  Atrial Fibrillation On Coumadin  (ICD-427.31) 27)  Gout  (ICD-274.9) 28)  B12 Deficiency  (ICD-266.2) 29)  Anemia, Mild  (ICD-285.9) 30)  Benign Prostatic Hypertrophy, Hx of  (ICD-V13.8) 31)  Renal Calculus, Recurrent Lithotripsy   (ICD-592.0) 32)  Hypertension  (ICD-401.9) 33)  Diverticular Disease Via Acbe  (ICD-562.10) 34)  Hyperlipidemia  (ICD-272.4) 35)  Pvd Le's  (ICD-443.9) 36)  Dyspnea, Chronic  (ICD-786.05) 37)  Dm  (ICD-250.00) 38)  Cad (MI 10/97, 9/04)  (ICD-414.00)  Medications Prior to Update: 1)  Avodart 0.5 Mg Caps (Dutasteride) .... Take 1 Capsule Once A Day 2)  Glyburide Micronized 6 Mg Tabs (Glyburide Micronized) .Marland Kitchen.. 1 By Mouth Two Times A Day 3)  Levothyroxine Sodium 25 Mcg Tabs (Levothyroxine Sodium) .Marland Kitchen.. 1 By Mouth Daily 4)  Lipitor 80 Mg  Tabs (Atorvastatin Calcium) .... One Tab By Mouth At Night. 5)  Digoxin 0.125 Mg Tabs (Digoxin) .Marland Kitchen.. 1 Tab Once Daily 6)  Prilosec 20 Mg Cpdr (Omeprazole) .Marland Kitchen.. 1 Daily By Mouth 7)  Nitrostat 0.3 Mg  Subl (Nitroglycerin) .... As Needed 8)  Ferro-Bob 325 (65 Fe) Mg  Tabs (Ferrous Sulfate) .Marland Kitchen.. 1 By Mouths A Day 9)  Bd Insulin Syringe Ultrafine 31g X 5/16" 0.5 Ml Misc (Insulin Syringe-Needle U-100) .... Use Twice A Day 10)  Tylenol Extra Strength 500 Mg  Tabs (Acetaminophen) .... As Needed Pain Usually Helps Except At Night 11)  Carvedilol 25 Mg Tabs (Carvedilol) .Marland Kitchen.. 1 Tablet Twice A Day By Mouth 12)  Tramadol Hcl 50 Mg Tabs (Tramadol Hcl) .Marland Kitchen.. 1 Every 4 To 6 Hours As Needed Pain 13)  Pletal 100 Mg Tabs (Cilostazol) .Marland KitchenMarland KitchenMarland Kitchen  One Tab By Mouth Twice A Day 14)  Cyanocobalamin 1000 Mcg/ml Soln (Cyanocobalamin) .... Take Injection Every Other Week 15)  Nasonex 50 Mcg/act Susp (Mometasone Furoate) .... Once Daily 16)  Mucus Relief 400 Mg Tabs (Guaifenesin) .... As Needed 17)  Amlodipine Besylate 5 Mg Tabs (Amlodipine Besylate) .... Take One Tablet By Mouth Daily 18)  Novolog 100 Unit/ml Soln (Insulin Aspart) .... 35 Units Every Am, 35 Units Every Pm 19)  Oxycodone-Acetaminophen 5-500 Mg Caps (Oxycodone-Acetaminophen) .... As Needed  Allergies: 1)  ! Codeine Sulfate (Codeine Sulfate)  Physical Exam  General:  Well-developed,well-nourished,in mild  distress from  presumed fatigue; alert,appropriate and cooperative throughout examination, obese. Head:  Normocephalic and atraumatic without obvious abnormalities. No apparent alopecia or balding, is having thinning of hair. Sinuses NT. Eyes:  Conjunctiva mildly inflamed  bilaterallyin palpebral distrib, good tearing.  Ears:  External ear exam shows no significant lesions or deformities.  Otoscopic examination reveals clear canals, tympanic membranes are intact bilaterally without bulging, retraction, inflammation or discharge. Hearing is grossly normal bilaterally. Signif cerumen bilat. Nose:  External nasal examination shows no deformity or inflammation. Nasal mucosa are pink and moist without lesions or exudates but mild inflammation.. Minimal inflammation. Mouth:  MMM Neck:  No deformities, masses, or tenderness noted. Chest Wall:  No deformities, masses, tenderness or gynecomastia noted. Lungs:  Normal respiratory effort, chest expands symmetrically. Lungs are clear to auscultation, no crackles or wheezes. Heart:  Normal rate and regular rhythm. S1 and S2 normal without gallop, murmur, click, rub or other extra sounds.   Impression & Recommendations:  Problem # 1:  DM (ICD-250.00) Assessment Deteriorated  Add Levemir to curr regimen.Marland KitchenMarland KitchenStart with 5 units. His updated medication list for this problem includes:    Glyburide Micronized 6 Mg Tabs (Glyburide micronized) .Marland Kitchen... 1 by mouth two times a day    Novolog 100 Unit/ml Soln (Insulin aspart) .Marland KitchenMarland KitchenMarland KitchenMarland Kitchen 35 units every am, 35 units every pm    Levemir 100 Unit/ml Soln (Insulin detemir) .Marland KitchenMarland KitchenMarland KitchenMarland Kitchen 5 units subcut daily  Labs Reviewed: Creat: 1.7 (11/07/2009)   Microalbumin: 3.9 (02/02/2006)  Last Eye Exam: normal (03/20/2008) Reviewed HgBA1c results: 10.1 (09/25/2009)  10.2 (06/19/2009)  Problem # 2:  ALLERGIC RHINITIS (ICD-477.9) Assessment: Unchanged  Congested, try adding Singulair...given samps. Recheck next time. His updated medication list for this  problem includes:    Nasonex 50 Mcg/act Susp (Mometasone furoate) ..... Once daily  Discussed use of allergy medications and environmental measures.   Problem # 3:  B12 DEFICIENCY (ICD-266.2) Assessment: Unchanged On oral therapy. Check lvel next time and decide about IM repl.  Complete Medication List: 1)  Avodart 0.5 Mg Caps (Dutasteride) .... Take 1 capsule once a day 2)  Glyburide Micronized 6 Mg Tabs (Glyburide micronized) .Marland Kitchen.. 1 by mouth two times a day 3)  Levothyroxine Sodium 25 Mcg Tabs (Levothyroxine sodium) .Marland Kitchen.. 1 by mouth daily 4)  Lipitor 80 Mg Tabs (Atorvastatin calcium) .... One tab by mouth at night. 5)  Digoxin 0.125 Mg Tabs (Digoxin) .Marland Kitchen.. 1 tab once daily 6)  Prilosec 20 Mg Cpdr (Omeprazole) .Marland Kitchen.. 1 daily by mouth 7)  Nitrostat 0.3 Mg Subl (Nitroglycerin) .... As needed 8)  Ferro-bob 325 (65 Fe) Mg Tabs (Ferrous sulfate) .Marland Kitchen.. 1 by mouths a day 9)  Bd Insulin Syringe Ultrafine 31g X 5/16" 0.5 Ml Misc (Insulin syringe-needle u-100) .... Use twice a day 10)  Tylenol Extra Strength 500 Mg Tabs (Acetaminophen) .... As needed pain usually helps except at night 11)  Carvedilol 25 Mg  Tabs (Carvedilol) .Marland Kitchen.. 1 tablet twice a day by mouth 12)  Tramadol Hcl 50 Mg Tabs (Tramadol hcl) .Marland Kitchen.. 1 every 4 to 6 hours as needed pain 13)  Pletal 100 Mg Tabs (Cilostazol) .... One tab by mouth twice a day 14)  Cyanocobalamin 1000 Mcg/ml Soln (Cyanocobalamin) .... Take injection every other week 15)  Nasonex 50 Mcg/act Susp (Mometasone furoate) .... Once daily 16)  Mucus Relief 400 Mg Tabs (Guaifenesin) .... As needed 17)  Amlodipine Besylate 5 Mg Tabs (Amlodipine besylate) .... Take one tablet by mouth daily 18)  Novolog 100 Unit/ml Soln (Insulin aspart) .... 35 units every am, 35 units every pm 19)  Oxycodone-acetaminophen 5-500 Mg Caps (Oxycodone-acetaminophen) .... As needed 20)  Levemir 100 Unit/ml Soln (Insulin detemir) .... 5 units subcut daily  Patient Instructions: 1)  RTC 3  weeks. 2)  Do not use Afrin Nasal Spray. 3)  Start Levemir 5 units once a day. 4)  Cont Humalog as is. 5)  Try Singulair 10 daily (given samps) Prescriptions: LEVEMIR 100 UNIT/ML SOLN (INSULIN DETEMIR) 5 units subcut daily  #1 vial x 12   Entered and Authorized by:   Shaune Leeks MD   Signed by:   Shaune Leeks MD on 02/26/2010   Method used:   Electronically to        [id number] (retail)       [address]       [place], [place]  [id number]       Ph: [phone]       Fax: [phone]   RxID:   [id number]   Current Allergies (reviewed today): ! CODEINE SULFATE (CODEINE SULFATE)

## 2010-11-04 NOTE — Discharge Summary (Signed)
Summary: Discharge       NAME:  Justin Lane, Justin Lane NO.:  [id number]      MEDICAL RECORD NO.:  [id number]          PATIENT TYPE:  INP      LOCATION:  5503                         FACILITY:  [facility]      PHYSICIAN:  Juanetta Gosling, MDDATE OF BIRTH:  09-Oct-1929      DATE OF ADMISSION:  10/23/2009   DATE OF DISCHARGE:  10/30/2009                                  DISCHARGE SUMMARY      ADMISSION DIAGNOSES:   1. Coronary artery disease status post a myocardial infarction.   2. Hiatal hernia/gastroesophageal reflux disease.   3. Chronic renal insufficiency.   4. Hypothyroidism.   5. Atrial fibrillation on Coumadin.   6. Benign prostatic.   7. Hypertension.   8. Diabetes.   9. Chronic cholecystitis.      DISCHARGE DIAGNOSES:   1. Coronary artery disease status post a myocardial infarction.   2. Hiatal hernia/gastroesophageal reflux disease.   3. Chronic renal insufficiency.   4. Hypothyroidism.   5. Atrial fibrillation on Coumadin.   6. Benign prostatic.   7. Hypertension.   8. Diabetes.   9. Chronic cholecystitis.      PROCEDURES PERFORMED:  October 23, 2009, attempt at laparoscopic   cholecystectomy followed by open partial cholecystectomy for gangrenous   cholecystitis.      HISTORY/HOSPITAL COURSE:  Mr. Taft is a 77 year old male who I saw   about 2 weeks prior to surgery at [facility] for what appeared to be   acute on chronic cholecystitis.  He had no pain and was tolerating a   diet when I saw him with a normal white blood cell count.  He did have a   HIDA scan that showed non-filling gallbladder.  He was on Coumadin at   that time and I had him evaluated by his cardiologist, Dr. Charlies Constable,   and then brought him back about 2 weeks later after he underwent further   evaluation by Cardiology and was off his Coumadin.  I placed him on   antibiotics for the time being.  I took him to the operating room on   October 23, 2009, where I attempted to  do a laparoscopic   cholecystectomy.  I was unable to really visualize his gallbladder, and   a fair amount of his omentum as well as his transverse colon was   adherent to his liver and his gallbladder.  I was able to remove some of   this.  When I attempting to grab the gallbladder, there was pus from his   gallbladder.  I was eventually able to dissect down to where his   triangle of Calot was, but I was really unable to obtain a critical view   of safety and dissect the cystic duct free from what appeared to be his   common duct and this was a very wide junction.  Due to that, I then did   an open partial cholecystectomy.  His pathology returned as chronic   cholecystitis associated with  focal fibrosis.  Postoperatively, he had a   fairly long hospital course.  Overnight, he had an episode of shortness   of breath which was treated with some diuretic, then the following day   he was continued n.p.o. maintaining on a PCA, out of bed on pulmonary   toilet.  I did hold his Coumadin due to some bleeding in his liver bed   if at risk for that until about postoperative day 5.  His liver function   tests increased to bilirubin 4.2.  Immediately postoperatively, his   creatinine increased to 1.53.  I consulted Gastroenterology at this   point and they followed his LFTs with me.  The following night he again   had some crackles at his base.  He was administered some Lasix and did   better with that as well.  He was maintained on Zosyn for about 5 days   postoperatively as well.  His LFTs began to trend down and they   eventually reached normal prior to him being discharged.  He did have a   chest x-ray that showed low lung volumes as well as consistent with   pulmonary edema.  CBGs remained under fair control during his hospital   stay.  He did undergo an ultrasound per Gastroenterology that was fairly   unremarkable given his postoperative state.  He was also evaluated by   Physical Therapy  and they have recommended home health PT when he was   discharged.  By postoperative day 3, he was tolerating clears, voiding,   was passing gas, and up with physical therapy.  His liver function tests   still contracted down to normal.  I was still administering Lasix at   this time.  He had a JP that remained in place during this time that was   draining nonbilious fluid.  The Gastroenterology Service signed off.  By   postoperative day 5, he was tolerating a regular diet.  He was dizzy a   little bit when he was walking.  I did restart his Coumadin at that   time.  He did have some asymptomatic V-tach on the monitor and was   evaluated by Cardiology.  His electrolytes were fixed.  He was pulled   out for an MI and this required no further evaluation per Cardiology.   On postoperative day 7, I eventually discharged in with drain with   follow up scheduled.  His follow ups are with the Coumadin Clinic in 1   week, [facility] at [phone] in 1 week, and with Dr. Charlies Constable to call for an appointment.      MEDICATIONS UPON DISCHARGE:   1. Percocet as needed.   2. Avodart 0.5 daily 6 mg b.i.d.   3. Levothyroxine 25 mcg daily.   4. Lipitor 80 mg at bedtime.   5. digoxin 0.125 daily.   6. Prilosec 20 mg a day.   7. Nitrostat p.r.n.   8. Aspirin 81 mg daily.   9. Humulin 70/30 thirty five units q.a.m. and q.p.m.   10.Tylenol p.r.n.   11.Carvedilol 25 mg b.i.d.   12.Tramadol as needed.   13.Pletal 100 mg b.i.d.   14.Coumadin 5 mg daily.   15.Vitamin B12 q. other week.   16.Nasonex daily.   17.Guaifenesin p.r.n.   18.Amlodipine 5 mg daily.      His pertinent radiologic evaluation prior to surgery on October 18, 2009, he had a fairly unremarkable chest  x-ray on October 24, 2009.  His   chest x-ray showed mildly enlarged heart but stable low lungs volumes   with vascular crowding and atelectasis.  Ultrasound on October 25, 2009,   showed him to be status post  cholecystectomy with a small amount of   perihepatic and perisplenic free fluid consistent with his operation.      CONSULTS:   1. Gastroenterology.   2. Physical Therapy.      PERTINENT LABORATORY EVALUATION:  His admission hematocrit was 34.0,   discharge was 28.7.  His discharge INR was 1.07.  His admission   creatinine was 1.43, it reached a high of 1.53, on discharge it was   1.29.  His total bilirubin on admission was 0.3, it was a high of 4.2 on   October 24, 2009, on discharge it was 0.9.               Juanetta Gosling, MD            MCW/MEDQ  D:  11/12/2009  T:  11/13/2009  Job:  [id number]         Electronically Signed by Emelia Loron MD on 11/13/2009 08:36:05 AM

## 2010-11-04 NOTE — Progress Notes (Signed)
Summary: needs permission to stop coumadin   Phone Note From Other Clinic Call back at [phone]- ask for Jade   Caller: Jade from Dr. Doreen Salvage office  Call For: Dr. Ermalene Searing  Summary of Call: This is one of Dr. Lyndel Pleasure patients and he is going in for surgery on the 14th. They are asking permission to stop his coumadin for 5 days prior to surgery which means he would need to stop his coumadin Sunday. Please advise.  Initial call taken by: Lewanda Rife LPN,  October 11, 2009 3:57 PM  Follow-up for Phone Call        Notify pt that there is some increased risk of cltos stopping coumadin, but if benifit of surgery high..it would outweight this risk. okay to hold coumadin.  Follow-up by: Kerby [name] MD,  October 11, 2009 5:11 PM  Additional Follow-up for Phone Call Additional follow up Details #1::        Patient is seeing Cardiologist (Dr. Excell Seltzer) tomorrow.  They will also get clearance for surgery from him.  Delilah Shan CMA Duncan Dull)  October 14, 2009 4:39 PM

## 2010-11-04 NOTE — Medication Information (Signed)
Summary: rov/tm  Anticoagulant Therapy  Managed by: Weston Brass, PharmD Referring MD: Charlies Constable MD PCP: [name], MD Supervising MD: Graciela Husbands MD,Steven Indication 1: Atrial Fibrillation (ICD-427.31) Lab Used: [facility] [place] Site: [address] INR POC 1.9 INR RANGE 2 - 3  Dietary changes: no    Health status changes: no    Bleeding/hemorrhagic complications: no    Recent/future hospitalizations: no    Any changes in medication regimen? no    Recent/future dental: no  Any missed doses?: yes     Details: missed last night  Is patient compliant with meds? yes       Allergies: 1)  ! Codeine Sulfate (Codeine Sulfate)  Anticoagulation Management History:      Positive risk factors for bleeding include an age of 75 years or older and presence of serious comorbidities.  The bleeding index is 'intermediate risk'.  Positive CHADS2 values include History of HTN, Age > 1 years old, and History of Diabetes.  The start date was 08/19/2006.  His last INR was 5.3 ratio.  Anticoagulation responsible provider: Graciela Husbands MD,Steven.  INR POC: 1.9.  Cuvette Lot#: [id number].  Exp: 07/2011.    Anticoagulation Management Assessment/Plan:      The patient's current anticoagulation dose is Warfarin sodium 5 mg tabs: Use as directed by Anticoagulation Clinic.  The target INR is 2.0-3.0.  The next INR is due 06/19/2010.  Anticoagulation instructions were given to patient.  Results were reviewed/authorized by Weston Brass, PharmD.  He was notified by Harrel Carina.         Prior Anticoagulation Instructions: INR 2.3 Continue 1 pill everyday except 1.5 pills on Sundays and Wednesdays. Recheck in 2 weeks.   Current Anticoagulation Instructions: INR 1.9  Take two tablets today only, then continue taking one tablet every day except 1 1/2 tablets on Sunday and Wednesday.   Re-check INR on 06/19/10.

## 2010-11-04 NOTE — Medication Information (Signed)
Summary: rov/sp  Anticoagulant Therapy  Managed by: [name], RN, BSN Referring MD: Charlies Constable MD PCP: [name], MD Supervising MD: Excell Seltzer MD, Casimiro Needle Indication 1: Atrial Fibrillation (ICD-427.31) Lab Used: [facility] [place] Site: [address] INR RANGE 2 - 3  Dietary changes: no    Health status changes: no    Bleeding/hemorrhagic complications: no    Recent/future hospitalizations: no    Any changes in medication regimen? no    Recent/future dental: no  Any missed doses?: yes     Details: Missed Sunday dose  Is patient compliant with meds? yes       Current Medications (verified): 1)  Avodart 0.5 Mg Caps (Dutasteride) .... Take 1 Capsule Once A Day 2)  Glyburide Micronized 6 Mg Tabs (Glyburide Micronized) .Marland Kitchen.. 1 By Mouth Two Times A Day 3)  Levothyroxine Sodium 25 Mcg Tabs (Levothyroxine Sodium) .Marland Kitchen.. 1 By Mouth Daily 4)  Lipitor 80 Mg  Tabs (Atorvastatin Calcium) .... One Tab By Mouth At Night. 5)  Digoxin 0.125 Mg Tabs (Digoxin) .Marland Kitchen.. 1 Tab Once Daily 6)  Nitrostat 0.3 Mg  Subl (Nitroglycerin) .... As Needed 7)  Ferro-Bob 325 (65 Fe) Mg  Tabs (Ferrous Sulfate) .Marland Kitchen.. 1 By Mouths A Day 8)  Bd Insulin Syringe Ultrafine 31g X 5/16" 0.5 Ml Misc (Insulin Syringe-Needle U-100) .... Use Twice A Day 9)  Tylenol Extra Strength 500 Mg  Tabs (Acetaminophen) .... As Needed Pain Usually Helps Except At Night 10)  Carvedilol 25 Mg Tabs (Carvedilol) .Marland Kitchen.. 1 Tablet Twice A Day By Mouth 11)  Tramadol Hcl 50 Mg Tabs (Tramadol Hcl) .Marland Kitchen.. 1 Every 4 To 6 Hours As Needed Pain 12)  Pletal 100 Mg Tabs (Cilostazol) .... One Tab By Mouth Twice A Day 13)  Vitamin B-12 1000 Mcg Tabs (Cyanocobalamin) .... Take  1 Tablet Daily 14)  Nasonex 50 Mcg/act Susp (Mometasone Furoate) .... Once Daily 15)  Mucus Relief 400 Mg Tabs (Guaifenesin) .... As Needed 16)  Amlodipine Besylate 5 Mg Tabs (Amlodipine Besylate) .... Take One Tablet By Mouth Daily 17)  Novolog 100 Unit/ml Soln  (Insulin Aspart) .... 35 Units Every Am, 35 Units Every Pm 18)  Oxycodone-Acetaminophen 5-500 Mg Caps (Oxycodone-Acetaminophen) .... As Needed 19)  Levemir 100 Unit/ml Soln (Insulin Detemir) .... 5 Units Subcut Daily 20)  Warfarin Sodium 5 Mg Tabs (Warfarin Sodium) .... Use As Directed By Anticoagulation Clinic 21)  Singulair 10 Mg Tabs (Montelukast Sodium) .... Take 1 Tablet Everyday  Allergies: 1)  ! Codeine Sulfate (Codeine Sulfate)  Anticoagulation Management History:      The patient is taking warfarin and comes in today for a routine follow up visit.  Positive risk factors for bleeding include an age of 75 years or older and presence of serious comorbidities.  The bleeding index is 'intermediate risk'.  Positive CHADS2 values include History of HTN, Age > 47 years old, and History of Diabetes.  The start date was 08/19/2006.  His last INR was 5.3 ratio.  Anticoagulation responsible provider: Excell Seltzer MD, Casimiro Needle.  Cuvette Lot#: [id number].  Exp: 05/2011.    Anticoagulation Management Assessment/Plan:      The patient's current anticoagulation dose is Warfarin sodium 5 mg tabs: Use as directed by Anticoagulation Clinic.  The target INR is 2.0-3.0.  The next INR is due 03/31/2010.  Anticoagulation instructions were given to patient.  Results were reviewed/authorized by [name], RN, BSN.  He was notified by [name], RN, BSN.  Prior Anticoagulation Instructions: INR 1.7  Take 1 1/2 tablets tonight then resume same schedule of 1 tablet every day except 1/2 tablet on Monday, Wednesday and Friday   Current Anticoagulation Instructions: INR 1.2 Today take 1 1/2  pills and on Wednesday take 1 pill then resume 1 pill everyday except 1/2 pill on Mondays, Wednesdays and Fridays. Recheck in 2 weeks.

## 2010-11-04 NOTE — Medication Information (Signed)
Summary: rov/jaj  Anticoagulant Therapy  Managed by: [name], RN, BSN Referring MD: Charlies Constable MD PCP: [name], MD Supervising MD: Shirlee Latch MD, Ariahna Smiddy Indication 1: Atrial Fibrillation (ICD-427.31) Lab Used: [facility] [place] Site: [address] INR POC 2.9 INR RANGE 2 - 3  Dietary changes: no    Health status changes: no    Bleeding/hemorrhagic complications: no    Recent/future hospitalizations: no    Any changes in medication regimen? no    Recent/future dental: no  Any missed doses?: no       Is patient compliant with meds? yes       Allergies: 1)  ! Codeine Sulfate (Codeine Sulfate)  Anticoagulation Management History:      The patient is taking warfarin and comes in today for a routine follow up visit.  Positive risk factors for bleeding include an age of 75 years or older and presence of serious comorbidities.  The bleeding index is 'intermediate risk'.  Positive CHADS2 values include History of HTN, Age > 65 years old, and History of Diabetes.  The start date was 08/19/2006.  His last INR was 5.3 ratio.  Anticoagulation responsible provider: Shirlee Latch MD, Zoe Nordin.  INR POC: 2.9.  Cuvette Lot#: [id number].  Exp: 08/2011.    Anticoagulation Management Assessment/Plan:      The patient's current anticoagulation dose is Warfarin sodium 5 mg tabs: Use as directed by Anticoagulation Clinic.  The target INR is 2.0-3.0.  The next INR is due 07/10/2010.  Anticoagulation instructions were given to patient.  Results were reviewed/authorized by [name], RN, BSN.  He was notified by Cloyde Reams RN.         Prior Anticoagulation Instructions: INR 1.9  Take two tablets today only, then continue taking one tablet every day except 1 1/2 tablets on Sunday and Wednesday.   Re-check INR on 06/19/10.     Current Anticoagulation Instructions: INR 2.9 Continue 5mg s everyday except 7.5mg s on Sundays and Wednesdays. Recheck in 3 weeks.

## 2010-11-04 NOTE — Assessment & Plan Note (Signed)
Review of gastrointestinal problems: 1. FOTB positive December 2007. This was while on Coumadin with very     elevated INR. Colonoscopy February 2008 was normal except for     internal hemorrhoids. 2. GERD related dyspeptic symptoms. EGD March 2008 mild nonspecific     gastritis, CLO negative. Symptoms improved on once daily PPI.     Epigastric discomfort increased October 2008.  Labs show mildly     elevated nonspecific white count 12,000, normal liver tests.  Right     upper quadrant ultrasound showed gallbladder sludge, but no     gallstones and normal bile duct. 3. Acute gangrenous cholecystitis, status post open cholecystectomy winter 2010/2011.  Complicated by postoperative bile leak, ERCP February 2010 found small CBD stone, sphincterotomy performed, Stone was removed, bile leak was noted from region of cystic duct stump, this was small.  \   History of Present Illness Visit Type: Follow-up Visit Primary GI MD: Rob Bunting MD Primary Provider: [name], MD Requesting Provider: n/a Chief Complaint: post hosp f/u  History of Present Illness:     Has been home for at least 3-4 weeks now.  The JP drain was removed last week by Dr. Dwain Sarna.  He is not feeling worse after that.  Eating better, no nausea, getting stronger.  says he feels the best now that he is felt since prior to Thanksgiving.            Current Medications (verified): 1)  Avodart 0.5 Mg Caps (Dutasteride) .... Take 1 Capsule Once A Day 2)  Glyburide Micronized 6 Mg Tabs (Glyburide Micronized) .Marland Kitchen.. 1 By Mouth Two Times A Day 3)  Levothyroxine Sodium 25 Mcg Tabs (Levothyroxine Sodium) .Marland Kitchen.. 1 By Mouth Daily 4)  Lipitor 80 Mg  Tabs (Atorvastatin Calcium) .... One Tab By Mouth At Night. 5)  Digoxin 0.125 Mg Tabs (Digoxin) .Marland Kitchen.. 1 Tab Once Daily 6)  Prilosec 20 Mg Cpdr (Omeprazole) .Marland Kitchen.. 1 Daily By Mouth 7)  Nitrostat 0.3 Mg  Subl (Nitroglycerin) .... As Needed 8)  Ferro-Bob 325 (65 Fe) Mg  Tabs  (Ferrous Sulfate) .Marland Kitchen.. 1 By Mouths A Day 9)  Accusure Insulin Syringe 31g X 5/16" 0.5 Ml  Misc (Insulin Syringe-Needle U-100) .... As Directed Two Times A Day 10)  Tylenol Extra Strength 500 Mg  Tabs (Acetaminophen) .... As Needed Pain Usually Helps Except At Night 11)  Carvedilol 25 Mg Tabs (Carvedilol) .Marland Kitchen.. 1 Tablet Twice A Day By Mouth 12)  Tramadol Hcl 50 Mg Tabs (Tramadol Hcl) .Marland Kitchen.. 1 Every 4 To 6 Hours As Needed Pain 13)  Pletal 100 Mg Tabs (Cilostazol) .... One Tab By Mouth Twice A Day 14)  Cyanocobalamin 1000 Mcg/ml Soln (Cyanocobalamin) .... Take Injection Every Other Week 15)  Nasonex 50 Mcg/act Susp (Mometasone Furoate) .... Once Daily 16)  Mucus Relief 400 Mg Tabs (Guaifenesin) .... As Needed 17)  Amlodipine Besylate 5 Mg Tabs (Amlodipine Besylate) .... Take One Tablet By Mouth Daily 18)  Novolog 100 Unit/ml Soln (Insulin Aspart) .... 35 Units Every Am, 35 Units Every Pm 19)  Oxycodone-Acetaminophen 5-500 Mg Caps (Oxycodone-Acetaminophen) .... As Needed  Allergies (verified): 1)  ! Codeine Sulfate (Codeine Sulfate)  Vital Signs:  Patient profile:   77 year old male Height:      69 inches Weight:      196 pounds BMI:     29.05 BSA:     2.05 Pulse rate:   96 / minute Pulse rhythm:   regular BP sitting:   98 /  60  (left arm) Cuff size:   regular  Vitals Entered By: Ok Anis CMA (December 17, 2009 10:15 AM)  Physical Exam  Additional Exam:  Constitutional: generally well appearing Psychiatric: alert and oriented times 3 Abdomen: soft, non-tender, non-distended, normal bowel sounds    Impression & Recommendations:  Problem # 1:  Complicated cholecystitis, bile duct leak, common bile duct stone clinically he is feeling much much better. His JP drain was removed last week by the surgeon in his office. He has not felt any worse since then and so I suspect that the bile leak has indeed sealed.  we will get a set of LFTs today and as long as these are normal or nearly normal  but I do not think we need to follow him anymore unless new symptoms arise.  Other Orders: TLB-Hepatic/Liver Function Pnl ([id number])  Patient Instructions: 1)  You will get lab test(s) done today (lfts). 2)  A copy of this information will be sent to Dr. Dwain Sarna. 3)  The medication list was reviewed and reconciled.  All changed / newly prescribed medications were explained.  A complete medication list was provided to the patient / caregiver.

## 2010-11-04 NOTE — Progress Notes (Signed)
Summary: SURGICAL CLEARANCE   Phone Note From Other Clinic   Caller: Nurse Summary of Call: PER DR WAKEFIELD PT NEEDS SURGICAL CLEARANCE FOR TOMORROW. IS PT CLEARED?? [facility] [id number] FAX [phone] Initial call taken by: Edman Circle,  October 22, 2009 11:39 AM  Follow-up for Phone Call        Short Stay has also called to get a copy of surgical clearance (fax [phone]). Julieta Gutting, RN, BSN  October 22, 2009 11:46 AM   Additional Follow-up for Phone Call Additional follow up Details #1::        Office note done. Additional Follow-up by: Norva Karvonen, MD,  October 22, 2009 3:12 PM    Additional Follow-up for Phone Call Additional follow up Details #2::    I confirmed that Dr Doreen Salvage office did receive fax.  Note also faxed to Short Stay. Follow-up by: Julieta Gutting, RN, BSN,  October 22, 2009 3:55 PM

## 2010-11-04 NOTE — Medication Information (Signed)
Summary: rov/nb  Anticoagulant Therapy  Managed by: [name], RN, BSN Referring MD: Charlies Constable MD PCP: [name], MD Supervising MD: Excell Seltzer MD, Casimiro Needle Indication 1: Atrial Fibrillation (ICD-427.31) Lab Used: [facility] [place] Site: [address] INR POC 4.0 INR RANGE 2 - 3  Dietary changes: no    Health status changes: no    Bleeding/hemorrhagic complications: no    Recent/future hospitalizations: no    Any changes in medication regimen? no    Recent/future dental: no  Any missed doses?: no       Is patient compliant with meds? yes       Allergies: 1)  ! Codeine Sulfate (Codeine Sulfate)  Anticoagulation Management History:      The patient is taking warfarin and comes in today for a routine follow up visit.  Positive risk factors for bleeding include an age of 75 years or older and presence of serious comorbidities.  The bleeding index is 'intermediate risk'.  Positive CHADS2 values include History of HTN, Age > 51 years old, and History of Diabetes.  The start date was 08/19/2006.  His last INR was 5.3 ratio.  Anticoagulation responsible provider: Excell Seltzer MD, Casimiro Needle.  INR POC: 4.0.  Cuvette Lot#: [id number].  Exp: 09/2011.    Anticoagulation Management Assessment/Plan:      The patient's current anticoagulation dose is Warfarin sodium 5 mg tabs: Use as directed by Anticoagulation Clinic.  The target INR is 2.0-3.0.  The next INR is due 09/05/2010.  Anticoagulation instructions were given to patient.  Results were reviewed/authorized by [name], RN, BSN.  He was notified by [name], RN, BSN.         Prior Anticoagulation Instructions: INR 3.3  Hold today's dose then resume 1 tablet except 1.5 tablets on Sunday and Wednesday.  Recheck INR in 3 weeks.  Current Anticoagulation Instructions: INR 4.0 Skip today's dose then Change dose to 1 pill everyday except 1 1/2 pill on Sundays. Recheck in 10 days.

## 2010-11-04 NOTE — Letter (Signed)
Summary: [facility]   Imported By: Sherian Rein 11/14/2009 14:00:17  _____________________________________________________________________  External Attachment:    Type:   Image     Comment:   External Document

## 2010-11-04 NOTE — Progress Notes (Signed)
   Phone Note Other Incoming   Caller: Boneta Lucks Details for Reason: Pt.Information Initial call taken by: Marijean Niemann Stress over to [facility] to fax [phone] [facility]  October 22, 2009 11:50 AM

## 2010-11-04 NOTE — Progress Notes (Signed)
Summary: form for diabetic supplies  Phone Note From Pharmacy   Caller: [facility] Summary of Call: Form for diabetic supplies is on your desk.  I checked with pt and he does want these supplies. Initial call taken by: Lowella Petties CMA,  July 10, 2010 4:37 PM  Follow-up for Phone Call        signed, in my outbox.  Follow-up by: Crawford Givens MD,  July 10, 2010 5:09 PM  Additional Follow-up for Phone Call Additional follow up Details #1::        Faxed. Additional Follow-up by: Delilah Shan CMA Duncan Dull),  July 10, 2010 5:41 PM

## 2010-11-04 NOTE — Letter (Signed)
Summary: Pt's Blood Sugar Readings from 05/29/10-06/13/10  Pt's Blood Sugar Readings from 05/29/10-06/13/10   Imported By: Beau Fanny 06/17/2010 08:22:42  _____________________________________________________________________  External Attachment:    Type:   Image     Comment:   External Document  Appended Document: Pt's Blood Sugar Readings from 05/29/10-06/13/10 have patient increase levemir to 12 units a day and continue sliding scale as before.  Do this for 1 week.  If fasting AM sugars are consistently above 140 after that, increase to 14 units a day and then send the numbers back in (after another week).  Thanks.   Appended Document: Pt's Blood Sugar Readings from 05/29/10-06/13/10 Patient Advised. He repeated the instructions back to me.

## 2010-11-04 NOTE — Medication Information (Signed)
Summary: rov/tm  Anticoagulant Therapy  Managed by: Weston Brass, PharmD Referring MD: Charlies Constable MD PCP: [name], MD Supervising MD: Jens Som MD, Arlys John Indication 1: Atrial Fibrillation (ICD-427.31) Lab Used: [facility] [place] Site: [address] INR POC 1.3 INR RANGE 2 - 3  Dietary changes: no    Health status changes: no    Bleeding/hemorrhagic complications: no    Recent/future hospitalizations: no    Any changes in medication regimen? no    Recent/future dental: no  Any missed doses?: yes     Details: missed 1 dose on Saturday  Is patient compliant with meds? yes       Allergies: 1)  ! Codeine Sulfate (Codeine Sulfate)  Anticoagulation Management History:      The patient is taking warfarin and comes in today for a routine follow up visit.  Positive risk factors for bleeding include an age of 75 years or older and presence of serious comorbidities.  The bleeding index is 'intermediate risk'.  Positive CHADS2 values include History of HTN, Age > 66 years old, and History of Diabetes.  The start date was 08/19/2006.  His last INR was 5.3 ratio.  Anticoagulation responsible provider: Jens Som MD, Arlys John.  INR POC: 1.3.  Cuvette Lot#: [id number].  Exp: 05/2011.    Anticoagulation Management Assessment/Plan:      The patient's current anticoagulation dose is Warfarin sodium 5 mg tabs: Use as directed by Anticoagulation Clinic.  The target INR is 2.0-3.0.  The next INR is due 04/14/2010.  Anticoagulation instructions were given to patient.  Results were reviewed/authorized by Weston Brass, PharmD.  He was notified by Weston Brass PharmD.         Prior Anticoagulation Instructions: INR 1.2 Today take 1 1/2  pills and on Wednesday take 1 pill then resume 1 pill everyday except 1/2 pill on Mondays, Wednesdays and Fridays. Recheck in 2 weeks.   Current Anticoagulation Instructions: INR 1.3  Take 1 tablet today then increase dose to 1 tablet every day except  1/2 tablet on Monday and Thursday.

## 2010-11-04 NOTE — Medication Information (Signed)
Summary: [address]  Anticoagulant Therapy  Managed by: [name], RN, BSN Referring MD: Charlies Constable MD PCP: [name], MD Supervising MD: Jens Som MD, Arlys John Indication 1: Atrial Fibrillation (ICD-427.31) Lab Used: [facility] [place] Site: [address] INR POC 2.2 INR RANGE 2 - 3  Dietary changes: yes       Details: Appetite sl. poor  Health status changes: no    Bleeding/hemorrhagic complications: no    Recent/future hospitalizations: no    Any changes in medication regimen? yes       Details: Finishes Cipro on tomorrow morning  Recent/future dental: no  Any missed doses?: no       Is patient compliant with meds? yes      Comments: Saw Dr. Dwain Sarna this morning and he left drain in place for another 2 wks., return visit 12/12/09.   Allergies: 1)  ! Codeine Sulfate (Codeine Sulfate)  Anticoagulation Management History:      The patient is taking warfarin and comes in today for a routine follow up visit.  Positive risk factors for bleeding include an age of 75 years or older and presence of serious comorbidities.  The bleeding index is 'intermediate risk'.  Positive CHADS2 values include History of HTN, Age > 65 years old, and History of Diabetes.  The start date was 08/19/2006.  His last INR was 5.3 ratio.  Anticoagulation responsible provider: Jens Som MD, Arlys John.  INR POC: 2.2.  Cuvette Lot#: [id number].  Exp: 01/2011.    Anticoagulation Management Assessment/Plan:      The target INR is 2.0-3.0.  The next INR is due 12/06/2009.  Anticoagulation instructions were given to patient.  Results were reviewed/authorized by [name], RN, BSN.  He was notified by [name], RN, BSN.         Prior Anticoagulation Instructions: INR 1.8  Spoke with Efraim Kaufmann, RN Genevieve Norlander while at pt's home.  Advised to have pt take 1 tablet today then resume same dosage 1 tablet daily except 1/2 tablet on MWF.  Recheck in 1 week.    Current Anticoagulation  Instructions: INR 2.2 1 pill everyday except 1/2 pill on Mondays, Wednesday, and Fridays. Recheck in one week.

## 2010-11-04 NOTE — Procedures (Signed)
Summary: ERCP  Patient: Justin Lane Note: All result statuses are Final unless otherwise noted.  Tests: (1) ERCP (ERC)   ERC ERCP                  DONE     [facility]     [address]     [place], [place]  [address]           ERCP PROCEDURE REPORT           PATIENT:  Justin, Lane  MR#:  [id number]     BIRTHDATE:  August 13, 1930  GENDER:  male     ENDOSCOPIST:  Rachael Fee, MD     PROCEDURE DATE:  11/15/2009     PROCEDURE:  ERCP with removal of stones, ERCP with sphincterotomy     INDICATIONS:  abnormal imaging open cholecystectomy about one     month ago; necrotic gallbladder, drain left in place; recent HIDA     showed bile duct leak in region of GB fossa, normal LFTs, drain     output somewhat purulent appearing     MEDICATIONS:  Fentanyl 100 mcg IV, Versed 6 mg IV, on Zozyn in     hospital     TOPICAL ANESTHETIC:  none           DESCRIPTION OF PROCEDURE:   After the risks benefits and     alternatives of the procedure were thoroughly explained, informed     consent was obtained.  Scout film showed drain in RUQ.  The     [id number] ([id number]) endoscope was introduced through the mouth and     advanced to the second portion of the duodenum without detailed     examination of the UGI tract.  A 44 Autotome over a .035 hydrawire     was used to cannulate the bile duct and contrast was injected.     Cholangiogram showed small bile duct leak from cystic duct     stump/remnant.  The extravasated dye pooled immediately adjacent     to the JP drain tip.  The biliary tree was not dilated.  There was     a 5-40mm mobile filling defect in the distal CBD consistent with a     stone and so a biliary sphincterotomy was performed and the CBD     was swept with biliary balloon.  A single round, black stone was     delivered into the duodenum.  There was no purulence or bleeding.     The main pancreatic duct was cannulate with wire but never injected     with dye.       <<PROCEDUREIMAGES>>           ENDOSCOPIC IMPRESSION:     Small bile leak from cystic duct remnant. 5mm distal CBD stone.     Biliary sphincterotomy and stone removal performed. Sphincterotomy     allowed for stone removal and  should also help the bile leak     resolve. I discussed these findings with Dr. Dwain Sarna and we     agree to observe clinically for now, continue on IV ABX for now as     well.           ______________________________     Rachael Fee, MD           cc: Harden Mo, MD           n.  eSIGNED:   Rachael Fee at 11/15/2009 12:24 PM           Donald Pore, [id number]  Note: An exclamation mark (!) indicates a result that was not dispersed into the flowsheet. Document Creation Date: 11/15/2009 12:25 PM _______________________________________________________________________  (1) Order result status: Final Collection or observation date-time: 11/15/2009 12:09 Requested date-time:  Receipt date-time:  Reported date-time:  Referring Physician:   Ordering Physician: Rob Bunting [phone]) Specimen Source:  Source: Launa Grill Order Number: [id number] Lab site:

## 2010-11-04 NOTE — Progress Notes (Signed)
Summary: form for diabetic supplies  Phone Note From Pharmacy   Caller: [facility] Summary of Call: Form for diabetic supplies is on your shelf. The last company he used has gone out of business so he had to change. Initial call taken by: Lowella Petties CMA,  March 05, 2010 10:53 AM  Follow-up for Phone Call        Thanks. Form completed. Follow-up by: Shaune Leeks MD,  March 05, 2010 1:37 PM  Additional Follow-up for Phone Call Additional follow up Details #1::        Form faxed, placed up front to be scanned. Additional Follow-up by: Lowella Petties CMA,  March 05, 2010 3:09 PM

## 2010-11-04 NOTE — Medication Information (Signed)
Summary: [facility]Diabetes Testing Supplies Order  [facility]Diabetes Testing Supplies Order   Imported By: Beau Fanny 12/11/2009 11:44:31  _____________________________________________________________________  External Attachment:    Type:   Image     Comment:   External Document

## 2010-11-04 NOTE — Letter (Signed)
Summary: [facility]   Imported By: Lanelle Bal 12/11/2009 08:07:26  _____________________________________________________________________  External Attachment:    Type:   Image     Comment:   External Document

## 2010-11-04 NOTE — Progress Notes (Signed)
Summary: message on [address]  ---- Converted from flag ---- ---- 10/07/2009 2:32 PM, Linde Gillis CMA Duncan Dull) wrote:   ---- 10/07/2009 2:19 PM, [name], RN, BSN wrote: Pt seen in coumadin clinic today. States he is no longer taking his Iron tablets or Prilosec and want to know if he should still be on these meds. Please call pt.at [phone] after confering with Dr. Hetty Ely. Thank you.  Due to Abd pain recently, he should be on Prilosec, one tab by mouth 45 mins before brfst. He should also be on low dose iron therapy due to prior anemia. One iron pill a day. Shaune Leeks MD  October 08, 2009 8:01 AM  ------------------------------     Follow-up for Phone Call       Follow-up by: Judie Grieve,  October 09, 2009 10:03 AM    Additional Follow-up for Phone Call Additional follow up Details #2::    Attempted to reach pt. LMOM at his # and son # with Dr. Lorenza Chick instructions. Instructed Pt. and son to call office back to inform us they received this message.  Follow-up by: [name], RN, BSN,  October 08, 2009 4:41 PM  Additional Follow-up for Phone Call Additional follow up Details #3:: Details for Additional Follow-up Action Taken: Pt. was admitted to [facility]  pending Cholecystectomy with Dr. Dwain Sarna. Awaiting Cardiac Clearance.  Additional Follow-up by: [name], RN, BSN,  October 09, 2009 4:33 PM

## 2010-11-04 NOTE — Miscellaneous (Signed)
  Clinical Lists Changes  Medications: Changed medication from LEVEMIR 100 UNIT/ML SOLN (INSULIN DETEMIR) 8 units subcut daily to LEVEMIR 100 UNIT/ML SOLN (INSULIN DETEMIR) 10 units subcut daily     Prior Medications: AVODART 0.5 MG CAPS (DUTASTERIDE) Take 1 capsule once a day GLYBURIDE MICRONIZED 6 MG TABS (GLYBURIDE MICRONIZED) 1 by mouth two times a day LEVOTHYROXINE SODIUM 25 MCG TABS (LEVOTHYROXINE SODIUM) 1 by mouth daily LIPITOR 80 MG  TABS (ATORVASTATIN CALCIUM) one tab by mouth at night. DIGOXIN 0.125 MG TABS (DIGOXIN) 1 tab once daily NITROSTAT 0.3 MG  SUBL (NITROGLYCERIN) as needed FERRO-BOB 325 (65 FE) MG  TABS (FERROUS SULFATE) 1 by mouths a day BD INSULIN SYRINGE ULTRAFINE 31G X 5/16" 0.5 ML MISC (INSULIN SYRINGE-NEEDLE U-100) use twice a day TYLENOL EXTRA STRENGTH 500 MG  TABS (ACETAMINOPHEN) as needed PAIN USUALLY HELPS EXCEPT AT NIGHT CARVEDILOL 25 MG TABS (CARVEDILOL) 1 tablet twice a day by mouth TRAMADOL HCL 50 MG TABS (TRAMADOL HCL) 1 every 4 to 6 hours as needed pain PLETAL 100 MG TABS (CILOSTAZOL) one tab by mouth twice a day VITAMIN B-12 1000 MCG TABS (CYANOCOBALAMIN) Take  1 tablet daily NASONEX 50 MCG/ACT SUSP (MOMETASONE FUROATE) once daily MUCUS RELIEF 400 MG TABS (GUAIFENESIN) as needed AMLODIPINE BESYLATE 5 MG TABS (AMLODIPINE BESYLATE) Take one tablet by mouth daily NOVOLOG 100 UNIT/ML SOLN (INSULIN ASPART) 35 units every AM, 35 units every PM OXYCODONE-ACETAMINOPHEN 5-500 MG CAPS (OXYCODONE-ACETAMINOPHEN) as needed WARFARIN SODIUM 5 MG TABS (WARFARIN SODIUM) Use as directed by Anticoagulation Clinic SINGULAIR 10 MG TABS (MONTELUKAST SODIUM) Take 1 tablet everyday Current Allergies: ! CODEINE SULFATE (CODEINE SULFATE)

## 2010-11-04 NOTE — Assessment & Plan Note (Signed)
Summary: SORE TOE   Vital Signs:  Patient profile:   77 year old male Height:      69 inches Weight:      191.25 pounds BMI:     28.34 Temp:     97.5 degrees F oral Pulse rate:   84 / minute Pulse rhythm:   regular BP sitting:   142 / 52  (left arm) Cuff size:   regular  Vitals Entered By: Delilah Shan CMA Duncan Dull) (November 04, 2009 10:03 AM) CC: Sore toe   History of Present Illness: 77 yo with 2 weeks of left great toe redness, swelling, and pain. Was in hospital recenlty to have his gall bladder removed, toe has been hurting, getting more red and swollen since he was discharged from the hospital. No fevers or chills. No known trauma to his toe. Always has some nausea, no vomiting, no diarrhea.  Does have history of gout in past, cannot remember if this felt the same. Did have gout in his great toe in past.  Current Medications (verified): 1)  Avodart 0.5 Mg Caps (Dutasteride) .... Take 1 Capsule Once A Day 2)  Glyburide Micronized 6 Mg Tabs (Glyburide Micronized) .Marland Kitchen.. 1 By Mouth Two Times A Day 3)  Levothyroxine Sodium 25 Mcg Tabs (Levothyroxine Sodium) .Marland Kitchen.. 1 By Mouth Daily 4)  Lipitor 80 Mg  Tabs (Atorvastatin Calcium) .... One Tab By Mouth At Night. 5)  Digoxin 0.125 Mg Tabs (Digoxin) .Marland Kitchen.. 1 Tab Once Daily 6)  Prilosec 20 Mg Cpdr (Omeprazole) .Marland Kitchen.. 1 Daily By Mouth 7)  Nitrostat 0.3 Mg  Subl (Nitroglycerin) .... As Needed 8)  Ferro-Bob 325 (65 Fe) Mg  Tabs (Ferrous Sulfate) .Marland Kitchen.. 1 By Mouths A Day 9)  Bayer Low Strength 81 Mg  Tbec (Aspirin) .Marland Kitchen.. 1 Daily By Mouth On Hold 10)  Accusure Insulin Syringe 31g X 5/16" 0.5 Ml  Misc (Insulin Syringe-Needle U-100) .... As Directed Two Times A Day 11)  Tylenol Extra Strength 500 Mg  Tabs (Acetaminophen) .... As Needed Pain Usually Helps Except At Night 12)  Carvedilol 25 Mg Tabs (Carvedilol) .Marland Kitchen.. 1 Tablet Twice A Day By Mouth 13)  Tramadol Hcl 50 Mg Tabs (Tramadol Hcl) .Marland Kitchen.. 1 Every 4 To 6 Hours As Needed Pain 14)  Pletal 100 Mg  Tabs (Cilostazol) .... One Tab By Mouth Twice A Day 15)  Coumadin 5 Mg Tabs (Warfarin Sodium) .... Take As Directed By Coumadin Clinic. On Hold 16)  Cyanocobalamin 1000 Mcg/ml Soln (Cyanocobalamin) .... Take Injection Every Other Week 17)  Nasonex 50 Mcg/act Susp (Mometasone Furoate) .... Once Daily 18)  Mucus Relief 400 Mg Tabs (Guaifenesin) .... As Needed 19)  Amlodipine Besylate 5 Mg Tabs (Amlodipine Besylate) .... Take One Tablet By Mouth Daily 20)  Novolog 100 Unit/ml Soln (Insulin Aspart) .... 35 Units Every Am, 35 Units Every Pm 21)  Oxycodone-Acetaminophen 5-500 Mg Caps (Oxycodone-Acetaminophen) .... As Needed  Allergies: 1)  ! Codeine Sulfate (Codeine Sulfate)  Review of Systems      See HPI General:  Denies chills and fever. CV:  Denies chest pain or discomfort. GI:  Complains of nausea; denies diarrhea and vomiting. MS:  Complains of joint pain, joint redness, and joint swelling. Derm:  Denies rash.  Physical Exam  General:  Well-developed,well-nourishedalert,appropriate and cooperative throughout examination, obese. Mouth:  MMM Lungs:  Normal respiratory effort, chest expands symmetrically. Lungs are clear to auscultation, no crackles or wheezes. Heart:  Normal rate and regular rhythm. S1 and S2 normal without gallop, murmur,  click, rub or other extra sounds.   Foot/Ankle Exam  Foot Exam:    Right:    Inspection:  Normal    Palpation:  Normal    Left:    Inspection:  Abnormal       Location:  forefoot    Left MTP red, very tender to palpation, warm to touch, no obvious effusion.   Impression & Recommendations:  Problem # 1:  TOE PAIN (ICD-729.5) Assessment New Seems most consistent with gout flare.  Given multiple medical problems, will check uric acid, along with BMET and LFTs prior to treating presumptively.  Pt in agreement with plan.  Non toxic appearing. Orders: Venipuncture (24401) TLB-Uric Acid, Blood (84550-URIC) TLB-BMP (Basic Metabolic Panel-BMET)  (80048-METABOL) TLB-Hepatic/Liver Function Pnl (80076-HEPATIC) Vit B12 1000 mcg (J3420) Admin of Therapeutic Inj  intramuscular or subcutaneous (02725)  Complete Medication List: 1)  Avodart 0.5 Mg Caps (Dutasteride) .... Take 1 capsule once a day 2)  Glyburide Micronized 6 Mg Tabs (Glyburide micronized) .Marland Kitchen.. 1 by mouth two times a day 3)  Levothyroxine Sodium 25 Mcg Tabs (Levothyroxine sodium) .Marland Kitchen.. 1 by mouth daily 4)  Lipitor 80 Mg Tabs (Atorvastatin calcium) .... One tab by mouth at night. 5)  Digoxin 0.125 Mg Tabs (Digoxin) .Marland Kitchen.. 1 tab once daily 6)  Prilosec 20 Mg Cpdr (Omeprazole) .Marland Kitchen.. 1 daily by mouth 7)  Nitrostat 0.3 Mg Subl (Nitroglycerin) .... As needed 8)  Ferro-bob 325 (65 Fe) Mg Tabs (Ferrous sulfate) .Marland Kitchen.. 1 by mouths a day 9)  Bayer Low Strength 81 Mg Tbec (Aspirin) .Marland Kitchen.. 1 daily by mouth on hold 10)  Accusure Insulin Syringe 31g X 5/16" 0.5 Ml Misc (Insulin syringe-needle u-100) .... As directed two times a day 11)  Tylenol Extra Strength 500 Mg Tabs (Acetaminophen) .... As needed pain usually helps except at night 12)  Carvedilol 25 Mg Tabs (Carvedilol) .Marland Kitchen.. 1 tablet twice a day by mouth 13)  Tramadol Hcl 50 Mg Tabs (Tramadol hcl) .Marland Kitchen.. 1 every 4 to 6 hours as needed pain 14)  Pletal 100 Mg Tabs (Cilostazol) .... One tab by mouth twice a day 15)  Coumadin 5 Mg Tabs (Warfarin sodium) .... Take as directed by coumadin clinic. on hold 16)  Cyanocobalamin 1000 Mcg/ml Soln (Cyanocobalamin) .... Take injection every other week 17)  Nasonex 50 Mcg/act Susp (Mometasone furoate) .... Once daily 18)  Mucus Relief 400 Mg Tabs (Guaifenesin) .... As needed 19)  Amlodipine Besylate 5 Mg Tabs (Amlodipine besylate) .... Take one tablet by mouth daily 20)  Novolog 100 Unit/ml Soln (Insulin aspart) .... 35 units every am, 35 units every pm 21)  Oxycodone-acetaminophen 5-500 Mg Caps (Oxycodone-acetaminophen) .... As needed  Current Allergies (reviewed today): ! CODEINE SULFATE (CODEINE  SULFATE)   Medication Administration  Injection # 1:    Medication: Vit B12 1000 mcg    Diagnosis: TOE PAIN (ICD-729.5)    Route: IM    Site: L deltoid    Exp Date: 02/03/2011    Lot #: [id number]    Mfr: [employer]ent    Patient tolerated injection without complications    Given by: Benny Lennert CMA (AAMA) (November 04, 2009 10:27 AM)  Orders Added: 1)  Venipuncture [36644] 2)  TLB-Uric Acid, Blood [84550-URIC] 3)  TLB-BMP (Basic Metabolic Panel-BMET) [80048-METABOL] 4)  TLB-Hepatic/Liver Function Pnl [80076-HEPATIC] 5)  Vit B12 1000 mcg [J3420] 6)  Admin of Therapeutic Inj  intramuscular or subcutaneous [96372] 7)  Est. Patient Level IV [03474]

## 2010-11-04 NOTE — Medication Information (Signed)
Summary: rov/tm  Anticoagulant Therapy  Managed by: [name], RN, BSN Referring MD: Charlies Constable MD PCP: [name], MD Supervising MD: Riley Kill MD, Maisie Fus Indication 1: Atrial Fibrillation (ICD-427.31) Lab Used: [facility] [place] Site: [address] INR POC 1.4 INR RANGE 2 - 3  Dietary changes: no    Health status changes: no    Bleeding/hemorrhagic complications: no    Recent/future hospitalizations: no    Any changes in medication regimen? no    Recent/future dental: no  Any missed doses?: no       Is patient compliant with meds? yes       Allergies: 1)  ! Codeine Sulfate (Codeine Sulfate)  Anticoagulation Management History:      The patient is taking warfarin and comes in today for a routine follow up visit.  Positive risk factors for bleeding include an age of 19 years or older and presence of serious comorbidities.  The bleeding index is 'intermediate risk'.  Positive CHADS2 values include History of HTN, Age > 13 years old, and History of Diabetes.  The start date was 08/19/2006.  His last INR was 5.3 ratio.  Anticoagulation responsible provider: Riley Kill MD, Maisie Fus.  INR POC: 1.4.  Cuvette Lot#: [id number].  Exp: 07/2011.    Anticoagulation Management Assessment/Plan:      The patient's current anticoagulation dose is Warfarin sodium 5 mg tabs: Use as directed by Anticoagulation Clinic.  The target INR is 2.0-3.0.  The next INR is due 05/08/2010.  Anticoagulation instructions were given to patient.  Results were reviewed/authorized by [name], RN, BSN.  He was notified by [name], RN, BSN.         Prior Anticoagulation Instructions: INR 1.3 Today take 1.5 tablets then change dose to 1 tablet everyday. Recheck in 10 days.   Current Anticoagulation Instructions: INR 1.4 Today take 1 1/2 pills, then change dose to 1 pill everyday except 1 1/2 pills on Wednesdays.  Recheck in 10 days.

## 2010-11-04 NOTE — Medication Information (Signed)
Summary: rov/tm  Anticoagulant Therapy  Managed by: Weston Brass, PharmD Referring MD: Charlies Constable MD PCP: [name], MD Supervising MD: Excell Seltzer MD, Casimiro Needle Indication 1: Atrial Fibrillation (ICD-427.31) Lab Used: [facility] [place] Site: [address] INR POC 1.7 INR RANGE 2 - 3  Dietary changes: yes       Details: increased green vegetables   Health status changes: no    Bleeding/hemorrhagic complications: no    Recent/future hospitalizations: no    Any changes in medication regimen? no    Recent/future dental: no  Any missed doses?: no       Is patient compliant with meds? yes       Allergies: 1)  ! Codeine Sulfate (Codeine Sulfate)  Anticoagulation Management History:      The patient is taking warfarin and comes in today for a routine follow up visit.  Positive risk factors for bleeding include an age of 75 years or older and presence of serious comorbidities.  The bleeding index is 'intermediate risk'.  Positive CHADS2 values include History of HTN, Age > 75 years old, and History of Diabetes.  The start date was 08/19/2006.  His last INR was 5.3 ratio.  Anticoagulation responsible provider: Excell Seltzer MD, Casimiro Needle.  INR POC: 1.7.  Exp: 09/2011.    Anticoagulation Management Assessment/Plan:      The patient's current anticoagulation dose is Warfarin sodium 5 mg tabs: Use as directed by Anticoagulation Clinic.  The target INR is 2.0-3.0.  The next INR is due 09/19/2010.  Anticoagulation instructions were given to patient.  Results were reviewed/authorized by Weston Brass, PharmD.  He was notified by Weston Brass PharmD.         Prior Anticoagulation Instructions: INR 4.0 Skip today's dose then Change dose to 1 pill everyday except 1 1/2 pill on Sundays. Recheck in 10 days.   Current Anticoagulation Instructions: INR 1.7  Take 1 1/2 tablets today then resume same dose of 1 tablet every day except 1 1/2 tablets on Sunday.  Recheck INR in 2 weeks.

## 2010-11-04 NOTE — Medication Information (Signed)
Summary: rov/tm  Anticoagulant Therapy  Managed by: Weston Brass, PharmD Referring MD: Charlies Constable MD PCP: [name], MD Supervising MD: Johney Frame MD, Fayrene Fearing Indication 1: Atrial Fibrillation (ICD-427.31) Lab Used: [facility] [place] Site: [address] INR POC 3.0 INR RANGE 2 - 3  Dietary changes: no    Health status changes: no    Bleeding/hemorrhagic complications: no    Recent/future hospitalizations: no    Any changes in medication regimen? yes       Details: currently on amoxicillin ( 4 days left); aricept  Recent/future dental: no  Any missed doses?: yes     Details: two weeks ago, missed one dose  Is patient compliant with meds? yes       Allergies: 1)  ! Codeine Sulfate (Codeine Sulfate)  Anticoagulation Management History:      The patient is taking warfarin and comes in today for a routine follow up visit.  Positive risk factors for bleeding include an age of 75 years or older and presence of serious comorbidities.  The bleeding index is 'intermediate risk'.  Positive CHADS2 values include History of HTN, Age > 21 years old, and History of Diabetes.  The start date was 08/19/2006.  His last INR was 5.3 ratio.  Anticoagulation responsible provider: Deklyn Gibbon MD, Fayrene Fearing.  INR POC: 3.0.  Cuvette Lot#: [id number].  Exp: 08/2011.    Anticoagulation Management Assessment/Plan:      The patient's current anticoagulation dose is Warfarin sodium 5 mg tabs: Use as directed by Anticoagulation Clinic.  The target INR is 2.0-3.0.  The next INR is due 08/07/2010.  Anticoagulation instructions were given to patient.  Results were reviewed/authorized by Weston Brass, PharmD.  He was notified by Duncan Dull.         Prior Anticoagulation Instructions: INR 2.9 Continue 5mg s everyday except 7.5mg s on Sundays and Wednesdays. Recheck in 3 weeks.   Current Anticoagulation Instructions: INR 3.0  Continue taking same dose of 1 tablet everyday except 1 1/2 tablet on Sunday  and Wednesday. Recheck in 4 weeks.

## 2010-11-04 NOTE — Progress Notes (Signed)
Summary: Dr Dwain Sarna   Phone Note Other Incoming   Caller: Dr Dwain Sarna Summary of Call: Dr Dwain Sarna called to schedule appt for pt with Dr Christella Hartigan, he was informed that Dr Christella Hartigan was not inthe office this week.  I offered appt with Amy and he was not happy with the pt seeing anyone other than a Dr.  I explained our policy regarding pt's seeing another provider other than thier own.  He was insistent that the pt be seen by Dr Christella Hartigan or Dr Arlyce Dice on 11/14/09.  I asked [facility] if I could put the pt on Dr Nita Sells schedule and she spoke with Dr Russella Dar about the policy and he stated that if Dr Dwain Sarna did not want the pt to see Amy he could page Dr Christella Hartigan and discuss with him.  I explained this to Dr Dwain Sarna and he accepted the appt with Amy and was given Dr Christella Hartigan pager number to call him directly. Initial call taken by: Chales Abrahams CMA (AAMA),  November 14, 2009 8:12 AM

## 2010-11-04 NOTE — Letter (Signed)
Summary: Letter with Patient Concerns  Letter with Patient Concerns   Imported By: Lanelle Bal 05/19/2010 09:51:46  _____________________________________________________________________  External Attachment:    Type:   Image     Comment:   External Document

## 2010-11-04 NOTE — Medication Information (Signed)
Summary: Coumadin Clinic  Anticoagulant Therapy  Managed by: Cloyde Reams, RN, BSN Referring Lane: Justin Lane PCP: Justin Quint, Lane Supervising Lane: Justin Lane, Justin Lane Indication 1: Atrial Fibrillation (ICD-427.31) Lab Used: [facility] [place] Site: [address] PT 13.2 INR POC 1.3 INR RANGE 2 - 3  Dietary changes: no    Health status changes: no    Bleeding/hemorrhagic complications: no    Recent/future hospitalizations: no    Any changes in medication regimen? no    Recent/future dental: no  Any missed doses?: yes     Details: Off coumadin for gallbladder surgery.  Started back on 10/30/09.  Is patient compliant with meds? yes       Allergies (verified): 1)  ! Codeine Sulfate (Codeine Sulfate)  Anticoagulation Management History:      His anticoagulation is being managed by telephone today.  Positive risk factors for bleeding include an age of 75 years or older and presence of serious comorbidities.  The bleeding index is 'intermediate risk'.  Positive CHADS2 values include History of HTN, Age > 58 years old, and History of Diabetes.  The start date was 08/19/2006.  His last INR was 5.3 ratio.  Prothrombin time is 13.2.  Anticoagulation responsible provider: Reza Crymes Lane, Justin Lane.  INR POC: 1.3.  Exp: 11/2010.    Anticoagulation Management Assessment/Plan:      The patient's current anticoagulation dose is Coumadin 5 mg tabs: Take as directed by coumadin clinic. ON HOLD.  The target INR is 2.0-3.0.  The next INR is due 11/08/2009.  Anticoagulation instructions were given to patient.  Results were reviewed/authorized by Cloyde Reams, RN, BSN.  He was notified by Cloyde Reams RN.         Prior Anticoagulation Instructions: INR 2  Take 1 tab = 5mg  today  -  This is your last dose prior to surgery.  Call for follow up Coumadin check when you leave the hospital after surgery. [phone]  Current Anticoagulation Instructions: INR 1.3  Called  spoke with pt.  Instructed pt to take 1 tablet today then resume same dosage 1 tablet daily except 1/2 tablet on Mondays, Wednesdays, and Fridays.  Recheck in 1 week.  Called Melissa with [facility] advised to recheck in 1 week.

## 2010-11-04 NOTE — Consult Note (Signed)
Summary: [facility]  [facility]   Imported By: Lanelle Bal 07/15/2010 14:06:13  _____________________________________________________________________  External Attachment:    Type:   Image     Comment:   External Document

## 2010-11-04 NOTE — Letter (Signed)
Summary: Justin Lane letter  [facility]  [address] [place], [place] [address]   Phone: [phone]  Fax: [phone]       05/12/2010 MRN: [id number]  Justin [address] [place], [place]  [address]  Dear Mr. [facility] [place], and [place] announce the retirement of Arta Silence, M.D., from full-time practice at the [facility] office effective April 03, 2010 and his plans of returning part-time.  It is important to Dr. Hetty Ely and to our practice that you understand that [facility] has seven physicians in our office for your health care needs.  We will continue to offer the same exceptional care that you have today.    Dr. Hetty Ely has spoken to many of you about his plans for retirement and returning part-time in the fall.   We will continue to work with you through the transition to schedule appointments for you in the office and meet the high standards that [facility] is committed to.   Again, it is with great pleasure that we share the news that Dr. Hetty Ely will return to [facility] at [facility] in October of 2011 with a reduced schedule.    If you have any questions, or would like to request an appointment with one of our physicians, please call us at [phone] and press the option for Scheduling an appointment.  We take pleasure in providing you with excellent patient care and look forward to seeing you at your next office visit.  Our [facility] Physicians are:  Tillman Abide, M.D. [name], M.D. Roxy Manns, M.D. Kerby [name], M.D. Hannah Beat, M.D. Ruthe Mannan, M.D. We proudly welcomed Raechel Ache, M.D. and Eustaquio Boyden, M.D. to the practice in July/August 2011.  Sincerely,  [facility]

## 2010-11-04 NOTE — Miscellaneous (Signed)
Summary: insulin syringes  Clinical Lists Changes  Medications: Changed medication from ACCUSURE INSULIN SYRINGE 31G X 5/16" 0.5 ML  MISC (INSULIN SYRINGE-NEEDLE U-100) as directed two times a day to BD INSULIN SYRINGE ULTRAFINE 31G X 5/16" 0.5 ML MISC (INSULIN SYRINGE-NEEDLE U-100) use twice a day - Signed Rx of BD INSULIN SYRINGE ULTRAFINE 31G X 5/16" 0.5 ML MISC (INSULIN SYRINGE-NEEDLE U-100) use twice a day;  #100 x 11;  Signed;  Entered by: Lowella Petties CMA;  Authorized by: Shaune Leeks MD;  Method used: Electronically to [facility], [place], [place]  [id number], Ph: [phone], Fax: [phone]    Prescriptions: BD INSULIN SYRINGE ULTRAFINE 31G X 5/16" 0.5 ML MISC (INSULIN SYRINGE-NEEDLE U-100) use twice a day  #100 x 11   Entered by:   Lowella Petties CMA   Authorized by:   Shaune Leeks MD   Signed by:   Lowella Petties CMA on 02/06/2010   Method used:   Electronically to        [facility] (retail)       [address]       [place], [place]  [id number]       Ph: [phone]       Fax: [phone]   RxID:   [id number]   Prior Medications: AVODART 0.5 MG CAPS (DUTASTERIDE) Take 1 capsule once a day GLYBURIDE MICRONIZED 6 MG TABS (GLYBURIDE MICRONIZED) 1 by mouth two times a day LEVOTHYROXINE SODIUM 25 MCG TABS (LEVOTHYROXINE SODIUM) 1 by mouth daily LIPITOR 80 MG  TABS (ATORVASTATIN CALCIUM) one tab by mouth at night. DIGOXIN 0.125 MG TABS (DIGOXIN) 1 tab once daily PRILOSEC 20 MG CPDR (OMEPRAZOLE) 1 daily by mouth NITROSTAT 0.3 MG  SUBL (NITROGLYCERIN) as needed FERRO-BOB 325 (65 FE) MG  TABS (FERROUS SULFATE) 1 by mouths a day TYLENOL EXTRA STRENGTH 500 MG  TABS (ACETAMINOPHEN) as needed PAIN USUALLY HELPS EXCEPT AT NIGHT CARVEDILOL 25 MG TABS (CARVEDILOL) 1 tablet twice a day by mouth TRAMADOL HCL 50 MG TABS (TRAMADOL HCL) 1 every 4 to 6 hours as needed pain PLETAL 100 MG TABS  (CILOSTAZOL) one tab by mouth twice a day CYANOCOBALAMIN 1000 MCG/ML SOLN (CYANOCOBALAMIN) Take injection every other week NASONEX 50 MCG/ACT SUSP (MOMETASONE FUROATE) once daily MUCUS RELIEF 400 MG TABS (GUAIFENESIN) as needed AMLODIPINE BESYLATE 5 MG TABS (AMLODIPINE BESYLATE) Take one tablet by mouth daily NOVOLOG 100 UNIT/ML SOLN (INSULIN ASPART) 35 units every AM, 35 units every PM OXYCODONE-ACETAMINOPHEN 5-500 MG CAPS (OXYCODONE-ACETAMINOPHEN) as needed Current Allergies: ! CODEINE SULFATE (CODEINE SULFATE)

## 2010-11-04 NOTE — Miscellaneous (Signed)
  Clinical Lists Changes  Problems: Added new problem of TREMOR (ICD-781.0) Assessed TREMOR as comment only -  Orders: Neurology Referral (Neuro)  Orders: Added new Referral order of Neurology Referral (Neuro) - Signed        Impression & Recommendations:  Problem # 1:  TREMOR (ICD-781.0)  Orders: Neurology Referral (Neuro)  Complete Medication List: 1)  Avodart 0.5 Mg Caps (Dutasteride) .... Take 1 capsule once a day 2)  Glyburide Micronized 6 Mg Tabs (Glyburide micronized) .Marland Kitchen.. 1 by mouth two times a day 3)  Levothyroxine Sodium 25 Mcg Tabs (Levothyroxine sodium) .Marland Kitchen.. 1 by mouth daily 4)  Lipitor 80 Mg Tabs (Atorvastatin calcium) .... One tab by mouth at night. 5)  Digoxin 0.125 Mg Tabs (Digoxin) .Marland Kitchen.. 1 tab once daily 6)  Nitrostat 0.3 Mg Subl (Nitroglycerin) .... As needed 7)  Ferro-bob 325 (65 Fe) Mg Tabs (Ferrous sulfate) .Marland Kitchen.. 1 by mouths a day 8)  Bd Insulin Syringe Ultrafine 31g X 5/16" 0.5 Ml Misc (Insulin syringe-needle u-100) .... Use twice a day 9)  Tylenol Extra Strength 500 Mg Tabs (Acetaminophen) .... As needed pain usually helps except at night 10)  Carvedilol 25 Mg Tabs (Carvedilol) .Marland Kitchen.. 1 tablet twice a day by mouth 11)  Tramadol Hcl 50 Mg Tabs (Tramadol hcl) .Marland Kitchen.. 1 every 4 to 6 hours as needed pain 12)  Pletal 100 Mg Tabs (Cilostazol) .... One tab by mouth twice a day 13)  Vitamin B-12 1000 Mcg Tabs (Cyanocobalamin) .... Take  1 tablet daily 14)  Nasonex 50 Mcg/act Susp (Mometasone furoate) .... Once daily 15)  Mucus Relief 400 Mg Tabs (Guaifenesin) .... As needed 16)  Amlodipine Besylate 5 Mg Tabs (Amlodipine besylate) .... Take one tablet by mouth daily 17)  Novolog 100 Unit/ml Soln (Insulin aspart) .... 35 units every am, 35 units every pm 18)  Oxycodone-acetaminophen 5-500 Mg Caps (Oxycodone-acetaminophen) .... As needed 19)  Levemir 100 Unit/ml Soln (Insulin detemir) .... 8 units subcut daily 20)  Warfarin Sodium 5 Mg Tabs (Warfarin sodium) .... Use  as directed by anticoagulation clinic 21)  Singulair 10 Mg Tabs (Montelukast sodium) .... Take 1 tablet everyday

## 2010-11-04 NOTE — Progress Notes (Signed)
Summary: Request for Physical therapy  Phone Note From Other Clinic Call back at [phone]   Caller: Melissa with [facility] Call For: Dr Hetty Ely Summary of Call: Melissa with [facility] said pt recently had GB surgery. Would Dr Hetty Ely approve home health to do physical therapy due to pt's weakness. Please advise.  Initial call taken by: Lewanda Rife LPN,  October 31, 2009 2:42 PM  Follow-up for Phone Call        Sure. Please give verbal order. Follow-up by: Shaune Leeks MD,  October 31, 2009 3:34 PM  Additional Follow-up for Phone Call Additional follow up Details #1::        Spoke with [facility] with [facility]  and gave verbal order as instructed.Lewanda Rife LPN  October 31, 2009 3:40 PM

## 2010-11-04 NOTE — Letter (Signed)
Summary: [facility] / DR. MATTEW WAKEFIELD  [facility] / DR. MATTEW WAKEFIELD   Imported By: Carin Primrose 01/30/2010 14:57:14  _____________________________________________________________________  External Attachment:    Type:   Image     Comment:   External Document

## 2010-11-04 NOTE — Medication Information (Signed)
Summary: rov/tm  Anticoagulant Therapy  Managed by: Leota Sauers, PharmD, BCPS, CPP Referring MD: Charlies Constable MD PCP: [name], MD Supervising MD: Ladona Ridgel MD, Sharlot Gowda Indication 1: Atrial Fibrillation (ICD-427.31) Lab Used: [facility] Site: [facility] INR POC 2.0 INR RANGE 2 - 3  Dietary changes: no    Health status changes: no    Bleeding/hemorrhagic complications: no    Recent/future hospitalizations: yes       Details: plan gallbladder removal 1/19  Any changes in medication regimen? yes       Details: stopped Januvia, lisinopril, HCTZ, started amlodipine and Augmentin #8/10     Comments: will come off Coumadin 5 day prior to surgery  - last dose coumadin today Thur 1/13  Current Medications (verified): 1)  Avodart 0.5 Mg Caps (Dutasteride) .... Take 1 Capsule Once A Day 2)  Glyburide Micronized 6 Mg Tabs (Glyburide Micronized) .Marland Kitchen.. 1 By Mouth Two Times A Day 3)  Levothyroxine Sodium 25 Mcg Tabs (Levothyroxine Sodium) .Marland Kitchen.. 1 By Mouth Daily 4)  Lipitor 80 Mg  Tabs (Atorvastatin Calcium) .... One Tab By Mouth At Night. 5)  Digoxin 0.125 Mg Tabs (Digoxin) .Marland Kitchen.. 1 Tab Once Daily 6)  Prilosec 20 Mg Cpdr (Omeprazole) .Marland Kitchen.. 1 Daily By Mouth 7)  Nitrostat 0.3 Mg  Subl (Nitroglycerin) .... As Needed 8)  Ferro-Bob 325 (65 Fe) Mg  Tabs (Ferrous Sulfate) .Marland Kitchen.. 1 By Mouths A Day 9)  Bayer Low Strength 81 Mg  Tbec (Aspirin) .Marland Kitchen.. 1 Daily By Mouth 10)  Humulin 70/30 70-30 %  Susp (Insulin Isophane & Regular) .... Take 35 Units Am and  Pm 11)  Accusure Insulin Syringe 31g X 5/16" 0.5 Ml  Misc (Insulin Syringe-Needle U-100) .... As Directed Two Times A Day 12)  Tylenol Extra Strength 500 Mg  Tabs (Acetaminophen) .... As Needed Pain Usually Helps Except At Night 13)  Carvedilol 25 Mg Tabs (Carvedilol) .Marland Kitchen.. 1 Tablet Twice A Day By Mouth 14)  Tramadol Hcl 50 Mg Tabs (Tramadol Hcl) .Marland Kitchen.. 1 Every 4 To 6 Hours As Needed Pain 15)  Pletal 100 Mg Tabs (Cilostazol) .... One Tab By Mouth Twice  A Day 16)  Coumadin 5 Mg Tabs (Warfarin Sodium) .... Take As Directed By Coumadin Clinic. 17)  Cyanocobalamin 1000 Mcg/ml Soln (Cyanocobalamin) .... Take Injection Every Other Week 18)  Nasonex 50 Mcg/act Susp (Mometasone Furoate) .... Once Daily 19)  Mucus Relief 400 Mg Tabs (Guaifenesin) .... As Needed 20)  Amlodipine Besylate 5 Mg Tabs (Amlodipine Besylate) .... Take One Tablet By Mouth Daily  Allergies (verified): 1)  ! Codeine Sulfate (Codeine Sulfate)  Anticoagulation Management History:      The patient is taking warfarin and comes in today for a routine follow up visit.  Positive risk factors for bleeding include an age of 75 years or older and presence of serious comorbidities.  The bleeding index is 'intermediate risk'.  Positive CHADS2 values include History of HTN, Age > 75 years old, and History of Diabetes.  The start date was 75/15/2007.  His last INR was 5.3 ratio.  Anticoagulation responsible provider: Ladona Ridgel MD, Sharlot Gowda.  INR POC: 2.0.  Cuvette Lot#: [id number].  Exp: 11/2010.    Anticoagulation Management Assessment/Plan:      The patient's current anticoagulation dose is Coumadin 5 mg tabs: Take as directed by coumadin clinic..  The target INR is 2.0-3.0.  The next INR is due 10/18/2009.  Anticoagulation instructions were given to patient.  Results were reviewed/authorized by Leota Sauers, PharmD, BCPS, CPP.  Prior Anticoagulation Instructions: INR 2.9 Continue 1 pill everyday except 1/2 pill on Mondays, Wednesdays and Fridays. Recheck in 10 days.   Current Anticoagulation Instructions: INR 2  Take 1 tab = 5mg  today  -  This is your last dose prior to surgery.  Call for follow up Coumadin check when you leave the hospital after surgery. [phone]

## 2010-11-04 NOTE — Letter (Signed)
Summary: Dr.Martin Webb,[facility],Note  Dr.Martin Webb,[facility],Note   Imported By: Beau Fanny 03/18/2010 10:14:24  _____________________________________________________________________  External Attachment:    Type:   Image     Comment:   External Document

## 2010-11-04 NOTE — Medication Information (Signed)
Summary: Justin Lane  Anticoagulant Therapy  Managed by: [name], RN, BSN Referring MD: Charlies Constable MD PCP: [name], MD Supervising MD: Riley Kill MD, Maisie Fus Indication 1: Atrial Fibrillation (ICD-427.31) Lab Used: [facility] Site: [facility] INR POC 2.9 INR RANGE 2 - 3  Dietary changes: yes       Details: Appetite is poor.   Health status changes: yes       Details: Had diarrhea over the weekend. Now just very weak. Some occ. nausea.  Bleeding/hemorrhagic complications: no    Recent/future hospitalizations: no    Any changes in medication regimen? yes       Details: Now off HCTZ.   Recent/future dental: no  Any missed doses?: no       Is patient compliant with meds? yes      Comments: Spoke with Dr. Hetty Ely office and was instructed to use Mylanta as needed.   Allergies: 1)  ! Codeine Sulfate (Codeine Sulfate)  Anticoagulation Management History:      The patient is taking warfarin and comes in today for a routine follow up visit.  Positive risk factors for bleeding include an age of 77 years or older and presence of serious comorbidities.  The bleeding index is 'intermediate risk'.  Positive CHADS2 values include History of HTN, Age > 22 years old, and History of Diabetes.  The start date was 08/19/2006.  His last INR was 5.3 ratio.  Anticoagulation responsible provider: Riley Kill MD, Maisie Fus.  INR POC: 2.9.  Cuvette Lot#: [id number].  Exp: 11/2010.    Anticoagulation Management Assessment/Plan:      The patient's current anticoagulation dose is Coumadin 5 mg tabs: Take as directed by coumadin clinic..  The target INR is 2.0-3.0.  The next INR is due 10/18/2009.  Anticoagulation instructions were given to patient.  Results were reviewed/authorized by [name], RN, BSN.  He was notified by [name], RN, BSN.         Prior Anticoagulation Instructions: INR 6.6   INR from lab-5.3  Do not take tonight's dose of Coumadin.   Called spoke with pt advised hold x 2  doses then start taking 1 tablet daily except 1/2 tablet on MWF.  Made OV on 10/03/09.    Current Anticoagulation Instructions: INR 2.9 Continue 1 pill everyday except 1/2 pill on Mondays, Wednesdays and Fridays. Recheck in 10 days.

## 2010-11-04 NOTE — Assessment & Plan Note (Signed)
Summary: Justin Lane   Visit Type:  Follow-up Referring Provider:  n/a Primary Provider:  [name], MD   History of Present Illness: 77 year-old male with history of CAD s/p remote inferior MI and more recent stenting of the RCA in 2004. He has been stable from a CV perspective since that time. The patient has paroxysmal atrial fibrillation and is maintained on chronic anticoagulation with coumadin.   He says he has been doing well from a cardiac standpoint with no chest pain or palpitations. Overall he feels like he is getting gradually weaker.  His other problems include peripheral vascular disease status post bilateral iliac stenting by Dr. Samule Lane. He also has hypertension, hyperlipidemia, and diabetes. He also has chronic renal insufficiency with creatinines in the range of 1.7. He has seen Dr. Hyman Lane for this.  Current Medications (verified): 1)  Avodart 0.5 Mg Caps (Dutasteride) .... Take 1 Capsule Once A Day 2)  Glyburide Micronized 6 Mg Tabs (Glyburide Micronized) .Marland Kitchen.. 1 By Mouth Two Times A Day 3)  Levothyroxine Sodium 25 Mcg Tabs (Levothyroxine Sodium) .Marland Kitchen.. 1 By Mouth Daily 4)  Lipitor 80 Mg  Tabs (Atorvastatin Calcium) .... One Tab By Mouth At Night. 5)  Digoxin 0.125 Mg Tabs (Digoxin) .Marland Kitchen.. 1 Tab Once Daily 6)  Nitrostat 0.3 Mg  Subl (Nitroglycerin) .... As Needed 7)  Ferro-Bob 325 (65 Fe) Mg  Tabs (Ferrous Sulfate) .Marland Kitchen.. 1 By Mouths A Day 8)  Bd Insulin Syringe Ultrafine 31g X 5/16" 0.5 Ml Misc (Insulin Syringe-Needle U-100) .... Use Three Times A Day 9)  Tylenol Extra Strength 500 Mg  Tabs (Acetaminophen) .... As Needed Pain Usually Helps Except At Night 10)  Carvedilol 25 Mg Tabs (Carvedilol) .Marland Kitchen.. 1 Tablet Twice A Day By Mouth 11)  Tramadol Hcl 50 Mg Tabs (Tramadol Hcl) .Marland Kitchen.. 1 Every 4 To 6 Hours As Needed Pain 12)  Pletal 100 Mg Tabs (Cilostazol) .... One Tab By Mouth Twice A Day 13)  Vitamin B-12 1000 Mcg Tabs (Cyanocobalamin) .... 2,000 Mg Once Daily 14)  Amlodipine Besylate  5 Mg Tabs (Amlodipine Besylate) .... Take One Tablet By Mouth Daily 15)  Novolog 100 Unit/ml Soln (Insulin Aspart) .... 35 Units Every Am, 35 Units Every Pm 16)  Levemir 100 Unit/ml Soln (Insulin Detemir) .Marland Kitchen.. 15 Units Subcut Daily 17)  Warfarin Sodium 5 Mg Tabs (Warfarin Sodium) .... Use As Directed By Anticoagulation Clinic 18)  Singulair 10 Mg Tabs (Montelukast Sodium) .... Take 1 Tablet Everyday 19)  Aricept 10 Mg Tabs (Donepezil Hcl) .... One Tablet Once Daily 20)  Excedrin Extra Strength 250-250-65 Mg Tabs (Aspirin-Acetaminophen-Caffeine) .... As Needed  Allergies: 1)  ! Codeine Sulfate (Codeine Sulfate)  Past History:  Past Medical History: Reviewed history from 05/13/2010 and no changes required. Myocardial infarction, hx of (06/06/2003) Myocardial infarction, hx of (07/05/1996) Current Problems:  HEMORRHOIDS (ICD-455.6) PRERENAL AZOTEMIA, MILD (ICD-790.6) GASTRITIS (ICD-535.50) HIATAL HERNIA (ICD-553.3) GERD (ICD-530.81) RENAL INSUFFICIENCY (ICD-588.9) ATRIAL ARRHYTHMIAS (ICD-427.89) MI (ICD-410.90) RENAL INSUFFICIENCY, CHRONIC (ICD-585.9) HYPOPOTASSEMIA (ICD-276.8) MYOCARDIAL INFARCTION, HX OF (ICD-412) HYPOTHYROIDISM (ICD-244.9) ATRIAL FIBRILLATION ON COUMADIN (ICD-427.31) GOUT (ICD-274.9) B12 DEFICIENCY (ICD-266.2) ANEMIA, MILD (ICD-285.9) BENIGN PROSTATIC HYPERTROPHY, HX OF (ICD-V13.8) RENAL CALCULUS, RECURRENT LITHOTRIPSY (ICD-592.0) HYPERTENSION (ICD-401.9) DIVERTICULAR DISEASE VIA ACBE (ICD-562.10) HYPERLIPIDEMIA (ICD-272.4) PVD LE'S (ICD-443.9) DYSPNEA, CHRONIC (ICD-786.05) DM (ICD-250.00) CAD (MI 10/97, 9/04) (ICD-414.00)  1. Coronary artery disease status post remote diaphragmatic wall     infarction and status post drug-eluting stent to the right coronary     artery in 2004 for in-stent restenosis, now  stable. 2. Good left ventricular function. 3. Paroxysmal atrial fibrillation treated with rate control     medications and Coumadin. 4.  Hypertension. 5. Diabetes. 6. Hyperlipidemia. 7. Peripheral vascular disease status post bilateral renal stenting. 8. Renal insufficiency with a creatinine in the range of 2.   MMSE 05/2010: 28/30 (1/3 on recall)  Review of Systems       ROS is negative except as outlined in HPI.   Vital Signs:  Patient profile:   77 year old male Height:      69 inches Weight:      211 pounds BP sitting:   100 / 60  (left arm)  Vitals Entered By: Laurance Flatten CMA (August 25, 2010 10:16 AM)  Physical Exam  Additional Exam:  Gen. Well-nourished, in no distress   Neck: No JVD, thyroid not enlarged, no carotid bruits Lungs: No tachypnea, clear without rales, rhonchi or wheezes Cardiovascular: Rhythm regular, PMI not displaced,  heart sounds  normal, no murmurs or gallops, 1+ bilateral peripheral edema, pulses normal in all 4 extremities. Abdomen: BS normal, abdomen soft and non-tender without masses or organomegaly, no hepatosplenomegaly. MS: No deformities, no cyanosis or clubbing   Neuro:  No focal sns   Skin:  no lesions    Impression & Recommendations:  Problem # 1:  CAD, NATIVE VESSEL (ICD-414.01) He has CAD as described in the history of present illness. He has had no recent chest pain. This polyp is stable. His updated medication list for this problem includes:    Nitrostat 0.3 Mg Subl (Nitroglycerin) .Marland Kitchen... As needed    Carvedilol 25 Mg Tabs (Carvedilol) .Marland Kitchen... 1 tablet twice a day by mouth    Pletal 100 Mg Tabs (Cilostazol) ..... One tab by mouth twice a day    Amlodipine Besylate 5 Mg Tabs (Amlodipine besylate) .Marland Kitchen... Take one tablet by mouth daily    Warfarin Sodium 5 Mg Tabs (Warfarin sodium) ..... Use as directed by anticoagulation clinic  Problem # 2:  ATRIAL ARRHYTHMIAS (ICD-427.89) He has paroxysmal atrial fibrillation managed with Coumadin and rate control. This appears stable. His updated medication list for this problem includes:    Nitrostat 0.3 Mg Subl (Nitroglycerin)  .Marland Kitchen... As needed    Carvedilol 25 Mg Tabs (Carvedilol) .Marland Kitchen... 1 tablet twice a day by mouth    Pletal 100 Mg Tabs (Cilostazol) ..... One tab by mouth twice a day    Amlodipine Besylate 5 Mg Tabs (Amlodipine besylate) .Marland Kitchen... Take one tablet by mouth daily    Warfarin Sodium 5 Mg Tabs (Warfarin sodium) ..... Use as directed by anticoagulation clinic  Problem # 3:  RENAL FAILURE, ACUTE (ICD-584.9) His creatinines have been in the range of 1.7. We will repeat a BMP today.  Problem # 4:  HYPERTENSION (ICD-401.9) This is well controlled on current medications. His updated medication list for this problem includes:    Carvedilol 25 Mg Tabs (Carvedilol) .Marland Kitchen... 1 tablet twice a day by mouth    Amlodipine Besylate 5 Mg Tabs (Amlodipine besylate) .Marland Kitchen... Take one tablet by mouth daily  Other Orders: EKG w/ Interpretation (93000) TLB-BMP (Basic Metabolic Panel-BMET) (80048-METABOL)  Patient Instructions: 1)  Your physician recommends that you continue on your current medications as directed. Please refer to the Current Medication list given to you today. 2)  Your physician wants you to follow-up in: 6 months with Dr. Clifton James. You will receive a reminder letter in the mail two months in advance. If you don't receive a letter, please call our  office to schedule the follow-up appointment. 3)  Labwork today: bmet (427.31;414.01).  Prevention & Chronic Care Immunizations   Influenza vaccine: Fluvax 3+  (07/07/2010)    Tetanus booster: 02/07/2003: Td    Pneumococcal vaccine: Pneumovax  (09/04/1997)    H. zoster vaccine: Not documented  Colorectal Screening   Hemoccult: Negative  (03/05/2006)    Colonoscopy: Not documented  Other Screening   PSA: 0.78  (02/02/2006)   Smoking status: quit  (01/03/2007)  Diabetes Mellitus   HgbA1C: 8.5  (06/30/2010)    Eye exam: normal  (03/20/2008)    Foot exam: yes  (09/18/2008)   High risk foot: Not documented   Foot care education: Not documented     Urine microalbumin/creatinine ratio: Not documented  Lipids   Total Cholesterol: 148  (10/14/2007)   LDL: 83  (10/14/2007)   LDL Direct: 114.3  (07/13/2007)   HDL: 33.4  (10/14/2007)   Triglycerides: 159  (10/14/2007)    SGOT (AST): 22  (05/13/2010)   SGPT (ALT): 19  (05/13/2010)   Alkaline phosphatase: 53  (05/13/2010)   Total bilirubin: 0.6  (05/13/2010)  Hypertension   Last Blood Pressure: 100 / 60  (08/25/2010)   Serum creatinine: 1.4  (05/13/2010)   Serum potassium 4.4  (05/13/2010)  Self-Management Support :    Diabetes self-management support: Not documented    Hypertension self-management support: Not documented    Lipid self-management support: Not documented

## 2010-11-04 NOTE — Medication Information (Signed)
Summary: Denial for Humulin/[employer]  Denial for Humulin[employer]   Imported By: Lanelle Bal 10/18/2009 13:57:09  _____________________________________________________________________  External Attachment:    Type:   Image     Comment:   External Document

## 2010-11-06 NOTE — Miscellaneous (Signed)
  Clinical Lists Changes  Medications: Changed medication from LEVOTHYROXINE SODIUM 25 MCG TABS (LEVOTHYROXINE SODIUM) 1 by mouth daily to LEVOTHYROXINE SODIUM 25 MCG TABS (LEVOTHYROXINE SODIUM) 1 by mouth daily except 2 tablets on Sunday.

## 2010-11-06 NOTE — Letter (Signed)
Summary: Generic Letter  [place] [facility]  [address] [place], [place] [address]   Phone: [phone]  Fax: [phone]    10/13/2010    Re:   Justin Lane   [address]   [place], [place]  [address] DOB:  January 21, 1930                Sincerely,   Delilah Shan CMA (AAMA)

## 2010-11-06 NOTE — Progress Notes (Signed)
----   Converted from flag ---- ---- 10/15/2010 12:21 PM, Crawford Givens MD wrote: Thank you.  Please notify the patient [id number] and then convert flag to note to go in the chart.  Thanks.   ---- 10/15/2010 12:16 PM, Delilah Shan CMA (AAMA) wrote: I contacted Dr. Zannie Cove office nurse.  I told her that we had the last 2 OV notes but that you would like some input specifically on the cause and tx. of the tremor.  She states that Dr. Terrace Arabia will not be in the office until tomorrow but that she will pass this message along at that time. ------------------------------

## 2010-11-06 NOTE — Progress Notes (Signed)
  Phone Note Outgoing Call   Summary of Call: Please call over to Neuro clinic- Dr. Zannie Cove office- and ask if they have specific recs about treatment for tremor.  I appreciate their input. Please let me know so I can call or send notice to the patient.  Initial call taken by: Crawford Givens MD,  October 12, 2010 5:56 PM  Follow-up for Phone Call        Note faxed.  Lugene Fuquay CMA (AAMA)  October 13, 2010 11:48 AM

## 2010-11-06 NOTE — Consult Note (Signed)
Summary: [facility]  [facility]   Imported By: Lanelle Bal 10/17/2010 14:29:57  _____________________________________________________________________  External Attachment:    Type:   Image     Comment:   External Document

## 2010-11-06 NOTE — Assessment & Plan Note (Signed)
Summary: 3 MONTH FOLLOW UP/[facility]   Vital Signs:  Patient profile:   77 year old male Height:      69 inches Weight:      211.25 pounds BMI:     31.31 Temp:     98.4 degrees F oral Pulse rate:   88 / minute Pulse rhythm:   regular BP sitting:   126 / 50  (left arm) Cuff size:   large  Vitals Entered By: Delilah Shan CMA Duncan Dull) (October 03, 2010 3:12 PM) CC: 3 months follow up   History of Present Illness: Diabetes:  Using medications without difficulties: yes Hypoglycemic episodes:no Hyperglycemic episodes:rare Feet problems:occ numbness Blood Sugars averaging: 100-200 in AM eye exam within last year:  due in 10/2010, patient to schedule  tremor- has had follow up with dr. Terrace Arabia.  We are requesting records today.  L>R tremor w/o recent change.  he is asking about what can be done.    Has noted fatigue and "not doing as much".  No focal change o/w, but family has noted the dec in activity.  No fevers, blood in stool, or other localizing sx.    Allergies: 1)  ! Codeine Sulfate (Codeine Sulfate)  Past History:  Social History: Last updated: 05/13/2010 Occupation: [employer] Retired Married lives w/ wife    3 children quit smoking 1988, quit after MI no alcohol   Past Medical History: Myocardial infarction, hx of (06/06/2003) Myocardial infarction, hx of (07/05/1996) Current Problems:  HEMORRHOIDS (ICD-455.6) PRERENAL AZOTEMIA, MILD (ICD-790.6) GASTRITIS (ICD-535.50) HIATAL HERNIA (ICD-553.3) GERD (ICD-530.81) RENAL INSUFFICIENCY (ICD-588.9) ATRIAL ARRHYTHMIAS (ICD-427.89) MI (ICD-410.90) RENAL INSUFFICIENCY, CHRONIC (ICD-585.9) HYPOPOTASSEMIA (ICD-276.8) MYOCARDIAL INFARCTION, HX OF (ICD-412) HYPOTHYROIDISM (ICD-244.9) ATRIAL FIBRILLATION ON COUMADIN (ICD-427.31) GOUT (ICD-274.9) B12 DEFICIENCY (ICD-266.2) ANEMIA, MILD (ICD-285.9) BENIGN PROSTATIC HYPERTROPHY, HX OF (ICD-V13.8) RENAL CALCULUS, RECURRENT LITHOTRIPSY (ICD-592.0) HYPERTENSION  (ICD-401.9) DIVERTICULAR DISEASE VIA ACBE (ICD-562.10) HYPERLIPIDEMIA (ICD-272.4) PVD LE'S (ICD-443.9) DYSPNEA, CHRONIC (ICD-786.05) DM (ICD-250.00)- goal A1c  ~8 due to concern for hypoglycemia CAD (MI 10/97, 9/04) (ICD-414.00)  1. Coronary artery disease status post remote diaphragmatic wall     infarction and status post drug-eluting stent to the right coronary     artery in 2004 for in-stent restenosis, now stable. 2. Good left ventricular function. 3. Paroxysmal atrial fibrillation treated with rate control     medications and Coumadin. 4. Hypertension. 5. Diabetes. 6. Hyperlipidemia. 7. Peripheral vascular disease status post bilateral renal stenting. 8. Renal insufficiency with a creatinine in the range of 2.   MMSE 05/2010: 28/30 (1/3 on recall)  Review of Systems       See HPI.  Otherwise negative.    Physical Exam  General:  GEN: nad, alert and oriented HEENT: mucous membranes moist NECK: supple w/o LA CV: regular rate and rhythm, no ectopy PULM: ctab, no inc wob ABD: soft, +bs EXT: no edema SKIN: no acute rash bilateral  hand tremor noted during exam, this is episodic.  L>R.  He is able to temporarily control and stop the movement.  No new weakness.   Diabetes Management Exam:    Foot Exam (with socks and/or shoes not present):       Sensory-Pinprick/Light touch:          Left medial foot (L-4): normal          Left dorsal foot (L-5): normal          Left lateral foot (S-1): normal          Right medial foot (L-4):  normal          Right dorsal foot (L-5): normal          Right lateral foot (S-1): normal       Sensory-Monofilament:          Left foot: normal          Right foot: normal       Inspection:          Left foot: normal          Right foot: normal       Nails:          Left foot: thickened          Right foot: thickened   Impression & Recommendations:  Problem # 1:  TREMOR (ICD-781.0) requesting records from neuro and will notify patient  at that point. He agrees.    Problem # 2:  DM (ICD-250.00) no change in meds due to concern for low glucose.  I reviewed his numbers and talked with him about the plan.  I would stick to DM diet, but I wouldn't change meds.   His updated medication list for this problem includes:    Glyburide Micronized 6 Mg Tabs (Glyburide micronized) .Marland Kitchen... 1 by mouth two times a day    Novolog 100 Unit/ml Soln (Insulin aspart) .Marland KitchenMarland KitchenMarland KitchenMarland Kitchen 35 units every am, 35 units every pm    Levemir 100 Unit/ml Soln (Insulin detemir) .Marland KitchenMarland KitchenMarland KitchenMarland Kitchen 15 units subcut daily  Problem # 3:  FATIGUE (ICD-780.79) Will check tsh and cbc.  Unclear source.  Will follow up as needed .  Orders: T-CBC w/Diff ([id number]) T-TSH ([id number]) Specimen Handling (30865) Venipuncture (78469)  Complete Medication List: 1)  Avodart 0.5 Mg Caps (Dutasteride) .... Take 1 capsule once a day 2)  Glyburide Micronized 6 Mg Tabs (Glyburide micronized) .Marland Kitchen.. 1 by mouth two times a day 3)  Levothyroxine Sodium 25 Mcg Tabs (Levothyroxine sodium) .Marland Kitchen.. 1 by mouth daily 4)  Lipitor 80 Mg Tabs (Atorvastatin calcium) .... One tab by mouth at night. 5)  Digoxin 0.125 Mg Tabs (Digoxin) .Marland Kitchen.. 1 tab once daily 6)  Nitrostat 0.3 Mg Subl (Nitroglycerin) .... As needed 7)  Bd Insulin Syringe Ultrafine 31g X 5/16" 0.5 Ml Misc (Insulin syringe-needle u-100) .... Use three times a day 8)  Tylenol Extra Strength 500 Mg Tabs (Acetaminophen) .... As needed pain usually helps except at night 9)  Carvedilol 25 Mg Tabs (Carvedilol) .Marland Kitchen.. 1 tablet twice a day by mouth 10)  Tramadol Hcl 50 Mg Tabs (Tramadol hcl) .Marland Kitchen.. 1 every 4 to 6 hours as needed pain 11)  Pletal 100 Mg Tabs (Cilostazol) .... One tab by mouth twice a day 12)  Vitamin B-12 1000 Mcg Tabs (Cyanocobalamin) .... 2,000 mg once daily 13)  Amlodipine Besylate 5 Mg Tabs (Amlodipine besylate) .... Take one tablet by mouth daily 14)  Novolog 100 Unit/ml Soln (Insulin aspart) .... 35 units every am, 35 units every pm 15)   Levemir 100 Unit/ml Soln (Insulin detemir) .Marland Kitchen.. 15 units subcut daily 16)  Warfarin Sodium 5 Mg Tabs (Warfarin sodium) .... Use as directed by anticoagulation clinic 17)  Singulair 10 Mg Tabs (Montelukast sodium) .... Take 1 tablet everyday 18)  Aricept 10 Mg Tabs (Donepezil hcl) .... One tablet once daily 19)  Excedrin Extra Strength 250-250-65 Mg Tabs (Aspirin-acetaminophen-caffeine) .... As needed 20)  Ra Iron 27 Mg Tabs (Ferrous sulfate) .... Take 1 tablet by mouth once a day 21)  Vitamin B-12 500 Mcg Tabs (Cyanocobalamin) .Marland KitchenMarland KitchenMarland Kitchen  2500 mg. once daily 22)  Vitamin D 1000 Unit Tabs (Cholecalciferol) .... Take 1 tablet by mouth once a day  Patient Instructions: 1)  Don't change your meds.  We'll contact you with your lab report next week.  I'll get the records from Neuro and if I have any other ideas, I'll let you know.  Take care.  2)  I'd like to see you back in 3 months for a OV with A1c before the visit. 250.00.   Orders Added: 1)  Est. Patient Level IV [[address]] 2)  T-CBC w/Diff [[id number]] 3)  T-TSH [[id number]] 4)  Specimen Handling [99000] 5)  Venipuncture [13086]    Current Allergies (reviewed today): ! CODEINE SULFATE (CODEINE SULFATE)

## 2010-11-06 NOTE — Miscellaneous (Signed)
Clinical Lists Changes  Problems: Removed problem of RENAL FAILURE, ACUTE (ICD-584.9) Removed problem of TOE PAIN (ICD-729.5) Removed problem of ABDOMINAL PAIN, EPIGASTRIC (ICD-789.06) Removed problem of PRERENAL AZOTEMIA, MILD (ICD-790.6) Removed problem of MYOCARDIAL INFARCTION, HX OF (ICD-412) Removed problem of CAD (MI 10/97, 9/04) (ICD-414.00) Observations: Added new observation of PAST SURG HX: CARDIOLITE NML 05/15/1997 ANGIOPLASTY, MULTIPLE 10/97 STRESS TEST 3/98 FLEX FISSURE   BE -DIVERTICS 9/98 LITHOTRIPSY DUE TO RENAL LITHIASIS 04/29/00 [facility] (ADMIT) R/O MI  9/16 - 06/26/03 CATH AND PICA  FOR IN-STENT RESTENOSIS 06/22/03 EGD - GASTRITIS 06/24/03 ADENOSINE CARDIOLITE NML EF 63% 12/25/03 BILAT. COMMON ILIAC STENTING 05/16/04 ADENOSINE MYOVIEW NML 02/12/06 [facility] PAFIB 11/15 - 08/21/2006 [facility], ELEVATED INR ARI 12/18-12/20/07 COLONOSCOPY, INTERNAL HEMORRHOIDS, NO POLYPS 11/09/06 EGD GASTRITIS (JACOBS) 12/08/06 HOSP Acute on Chronic Renal Insuff  L4/5 Herniated disc  12/17-12/19/08 L4/5 LAMINECTOMY (DR YATES) 09/28/07 CT ABD W/   NO ACUTE FINDINGS FATTY LIVER  4CM R RENAL CYST  08/31/08 HOSP NEAR SYNCOPE  ORTHOST HYPOTENS  ACUTE CHOLEY SXS  ARF (Cr 3.16) 1/3-10/09/2009 CT ABD/PELVIS  POSS ACUTE CHOLEY 10/08/2009 HIDA SCAN  NONFILLING GB 10/09/2009 CHOLEYCYSTECTOMY, OPEN  ATTEMPTED LAP.Marland KitchenPURULENT GB  (DR WAKEFIELD) 10/23/2009 HIDA SCAN BILE LEAK  IN GB FOSSA 11/13/2009 HOSP POSTOP BILE LEAK HYPOKALEMIA DM HTN ARF,RESOLVED 2/10-2/15./2011 ERCP CBD DILATION POSS STONE IN AMPULLA 11/15/2009 CT ABD/PELVIS  FLUID IN GB FOSSA DRAIN IN PLACE C/W POSTOP LEAK  11/16/2009 MRI 07/2010 with moderate diffuse atrophy and mild chronic small vessel ischemic disease (10/12/2010 17:50) Added new observation of PAST MED HX: HEMORRHOIDS (ICD-455.6) GASTRITIS (ICD-535.50) HIATAL HERNIA (ICD-553.3) GERD (ICD-530.81) ATRIAL ARRHYTHMIAS (ICD-427.89) MI (ICD-410.90),  (1997 and 2004) RENAL INSUFFICIENCY, CHRONIC  (ICD-585.9) HYPOPOTASSEMIA (ICD-276.8) HYPOTHYROIDISM (ICD-244.9) ATRIAL FIBRILLATION ON COUMADIN (ICD-427.31) GOUT (ICD-274.9) B12 DEFICIENCY (ICD-266.2) ANEMIA, MILD (ICD-285.9) BENIGN PROSTATIC HYPERTROPHY, HX OF (ICD-V13.8) RENAL CALCULUS, RECURRENT LITHOTRIPSY (ICD-592.0) HYPERTENSION (ICD-401.9) DIVERTICULAR DISEASE VIA ACBE (ICD-562.10) HYPERLIPIDEMIA (ICD-272.4) PVD LE'S (ICD-443.9) DYSPNEA, CHRONIC (ICD-786.05) DM (ICD-250.00)- goal A1c  ~8 due to concern for hypoglycemia  1. Coronary artery disease status post remote diaphragmatic wall     infarction and status post drug-eluting stent to the right coronary     artery in 2004 for in-stent restenosis, now stable. 2. Good left ventricular function. 3. Paroxysmal atrial fibrillation treated with rate control     medications and Coumadin. 4. Hypertension. 5. Diabetes. 6. Hyperlipidemia. 7. Peripheral vascular disease status post bilateral renal stenting. 8. Renal insufficiency with a creatinine in the range of 2.   MMSE 05/2010: 28/30 (1/3 on recall) (10/12/2010 17:50)      Past History:  Past Medical History: HEMORRHOIDS (ICD-455.6) GASTRITIS (ICD-535.50) HIATAL HERNIA (ICD-553.3) GERD (ICD-530.81) ATRIAL ARRHYTHMIAS (ICD-427.89) MI (ICD-410.90),  (1997 and 2004) RENAL INSUFFICIENCY, CHRONIC (ICD-585.9) HYPOPOTASSEMIA (ICD-276.8) HYPOTHYROIDISM (ICD-244.9) ATRIAL FIBRILLATION ON COUMADIN (ICD-427.31) GOUT (ICD-274.9) B12 DEFICIENCY (ICD-266.2) ANEMIA, MILD (ICD-285.9) BENIGN PROSTATIC HYPERTROPHY, HX OF (ICD-V13.8) RENAL CALCULUS, RECURRENT LITHOTRIPSY (ICD-592.0) HYPERTENSION (ICD-401.9) DIVERTICULAR DISEASE VIA ACBE (ICD-562.10) HYPERLIPIDEMIA (ICD-272.4) PVD LE'S (ICD-443.9) DYSPNEA, CHRONIC (ICD-786.05) DM (ICD-250.00)- goal A1c  ~8 due to concern for hypoglycemia  1. Coronary artery disease status post remote diaphragmatic wall     infarction and status post drug-eluting stent to the right  coronary     artery in 2004 for in-stent restenosis, now stable. 2. Good left ventricular function. 3. Paroxysmal atrial fibrillation treated with rate control     medications and Coumadin. 4. Hypertension. 5. Diabetes. 6. Hyperlipidemia. 7. Peripheral vascular disease status post bilateral renal stenting. 8. Renal insufficiency with  a creatinine in the range of 2.   MMSE 05/2010: 28/30 (1/3 on recall)  Past Surgical History: CARDIOLITE NML 05/15/1997 ANGIOPLASTY, MULTIPLE 10/97 STRESS TEST 3/98 FLEX FISSURE   BE -DIVERTICS 9/98 LITHOTRIPSY DUE TO RENAL LITHIASIS 04/29/00 [facility] (ADMIT) R/O MI  9/16 - 06/26/03 CATH AND PICA  FOR IN-STENT RESTENOSIS 06/22/03 EGD - GASTRITIS 06/24/03 ADENOSINE CARDIOLITE NML EF 63% 12/25/03 BILAT. COMMON ILIAC STENTING 05/16/04 ADENOSINE MYOVIEW NML 02/12/06 [facility] PAFIB 11/15 - 08/21/2006 [facility], ELEVATED INR ARI 12/18-12/20/07 COLONOSCOPY, INTERNAL HEMORRHOIDS, NO POLYPS 11/09/06 EGD GASTRITIS (JACOBS) 12/08/06 HOSP Acute on Chronic Renal Insuff  L4/5 Herniated disc  12/17-12/19/08 L4/5 LAMINECTOMY (DR YATES) 09/28/07 CT ABD W/   NO ACUTE FINDINGS FATTY LIVER  4CM R RENAL CYST  08/31/08 HOSP NEAR SYNCOPE  ORTHOST HYPOTENS  ACUTE CHOLEY SXS  ARF (Cr 3.16) 1/3-10/09/2009 CT ABD/PELVIS  POSS ACUTE CHOLEY 10/08/2009 HIDA SCAN  NONFILLING GB 10/09/2009 CHOLEYCYSTECTOMY, OPEN  ATTEMPTED LAP.Marland KitchenPURULENT GB  (DR WAKEFIELD) 10/23/2009 HIDA SCAN BILE LEAK  IN GB FOSSA 11/13/2009 HOSP POSTOP BILE LEAK HYPOKALEMIA DM HTN ARF,RESOLVED 2/10-2/15./2011 ERCP CBD DILATION POSS STONE IN AMPULLA 11/15/2009 CT ABD/PELVIS  FLUID IN GB FOSSA DRAIN IN PLACE C/W POSTOP LEAK  11/16/2009 MRI 07/2010 with moderate diffuse atrophy and mild chronic small vessel ischemic disease   Allergies: 1)  ! Codeine Sulfate (Codeine Sulfate)

## 2010-11-06 NOTE — Medication Information (Signed)
Summary: rov/sp  Anticoagulant Therapy  Managed by: Louann Sjogren, PharmD Referring MD: Charlies Constable MD PCP: [name], MD Supervising MD: Daleen Squibb MD,Thomas Indication 1: Atrial Fibrillation (ICD-427.31) Lab Used: [facility] [place] Site: [address] INR POC 2.3 INR RANGE 2 - 3  Dietary changes: no    Health status changes: no    Bleeding/hemorrhagic complications: no    Recent/future hospitalizations: no    Any changes in medication regimen? no    Recent/future dental: no  Any missed doses?: no       Is patient compliant with meds? yes       Allergies: 1)  ! Codeine Sulfate (Codeine Sulfate)  Anticoagulation Management History:      The patient is taking warfarin and comes in today for a routine follow up visit.  Positive risk factors for bleeding include an age of 75 years or older and presence of serious comorbidities.  The bleeding index is 'intermediate risk'.  Positive CHADS2 values include History of HTN, Age > 39 years old, and History of Diabetes.  The start date was 08/19/2006.  His last INR was 5.3 ratio and today's INR is 2.3.  Anticoagulation responsible provider: Wall MD,Thomas.  INR POC: 2.3.  Cuvette Lot#: [id number].  Exp: 09/2011.    Anticoagulation Management Assessment/Plan:      The patient's current anticoagulation dose is Warfarin sodium 5 mg tabs: Use as directed by Anticoagulation Clinic.  The target INR is 2.0-3.0.  The next INR is due 10/17/2010.  Anticoagulation instructions were given to patient.  Results were reviewed/authorized by Louann Sjogren, PharmD.         Prior Anticoagulation Instructions: INR 1.7  Take 1 1/2 tablets today then resume same dose of 1 tablet every day except 1 1/2 tablets on Sunday.  Recheck INR in 2 weeks.   Current Anticoagulation Instructions: INR 2.3  Continue taking 1 tablet daily except take 1 1/2 tablet on Sunday.  Return to clinic in 4 weeks on Friday, January 13th at 11:45AM.

## 2010-11-06 NOTE — Medication Information (Signed)
Summary: rov  Anticoagulant Therapy  Managed by: Louann Sjogren, PharmD Referring MD: Charlies Constable MD PCP: [name], MD Supervising MD: Antoine Poche MD,Jaz Laningham Indication 1: Atrial Fibrillation (ICD-427.31) Lab Used: [facility] [place] Site: [address] INR POC 1.9 INR RANGE 2 - 3  Dietary changes: no    Health status changes: no    Bleeding/hemorrhagic complications: no    Recent/future hospitalizations: no    Any changes in medication regimen? no    Recent/future dental: no  Any missed doses?: no       Is patient compliant with meds? yes       Allergies: 1)  ! Codeine Sulfate (Codeine Sulfate)  Anticoagulation Management History:      The patient is taking warfarin and comes in today for a routine follow up visit.  Positive risk factors for bleeding include an age of 75 years or older and presence of serious comorbidities.  The bleeding index is 'intermediate risk'.  Positive CHADS2 values include History of HTN, Age > 60 years old, and History of Diabetes.  The start date was 08/19/2006.  His last INR was 2.3 and today's INR is 1.9.  Anticoagulation responsible provider: Andy Moye MD,Glennie Rodda.  INR POC: 1.9.  Cuvette Lot#: [id number].  Exp: 09/2011.    Anticoagulation Management Assessment/Plan:      The patient's current anticoagulation dose is Warfarin sodium 5 mg tabs: Use as directed by Anticoagulation Clinic.  The target INR is 2.0-3.0.  The next INR is due 11/13/2010.  Anticoagulation instructions were given to patient.  Results were reviewed/authorized by Louann Sjogren, PharmD.  He was notified by Louann Sjogren PharmD.         Prior Anticoagulation Instructions: INR 2.3  Continue taking 1 tablet daily except take 1 1/2 tablet on Sunday.  Return to clinic in 4 weeks on Friday, January 13th at 11:45AM.  Current Anticoagulation Instructions: INR 1.9 (goal 2-3)  Continue taking 1 tablet everyday except take 1 1/2 tablets on Sundays. Next  INR check: Thursday, Feb. 9th at 11:45AM.

## 2010-11-06 NOTE — Letter (Signed)
Summary: [facility]  [facility]   Imported By: Lanelle Bal 10/17/2010 14:17:12  _____________________________________________________________________  External Attachment:    Type:   Image     Comment:   External Document

## 2010-11-06 NOTE — Progress Notes (Signed)
  Phone Note Outgoing Call   Summary of Call: I called patient.  There was a mention of parkinsonism, but not parkinson's disease, on the neuro reports.  I talked to Dr. Terrace Arabia and she would like patient to stay on current meds and then follow up at the neuro clinic.  She can check him re: the tremor.  I told patient about the parkinsonism in the record, but that this was not necessarily parkinson's disease.  he understood.  No changes in meds for now.  I appreciate neuro help. Crawford Givens MD  October 17, 2010 1:38 PM

## 2010-11-07 NOTE — Consult Note (Signed)
Summary: [facility]   Imported By: Roderic Ovens 10/21/2009 09:58:49  _____________________________________________________________________  External Attachment:    Type:   Image     Comment:   External Document

## 2010-11-11 ENCOUNTER — Encounter: Payer: Self-pay | Admitting: *Deleted

## 2010-11-12 ENCOUNTER — Encounter: Payer: Self-pay | Admitting: *Deleted

## 2010-11-12 NOTE — Miscellaneous (Signed)
Summary: [facility]  [facility]   Imported By: Kassie Mends 11/07/2010 08:41:43  _____________________________________________________________________  External Attachment:    Type:   Image     Comment:   Scanned Image

## 2010-11-13 ENCOUNTER — Encounter: Payer: Self-pay | Admitting: Internal Medicine

## 2010-11-13 ENCOUNTER — Encounter (INDEPENDENT_AMBULATORY_CARE_PROVIDER_SITE_OTHER): Payer: Medicare Other

## 2010-11-13 DIAGNOSIS — I4891 Unspecified atrial fibrillation: Secondary | ICD-10-CM

## 2010-11-13 DIAGNOSIS — Z7901 Long term (current) use of anticoagulants: Secondary | ICD-10-CM

## 2010-11-17 ENCOUNTER — Encounter: Payer: Self-pay | Admitting: Family Medicine

## 2010-11-20 NOTE — Letter (Signed)
Summary: [facility]  [facility]   Imported By: Kassie Mends 11/11/2010 09:16:31  _____________________________________________________________________  External Attachment:    Type:   Image     Comment:   External Document  Appended Document: [facility]    Clinical Lists Changes  Observations: Added new observation of PAST MED HX: HEMORRHOIDS (ICD-455.6) GASTRITIS (ICD-535.50) HIATAL HERNIA (ICD-553.3) GERD (ICD-530.81) ATRIAL ARRHYTHMIAS (ICD-427.89) MI (ICD-410.90),  (1997 and 2004) RENAL INSUFFICIENCY, CHRONIC (ICD-585.9) HYPOPOTASSEMIA (ICD-276.8) HYPOTHYROIDISM (ICD-244.9) ATRIAL FIBRILLATION ON COUMADIN (ICD-427.31) GOUT (ICD-274.9) B12 DEFICIENCY (ICD-266.2) ANEMIA, MILD (ICD-285.9) BENIGN PROSTATIC HYPERTROPHY, HX OF (ICD-V13.8) RENAL CALCULUS, RECURRENT LITHOTRIPSY (ICD-592.0) HYPERTENSION (ICD-401.9) DIVERTICULAR DISEASE VIA ACBE (ICD-562.10) HYPERLIPIDEMIA (ICD-272.4) PVD LE'S (ICD-443.9) DYSPNEA, CHRONIC (ICD-786.05) DM (ICD-250.00)- goal A1c  ~8 due to concern for hypoglycemia  1. Coronary artery disease status post remote diaphragmatic wall     infarction and status post drug-eluting stent to the right coronary     artery in 2004 for in-stent restenosis, now stable. 2. Good left ventricular function. 3. Paroxysmal atrial fibrillation treated with rate control     medications and Coumadin. 4. Hypertension. 5. Diabetes. 6. Hyperlipidemia. 7. Peripheral vascular disease status post bilateral renal stenting. 8. Renal insufficiency with a creatinine in the range of 2.   MMSE 05/2010: 28/30 (1/3 on recall) Chronic rhinitis per Dr. Annalee Genta 10/2010 (11/13/2010 23:01)       Past History:  Past Medical History: HEMORRHOIDS (ICD-455.6) GASTRITIS (ICD-535.50) HIATAL HERNIA (ICD-553.3) GERD (ICD-530.81) ATRIAL ARRHYTHMIAS (ICD-427.89) MI (ICD-410.90),  (1997 and 2004) RENAL  INSUFFICIENCY, CHRONIC (ICD-585.9) HYPOPOTASSEMIA (ICD-276.8) HYPOTHYROIDISM (ICD-244.9) ATRIAL FIBRILLATION ON COUMADIN (ICD-427.31) GOUT (ICD-274.9) B12 DEFICIENCY (ICD-266.2) ANEMIA, MILD (ICD-285.9) BENIGN PROSTATIC HYPERTROPHY, HX OF (ICD-V13.8) RENAL CALCULUS, RECURRENT LITHOTRIPSY (ICD-592.0) HYPERTENSION (ICD-401.9) DIVERTICULAR DISEASE VIA ACBE (ICD-562.10) HYPERLIPIDEMIA (ICD-272.4) PVD LE'S (ICD-443.9) DYSPNEA, CHRONIC (ICD-786.05) DM (ICD-250.00)- goal A1c  ~8 due to concern for hypoglycemia  1. Coronary artery disease status post remote diaphragmatic wall     infarction and status post drug-eluting stent to the right coronary     artery in 2004 for in-stent restenosis, now stable. 2. Good left ventricular function. 3. Paroxysmal atrial fibrillation treated with rate control     medications and Coumadin. 4. Hypertension. 5. Diabetes. 6. Hyperlipidemia. 7. Peripheral vascular disease status post bilateral renal stenting. 8. Renal insufficiency with a creatinine in the range of 2.   MMSE 05/2010: 28/30 (1/3 on recall) Chronic rhinitis per Dr. Annalee Genta 10/2010

## 2010-11-20 NOTE — Medication Information (Signed)
Summary: [facility]  Anticoagulant Therapy  Managed by: Windell Hummingbird, RN Referring MD: Charlies Constable MD PCP: [name], MD Supervising MD: Johney Frame MD, Fayrene Fearing Indication 1: Atrial Fibrillation (ICD-427.31) Lab Used: [facility] [place] Site: [address] INR POC 2.4 INR RANGE 2 - 3  Dietary changes: no    Health status changes: no    Bleeding/hemorrhagic complications: no    Recent/future hospitalizations: no    Any changes in medication regimen? no    Recent/future dental: no  Any missed doses?: no       Is patient compliant with meds? yes       Allergies: 1)  ! Codeine Sulfate (Codeine Sulfate)  Anticoagulation Management History:      The patient is taking warfarin and comes in today for a routine follow up visit.  Positive risk factors for bleeding include an age of 28 years or older and presence of serious comorbidities.  The bleeding index is 'intermediate risk'.  Positive CHADS2 values include History of HTN, Age > 48 years old, and History of Diabetes.  The start date was 08/19/2006.  His last INR was 1.9.  Anticoagulation responsible provider: Kemo Spruce MD, Fayrene Fearing.  INR POC: 2.4.  Cuvette Lot#: [id number].  Exp: 11/2011.    Anticoagulation Management Assessment/Plan:      The patient's current anticoagulation dose is Warfarin sodium 5 mg tabs: Use as directed by Anticoagulation Clinic.  The target INR is 2.0-3.0.  The next INR is due 12/04/2010.  Anticoagulation instructions were given to patient.  Results were reviewed/authorized by Windell Hummingbird, RN.  He was notified by Windell Hummingbird, RN.         Prior Anticoagulation Instructions: INR 1.9 (goal 2-3)  Continue taking 1 tablet everyday except take 1 1/2 tablets on Sundays. Next INR check: Thursday, Feb. 9th at 11:45AM.  Current Anticoagulation Instructions: INR 2.4 Continue taking 1 tablet everyday, except take 1 1/2 on Sundays. Recheck in 3 weeks.

## 2010-11-24 DIAGNOSIS — I498 Other specified cardiac arrhythmias: Secondary | ICD-10-CM

## 2010-11-26 NOTE — Letter (Signed)
Summary: [facility]  [facility]   Imported By: Kassie Mends 11/21/2010 09:48:55  _____________________________________________________________________  External Attachment:    Type:   Image     Comment:   External Document  Appended Document: [facility]    Clinical Lists Changes  Observations: Added new observation of PAST MED HX: HEMORRHOIDS (ICD-455.6) GASTRITIS (ICD-535.50) HIATAL HERNIA (ICD-553.3) GERD (ICD-530.81) ATRIAL ARRHYTHMIAS (ICD-427.89) MI (ICD-410.90),  (1997 and 2004) RENAL INSUFFICIENCY, CHRONIC (ICD-585.9) HYPOPOTASSEMIA (ICD-276.8) HYPOTHYROIDISM (ICD-244.9) ATRIAL FIBRILLATION ON COUMADIN (ICD-427.31) GOUT (ICD-274.9) B12 DEFICIENCY (ICD-266.2) ANEMIA, MILD (ICD-285.9) BENIGN PROSTATIC HYPERTROPHY, HX OF (ICD-V13.8) RENAL CALCULUS, RECURRENT LITHOTRIPSY (ICD-592.0) HYPERTENSION (ICD-401.9) DIVERTICULAR DISEASE VIA ACBE (ICD-562.10) HYPERLIPIDEMIA (ICD-272.4) PVD LE'S (ICD-443.9) DYSPNEA, CHRONIC (ICD-786.05) DM (ICD-250.00)- goal A1c  ~8 due to concern for hypoglycemia  1. Coronary artery disease status post remote diaphragmatic wall     infarction and status post drug-eluting stent to the right coronary     artery in 2004 for in-stent restenosis, now stable. 2. Good left ventricular function. 3. Paroxysmal atrial fibrillation treated with rate control     medications and Coumadin. 4. Hypertension. 5. Diabetes. 6. Hyperlipidemia. 7. Peripheral vascular disease status post bilateral renal stenting. 8. Renal insufficiency with a creatinine in the range of 2.   MMSE 05/2010: 28/30 (1/3 on recall) Chronic rhinitis per Dr. Annalee Genta 10/2010 Lewy body dementia vs AD with parkinsonian features on AVID trial per Dr. Terrace Arabia 2012  (11/21/2010 9:51)       Past History:  Past Medical History: HEMORRHOIDS (ICD-455.6) GASTRITIS (ICD-535.50) HIATAL HERNIA (ICD-553.3) GERD  (ICD-530.81) ATRIAL ARRHYTHMIAS (ICD-427.89) MI (ICD-410.90),  (1997 and 2004) RENAL INSUFFICIENCY, CHRONIC (ICD-585.9) HYPOPOTASSEMIA (ICD-276.8) HYPOTHYROIDISM (ICD-244.9) ATRIAL FIBRILLATION ON COUMADIN (ICD-427.31) GOUT (ICD-274.9) B12 DEFICIENCY (ICD-266.2) ANEMIA, MILD (ICD-285.9) BENIGN PROSTATIC HYPERTROPHY, HX OF (ICD-V13.8) RENAL CALCULUS, RECURRENT LITHOTRIPSY (ICD-592.0) HYPERTENSION (ICD-401.9) DIVERTICULAR DISEASE VIA ACBE (ICD-562.10) HYPERLIPIDEMIA (ICD-272.4) PVD LE'S (ICD-443.9) DYSPNEA, CHRONIC (ICD-786.05) DM (ICD-250.00)- goal A1c  ~8 due to concern for hypoglycemia  1. Coronary artery disease status post remote diaphragmatic wall     infarction and status post drug-eluting stent to the right coronary     artery in 2004 for in-stent restenosis, now stable. 2. Good left ventricular function. 3. Paroxysmal atrial fibrillation treated with rate control     medications and Coumadin. 4. Hypertension. 5. Diabetes. 6. Hyperlipidemia. 7. Peripheral vascular disease status post bilateral renal stenting. 8. Renal insufficiency with a creatinine in the range of 2.   MMSE 05/2010: 28/30 (1/3 on recall) Chronic rhinitis per Dr. Annalee Genta 10/2010 Lewy body dementia vs AD with parkinsonian features on AVID trial per Dr. Terrace Arabia 2012

## 2010-12-01 ENCOUNTER — Other Ambulatory Visit (INDEPENDENT_AMBULATORY_CARE_PROVIDER_SITE_OTHER): Payer: Medicare Other

## 2010-12-01 ENCOUNTER — Other Ambulatory Visit: Payer: Self-pay | Admitting: Family Medicine

## 2010-12-01 ENCOUNTER — Encounter (INDEPENDENT_AMBULATORY_CARE_PROVIDER_SITE_OTHER): Payer: Self-pay | Admitting: *Deleted

## 2010-12-01 DIAGNOSIS — E039 Hypothyroidism, unspecified: Secondary | ICD-10-CM

## 2010-12-01 LAB — TSH: TSH: 4.37 u[IU]/mL (ref 0.35–5.50)

## 2010-12-04 ENCOUNTER — Encounter: Payer: Self-pay | Admitting: Internal Medicine

## 2010-12-04 ENCOUNTER — Encounter (INDEPENDENT_AMBULATORY_CARE_PROVIDER_SITE_OTHER): Payer: Medicare Other

## 2010-12-04 DIAGNOSIS — Z7901 Long term (current) use of anticoagulants: Secondary | ICD-10-CM

## 2010-12-04 DIAGNOSIS — I4891 Unspecified atrial fibrillation: Secondary | ICD-10-CM

## 2010-12-04 LAB — CONVERTED CEMR LAB: POC INR: 3

## 2010-12-11 NOTE — Medication Information (Signed)
Summary: rov/pc  Anticoagulant Therapy  Managed by: Cloyde Reams, RN, BSN Referring MD: Charlies Constable MD PCP: [name], MD Supervising MD: Tenny Craw MD, Gunnar Fusi Indication 1: Atrial Fibrillation (ICD-427.31) Lab Used: [facility] [place] Site: [address] INR POC 3.0 INR RANGE 2 - 3  Dietary changes: yes       Details: Decr intake of vit K, non recently  Health status changes: no    Bleeding/hemorrhagic complications: no    Recent/future hospitalizations: no    Any changes in medication regimen? yes       Details: added Carbidopa-Levodopa 25-100mg  tid  Recent/future dental: no  Any missed doses?: no       Is patient compliant with meds? yes       Allergies: 1)  ! Codeine Sulfate (Codeine Sulfate)  Anticoagulation Management History:      The patient is taking warfarin and comes in today for a routine follow up visit.  Positive risk factors for bleeding include an age of 75 years or older and presence of serious comorbidities.  The bleeding index is 'intermediate risk'.  Positive CHADS2 values include History of HTN, Age > 27 years old, and History of Diabetes.  The start date was 08/19/2006.  His last INR was 1.9.  Anticoagulation responsible provider: Tenny Craw MD, Gunnar Fusi.  INR POC: 3.0.  Cuvette Lot#: [id number].  Exp: 10/2011.    Anticoagulation Management Assessment/Plan:      The patient's current anticoagulation dose is Warfarin sodium 5 mg tabs: Use as directed by Anticoagulation Clinic.  The target INR is 2.0-3.0.  The next INR is due 01/01/2011.  Anticoagulation instructions were given to patient.  Results were reviewed/authorized by Cloyde Reams, RN, BSN.  He was notified by Cloyde Reams RN.         Prior Anticoagulation Instructions: INR 2.4 Continue taking 1 tablet everyday, except take 1 1/2 on Sundays. Recheck in 3 weeks.  Current Anticoagulation Instructions: INR 3.0  Take 1/2 tablet today, then resume same dosage 1 tablet daily except 1.5  tablets on Sundays.  Recheck in 4 weeks.

## 2010-12-17 ENCOUNTER — Other Ambulatory Visit: Payer: Self-pay | Admitting: Family Medicine

## 2010-12-17 ENCOUNTER — Other Ambulatory Visit (INDEPENDENT_AMBULATORY_CARE_PROVIDER_SITE_OTHER): Payer: Medicare Other

## 2010-12-17 ENCOUNTER — Encounter: Payer: Self-pay | Admitting: *Deleted

## 2010-12-17 DIAGNOSIS — Z79899 Other long term (current) drug therapy: Secondary | ICD-10-CM

## 2010-12-17 DIAGNOSIS — E119 Type 2 diabetes mellitus without complications: Secondary | ICD-10-CM

## 2010-12-20 LAB — COMPREHENSIVE METABOLIC PANEL
AST: 24 U/L (ref 0–37)
CO2: 25 mEq/L (ref 19–32)
Calcium: 8.7 mg/dL (ref 8.4–10.5)
Creatinine, Ser: 1.43 mg/dL (ref 0.4–1.5)
GFR calc Af Amer: 58 mL/min — ABNORMAL LOW (ref >60)
GFR calc non Af Amer: 48 mL/min — ABNORMAL LOW (ref >60)

## 2010-12-20 LAB — CBC
MCHC: 34.6 g/dL (ref 30.0–36.0)
MCV: 97.3 fL (ref 78.0–100.0)
RBC: 3.5 MIL/uL — ABNORMAL LOW (ref 4.22–5.81)
RDW: 13.1 % (ref 11.5–15.5)

## 2010-12-20 LAB — APTT: aPTT: 33 seconds (ref 24–37)

## 2010-12-20 LAB — PROTIME-INR: Prothrombin Time: 22.5 seconds — ABNORMAL HIGH (ref 11.6–15.2)

## 2010-12-21 LAB — GLUCOSE, CAPILLARY
Glucose-Capillary: 107 mg/dL — ABNORMAL HIGH (ref 70–99)
Glucose-Capillary: 113 mg/dL — ABNORMAL HIGH (ref 70–99)
Glucose-Capillary: 115 mg/dL — ABNORMAL HIGH (ref 70–99)
Glucose-Capillary: 196 mg/dL — ABNORMAL HIGH (ref 70–99)
Glucose-Capillary: 198 mg/dL — ABNORMAL HIGH (ref 70–99)
Glucose-Capillary: 229 mg/dL — ABNORMAL HIGH (ref 70–99)
Glucose-Capillary: 65 mg/dL — ABNORMAL LOW (ref 70–99)
Glucose-Capillary: 72 mg/dL (ref 70–99)
Glucose-Capillary: 76 mg/dL (ref 70–99)
Glucose-Capillary: 123 mg/dL — ABNORMAL HIGH (ref 70–99)
Glucose-Capillary: 129 mg/dL — ABNORMAL HIGH (ref 70–99)
Glucose-Capillary: 133 mg/dL — ABNORMAL HIGH (ref 70–99)
Glucose-Capillary: 135 mg/dL — ABNORMAL HIGH (ref 70–99)
Glucose-Capillary: 140 mg/dL — ABNORMAL HIGH (ref 70–99)
Glucose-Capillary: 143 mg/dL — ABNORMAL HIGH (ref 70–99)
Glucose-Capillary: 166 mg/dL — ABNORMAL HIGH (ref 70–99)
Glucose-Capillary: 175 mg/dL — ABNORMAL HIGH (ref 70–99)
Glucose-Capillary: 188 mg/dL — ABNORMAL HIGH (ref 70–99)
Glucose-Capillary: 192 mg/dL — ABNORMAL HIGH (ref 70–99)
Glucose-Capillary: 194 mg/dL — ABNORMAL HIGH (ref 70–99)
Glucose-Capillary: 199 mg/dL — ABNORMAL HIGH (ref 70–99)
Glucose-Capillary: 203 mg/dL — ABNORMAL HIGH (ref 70–99)
Glucose-Capillary: 205 mg/dL — ABNORMAL HIGH (ref 70–99)
Glucose-Capillary: 227 mg/dL — ABNORMAL HIGH (ref 70–99)

## 2010-12-21 LAB — CK TOTAL AND CKMB (NOT AT ARMC): CK, MB: 2.9 ng/mL (ref 0.3–4.0)

## 2010-12-21 LAB — COMPREHENSIVE METABOLIC PANEL
ALT: 26 U/L (ref 0–53)
ALT: 33 U/L (ref 0–53)
ALT: 153 U/L — ABNORMAL HIGH (ref 0–53)
ALT: 63 U/L — ABNORMAL HIGH (ref 0–53)
AST: 35 U/L (ref 0–37)
AST: 236 U/L — ABNORMAL HIGH (ref 0–37)
AST: 30 U/L (ref 0–37)
AST: 51 U/L — ABNORMAL HIGH (ref 0–37)
Albumin: 2.4 g/dL — ABNORMAL LOW (ref 3.5–5.2)
Albumin: 2.7 g/dL — ABNORMAL LOW (ref 3.5–5.2)
Albumin: 3.1 g/dL — ABNORMAL LOW (ref 3.5–5.2)
Albumin: 2.1 g/dL — ABNORMAL LOW (ref 3.5–5.2)
Albumin: 2.1 g/dL — ABNORMAL LOW (ref 3.5–5.2)
Alkaline Phosphatase: 72 U/L (ref 39–117)
Alkaline Phosphatase: 112 U/L (ref 39–117)
BUN: 77 mg/dL — ABNORMAL HIGH (ref 6–23)
BUN: 16 mg/dL (ref 6–23)
BUN: 21 mg/dL (ref 6–23)
CO2: 21 mEq/L (ref 19–32)
CO2: 25 mEq/L (ref 19–32)
Calcium: 8.8 mg/dL (ref 8.4–10.5)
Calcium: 8.2 mg/dL — ABNORMAL LOW (ref 8.4–10.5)
Calcium: 8.3 mg/dL — ABNORMAL LOW (ref 8.4–10.5)
Chloride: 111 mEq/L (ref 96–112)
Chloride: 104 mEq/L (ref 96–112)
Chloride: 105 mEq/L (ref 96–112)
Creatinine, Ser: 2.63 mg/dL — ABNORMAL HIGH (ref 0.4–1.5)
Creatinine, Ser: 3.16 mg/dL — ABNORMAL HIGH (ref 0.4–1.5)
Creatinine, Ser: 1.24 mg/dL (ref 0.4–1.5)
Creatinine, Ser: 1.5 mg/dL (ref 0.4–1.5)
Creatinine, Ser: 1.53 mg/dL — ABNORMAL HIGH (ref 0.4–1.5)
GFR calc Af Amer: 29 mL/min — ABNORMAL LOW (ref >60)
GFR calc Af Amer: >60 mL/min (ref >60)
GFR calc Af Amer: >60 mL/min (ref >60)
GFR calc non Af Amer: 44 mL/min — ABNORMAL LOW (ref >60)
Glucose, Bld: 125 mg/dL — ABNORMAL HIGH (ref 70–99)
Glucose, Bld: 228 mg/dL — ABNORMAL HIGH (ref 70–99)
Potassium: 4.1 mEq/L (ref 3.5–5.1)
Potassium: 3.2 mEq/L — ABNORMAL LOW (ref 3.5–5.1)
Sodium: 137 mEq/L (ref 135–145)
Sodium: 137 mEq/L (ref 135–145)
Sodium: 137 mEq/L (ref 135–145)
Sodium: 138 mEq/L (ref 135–145)
Total Bilirubin: 0.7 mg/dL (ref 0.3–1.2)
Total Bilirubin: 0.9 mg/dL (ref 0.3–1.2)
Total Bilirubin: 1.5 mg/dL — ABNORMAL HIGH (ref 0.3–1.2)
Total Bilirubin: 4.2 mg/dL — ABNORMAL HIGH (ref 0.3–1.2)
Total Protein: 6.5 g/dL (ref 6.0–8.3)
Total Protein: 7 g/dL (ref 6.0–8.3)
Total Protein: 8.1 g/dL (ref 6.0–8.3)
Total Protein: 6 g/dL (ref 6.0–8.3)
Total Protein: 6.3 g/dL (ref 6.0–8.3)

## 2010-12-21 LAB — DIFFERENTIAL
Basophils Absolute: 0.9 10*3/uL — ABNORMAL HIGH (ref 0.0–0.1)
Eosinophils Absolute: 0.2 10*3/uL (ref 0.0–0.7)
Lymphocytes Relative: 12 % (ref 12–46)
Lymphocytes Relative: 17 % (ref 12–46)
Lymphs Abs: 1.5 10*3/uL (ref 0.7–4.0)
Monocytes Absolute: 1.1 10*3/uL — ABNORMAL HIGH (ref 0.1–1.0)
Monocytes Relative: 13 % — ABNORMAL HIGH (ref 3–12)
Monocytes Relative: 10 % (ref 3–12)
Neutro Abs: 5.5 10*3/uL (ref 1.7–7.7)
Neutro Abs: 6.1 10*3/uL (ref 1.7–7.7)
Neutrophils Relative %: 71 % (ref 43–77)

## 2010-12-21 LAB — CBC
HCT: 38.3 % — ABNORMAL LOW (ref 39.0–52.0)
HCT: 28.7 % — ABNORMAL LOW (ref 39.0–52.0)
HCT: 29 % — ABNORMAL LOW (ref 39.0–52.0)
Hemoglobin: 11.7 g/dL — ABNORMAL LOW (ref 13.0–17.0)
Hemoglobin: 10.5 g/dL — ABNORMAL LOW (ref 13.0–17.0)
MCHC: 33.7 g/dL (ref 30.0–36.0)
MCHC: 34.1 g/dL (ref 30.0–36.0)
MCHC: 34 g/dL (ref 30.0–36.0)
MCHC: 34.8 g/dL (ref 30.0–36.0)
MCV: 97.6 fL (ref 78.0–100.0)
MCV: 97.6 fL (ref 78.0–100.0)
MCV: 97.8 fL (ref 78.0–100.0)
Platelets: 234 10*3/uL (ref 150–400)
Platelets: 273 10*3/uL (ref 150–400)
Platelets: 206 10*3/uL (ref 150–400)
RBC: 3.66 MIL/uL — ABNORMAL LOW (ref 4.22–5.81)
RBC: 2.97 MIL/uL — ABNORMAL LOW (ref 4.22–5.81)
RBC: 3.08 MIL/uL — ABNORMAL LOW (ref 4.22–5.81)
RDW: 12.6 % (ref 11.5–15.5)
RDW: 12.9 % (ref 11.5–15.5)
RDW: 13 % (ref 11.5–15.5)
RDW: 13.1 % (ref 11.5–15.5)
WBC: 7 10*3/uL (ref 4.0–10.5)
WBC: 9 10*3/uL (ref 4.0–10.5)

## 2010-12-21 LAB — BASIC METABOLIC PANEL
BUN: 12 mg/dL (ref 6–23)
BUN: 14 mg/dL (ref 6–23)
BUN: 9 mg/dL (ref 6–23)
CO2: 24 mEq/L (ref 19–32)
CO2: 26 mEq/L (ref 19–32)
Calcium: 8 mg/dL — ABNORMAL LOW (ref 8.4–10.5)
Chloride: 103 mEq/L (ref 96–112)
Chloride: 98 mEq/L (ref 96–112)
Creatinine, Ser: 1.15 mg/dL (ref 0.4–1.5)
Creatinine, Ser: 1.17 mg/dL (ref 0.4–1.5)
GFR calc Af Amer: >60 mL/min (ref >60)
GFR calc Af Amer: >60 mL/min (ref >60)
GFR calc non Af Amer: 57 mL/min — ABNORMAL LOW (ref >60)
GFR calc non Af Amer: >60 mL/min (ref >60)
Glucose, Bld: 224 mg/dL — ABNORMAL HIGH (ref 70–99)
Glucose, Bld: 252 mg/dL — ABNORMAL HIGH (ref 70–99)
Potassium: 3.5 mEq/L (ref 3.5–5.1)
Potassium: 4 mEq/L (ref 3.5–5.1)
Potassium: 4.1 mEq/L (ref 3.5–5.1)
Sodium: 134 mEq/L — ABNORMAL LOW (ref 135–145)
Sodium: 134 mEq/L — ABNORMAL LOW (ref 135–145)

## 2010-12-21 LAB — PROTIME-INR
INR: 3.12 — ABNORMAL HIGH (ref 0.00–1.49)
INR: 1.07 (ref 0.00–1.49)
Prothrombin Time: 23.7 seconds — ABNORMAL HIGH (ref 11.6–15.2)
Prothrombin Time: 31.9 seconds — ABNORMAL HIGH (ref 11.6–15.2)
Prothrombin Time: 13.7 seconds (ref 11.6–15.2)
Prothrombin Time: 13.8 seconds (ref 11.6–15.2)

## 2010-12-21 LAB — BRAIN NATRIURETIC PEPTIDE
Pro B Natriuretic peptide (BNP): 475 pg/mL — ABNORMAL HIGH (ref 0.0–100.0)
Pro B Natriuretic peptide (BNP): 607 pg/mL — ABNORMAL HIGH (ref 0.0–100.0)

## 2010-12-21 LAB — PHOSPHORUS: Phosphorus: 4.5 mg/dL (ref 2.3–4.6)

## 2010-12-21 LAB — POCT CARDIAC MARKERS: CKMB, poc: 5.5 ng/mL (ref 1.0–8.0)

## 2010-12-21 LAB — URINALYSIS, ROUTINE W REFLEX MICROSCOPIC
Glucose, UA: NEGATIVE mg/dL
Hgb urine dipstick: NEGATIVE
Ketones, ur: NEGATIVE mg/dL
Protein, ur: NEGATIVE mg/dL

## 2010-12-21 LAB — HEMOGLOBIN A1C: Mean Plasma Glucose: 214 mg/dL

## 2010-12-21 LAB — LIPASE, BLOOD: Lipase: 53 U/L (ref 11–59)

## 2010-12-21 LAB — MAGNESIUM: Magnesium: 2.6 mg/dL — ABNORMAL HIGH (ref 1.5–2.5)

## 2010-12-21 LAB — APTT: aPTT: 74 seconds — ABNORMAL HIGH (ref 24–37)

## 2010-12-21 LAB — ALBUMIN: Albumin: 2.1 g/dL — ABNORMAL LOW (ref 3.5–5.2)

## 2010-12-24 LAB — MAGNESIUM: Magnesium: 1.3 mg/dL — ABNORMAL LOW (ref 1.5–2.5)

## 2010-12-24 LAB — PREPARE FRESH FROZEN PLASMA

## 2010-12-24 LAB — GLUCOSE, CAPILLARY
Glucose-Capillary: 151 mg/dL — ABNORMAL HIGH (ref 70–99)
Glucose-Capillary: 170 mg/dL — ABNORMAL HIGH (ref 70–99)
Glucose-Capillary: 188 mg/dL — ABNORMAL HIGH (ref 70–99)
Glucose-Capillary: 195 mg/dL — ABNORMAL HIGH (ref 70–99)
Glucose-Capillary: 236 mg/dL — ABNORMAL HIGH (ref 70–99)
Glucose-Capillary: 262 mg/dL — ABNORMAL HIGH (ref 70–99)
Glucose-Capillary: 72 mg/dL (ref 70–99)
Glucose-Capillary: 74 mg/dL (ref 70–99)
Glucose-Capillary: 80 mg/dL (ref 70–99)

## 2010-12-24 LAB — COMPREHENSIVE METABOLIC PANEL
ALT: 18 U/L (ref 0–53)
Albumin: 3 g/dL — ABNORMAL LOW (ref 3.5–5.2)
Alkaline Phosphatase: 64 U/L (ref 39–117)
BUN: 10 mg/dL (ref 6–23)
BUN: 20 mg/dL (ref 6–23)
CO2: 22 mEq/L (ref 19–32)
Calcium: 8.8 mg/dL (ref 8.4–10.5)
Chloride: 110 mEq/L (ref 96–112)
Creatinine, Ser: 1.53 mg/dL — ABNORMAL HIGH (ref 0.4–1.5)
GFR calc non Af Amer: >60 mL/min (ref >60)
Glucose, Bld: 168 mg/dL — ABNORMAL HIGH (ref 70–99)
Potassium: 3 mEq/L — ABNORMAL LOW (ref 3.5–5.1)
Sodium: 138 mEq/L (ref 135–145)
Total Bilirubin: 0.6 mg/dL (ref 0.3–1.2)
Total Protein: 7.6 g/dL (ref 6.0–8.3)

## 2010-12-24 LAB — BASIC METABOLIC PANEL
BUN: 11 mg/dL (ref 6–23)
BUN: 11 mg/dL (ref 6–23)
BUN: 14 mg/dL (ref 6–23)
CO2: 21 mEq/L (ref 19–32)
CO2: 21 mEq/L (ref 19–32)
CO2: 22 mEq/L (ref 19–32)
Calcium: 7.9 mg/dL — ABNORMAL LOW (ref 8.4–10.5)
Calcium: 8.1 mg/dL — ABNORMAL LOW (ref 8.4–10.5)
Chloride: 108 mEq/L (ref 96–112)
Chloride: 109 mEq/L (ref 96–112)
Chloride: 112 mEq/L (ref 96–112)
Creatinine, Ser: 1.18 mg/dL (ref 0.4–1.5)
Creatinine, Ser: 1.24 mg/dL (ref 0.4–1.5)
Creatinine, Ser: 1.3 mg/dL (ref 0.4–1.5)
GFR calc Af Amer: >60 mL/min (ref >60)
GFR calc Af Amer: >60 mL/min (ref >60)
GFR calc non Af Amer: 54 mL/min — ABNORMAL LOW (ref >60)
Glucose, Bld: 129 mg/dL — ABNORMAL HIGH (ref 70–99)
Glucose, Bld: 181 mg/dL — ABNORMAL HIGH (ref 70–99)
Glucose, Bld: 224 mg/dL — ABNORMAL HIGH (ref 70–99)
Potassium: 2.8 mEq/L — ABNORMAL LOW (ref 3.5–5.1)
Sodium: 135 mEq/L (ref 135–145)
Sodium: 140 mEq/L (ref 135–145)

## 2010-12-24 LAB — CBC
HCT: 29.8 % — ABNORMAL LOW (ref 39.0–52.0)
HCT: 30 % — ABNORMAL LOW (ref 39.0–52.0)
HCT: 30.2 % — ABNORMAL LOW (ref 39.0–52.0)
HCT: 37.5 % — ABNORMAL LOW (ref 39.0–52.0)
Hemoglobin: 10.3 g/dL — ABNORMAL LOW (ref 13.0–17.0)
Hemoglobin: 10.5 g/dL — ABNORMAL LOW (ref 13.0–17.0)
Hemoglobin: 10.6 g/dL — ABNORMAL LOW (ref 13.0–17.0)
MCHC: 34.7 g/dL (ref 30.0–36.0)
MCHC: 35.1 g/dL (ref 30.0–36.0)
MCHC: 35.2 g/dL (ref 30.0–36.0)
MCV: 93.7 fL (ref 78.0–100.0)
MCV: 94.5 fL (ref 78.0–100.0)
MCV: 94.7 fL (ref 78.0–100.0)
MCV: 94.9 fL (ref 78.0–100.0)
Platelets: 270 10*3/uL (ref 150–400)
Platelets: 308 10*3/uL (ref 150–400)
Platelets: 431 10*3/uL — ABNORMAL HIGH (ref 150–400)
RBC: 3.16 MIL/uL — ABNORMAL LOW (ref 4.22–5.81)
RBC: 3.22 MIL/uL — ABNORMAL LOW (ref 4.22–5.81)
RBC: 3.3 MIL/uL — ABNORMAL LOW (ref 4.22–5.81)
RDW: 12.8 % (ref 11.5–15.5)
RDW: 12.9 % (ref 11.5–15.5)
RDW: 13.1 % (ref 11.5–15.5)
RDW: 13.3 % (ref 11.5–15.5)
WBC: 8.6 10*3/uL (ref 4.0–10.5)

## 2010-12-24 LAB — DIFFERENTIAL
Basophils Absolute: 0.1 10*3/uL (ref 0.0–0.1)
Lymphocytes Relative: 15 % (ref 12–46)
Monocytes Absolute: 0.9 10*3/uL (ref 0.1–1.0)
Monocytes Relative: 7 % (ref 3–12)
Neutro Abs: 9.9 10*3/uL — ABNORMAL HIGH (ref 1.7–7.7)

## 2010-12-24 LAB — HEPATIC FUNCTION PANEL
ALT: 18 U/L (ref 0–53)
Bilirubin, Direct: 0.2 mg/dL (ref 0.0–0.3)
Indirect Bilirubin: 0.4 mg/dL (ref 0.3–0.9)
Total Protein: 6 g/dL (ref 6.0–8.3)

## 2010-12-24 LAB — HEMOGLOBIN A1C: Hgb A1c MFr Bld: 7.1 % — ABNORMAL HIGH (ref 4.6–6.1)

## 2010-12-24 LAB — PROTIME-INR
INR: 1.1 (ref 0.00–1.49)
INR: 1.57 — ABNORMAL HIGH (ref 0.00–1.49)
INR: 4 — ABNORMAL HIGH (ref 0.00–1.49)
Prothrombin Time: 18.6 seconds — ABNORMAL HIGH (ref 11.6–15.2)

## 2010-12-24 LAB — CULTURE, BLOOD (ROUTINE X 2)

## 2010-12-24 LAB — APTT: aPTT: 55 seconds — ABNORMAL HIGH (ref 24–37)

## 2010-12-24 LAB — WOUND CULTURE

## 2010-12-26 ENCOUNTER — Other Ambulatory Visit: Payer: Self-pay

## 2010-12-31 ENCOUNTER — Encounter: Payer: Medicare Other | Admitting: *Deleted

## 2011-01-02 ENCOUNTER — Ambulatory Visit (INDEPENDENT_AMBULATORY_CARE_PROVIDER_SITE_OTHER): Payer: Medicare Other | Admitting: Family Medicine

## 2011-01-02 ENCOUNTER — Encounter: Payer: Self-pay | Admitting: Family Medicine

## 2011-01-02 VITALS — BP 134/58 | HR 84 | Temp 98.3°F | Ht 69.0 in | Wt 213.1 lb

## 2011-01-02 DIAGNOSIS — G20 Parkinson's disease: Secondary | ICD-10-CM

## 2011-01-02 DIAGNOSIS — L821 Other seborrheic keratosis: Secondary | ICD-10-CM | POA: Insufficient documentation

## 2011-01-02 DIAGNOSIS — E119 Type 2 diabetes mellitus without complications: Secondary | ICD-10-CM

## 2011-01-02 NOTE — Patient Instructions (Signed)
Decrease your glyburide to 1 pill a day instead of two.  Keep taking your other regular meds.   Schedule a follow up lab appointment in:  About 3 months. Schedule a follow up appointment with Para March a few days after labs.  .  Take care.  Glad to see you today. Let me know if the spot on your back bothers you more.

## 2011-01-02 NOTE — Progress Notes (Signed)
Has had fu with Neuro re: tremor.  Dx'd with parkinson's symptoms and improved on Sinemet.  Dec in tremor- still more noticeable in L hand, but overall improved.  Still with some fatigue.   Diabetes:  Using medications without difficulties: yes Hypoglycemic episodes:rare Hyperglycemic episodes:see below Feet problems:no Blood Sugars averaging: ~200 in AM but this is with likely dawn effect contributing.  Working to stay with DM2 diet.   Meds, vitals, and allergies reviewed.   ROS: See HPI.  Otherwise negative.    GEN: nad, alert and oriented, tremor improved, but noted on L hand>R hand at rest HEENT: mucous membranes moist NECK: supple w/o LA CV: rrr. PULM: ctab, no inc wob ABD: soft, +bs EXT: no edema SKIN: no acute rash but mildly irritated SK, ~1cm on back- R scapula, noted  Diabetic foot exam: Normal inspection No skin breakdown No calluses  1+ DP pulses Normal sensation to light tough and monofilament Nails normal

## 2011-01-02 NOTE — Assessment & Plan Note (Addendum)
Dec glyburide as this likely isn't helping this control much in the long run and could contribute to hypoglycemia.  Goal A1c ~8 given concern for hypoglycemia and associated morbidity.  Return in 3 months for A1c.  He agrees with plan.

## 2011-01-02 NOTE — Assessment & Plan Note (Addendum)
Will monitor.  Minimally irritated. We can shave this off if it bothers him.  He'll let me know prn.

## 2011-01-05 ENCOUNTER — Encounter: Payer: Self-pay | Admitting: Family Medicine

## 2011-01-05 NOTE — Assessment & Plan Note (Signed)
Tremor is improved.  I appreciate Neuro help.

## 2011-01-06 ENCOUNTER — Ambulatory Visit (INDEPENDENT_AMBULATORY_CARE_PROVIDER_SITE_OTHER): Payer: Medicare Other | Admitting: *Deleted

## 2011-01-06 DIAGNOSIS — I498 Other specified cardiac arrhythmias: Secondary | ICD-10-CM

## 2011-01-06 DIAGNOSIS — Z7901 Long term (current) use of anticoagulants: Secondary | ICD-10-CM

## 2011-01-06 LAB — POCT INR: INR: 2.4

## 2011-01-06 NOTE — Patient Instructions (Signed)
INR 2.4  Continue same dose of 1 tablet every day except 1 1/2 tablets on Sunday.  Recheck INR in 4 weeks.

## 2011-02-03 ENCOUNTER — Ambulatory Visit (INDEPENDENT_AMBULATORY_CARE_PROVIDER_SITE_OTHER): Payer: Medicare Other | Admitting: *Deleted

## 2011-02-03 DIAGNOSIS — I498 Other specified cardiac arrhythmias: Secondary | ICD-10-CM

## 2011-02-03 LAB — POCT INR: INR: 3.1

## 2011-02-04 ENCOUNTER — Other Ambulatory Visit: Payer: Self-pay | Admitting: Family Medicine

## 2011-02-17 NOTE — Op Note (Signed)
NAMENIHAAL, FRIESEN NO.:  [id number]   MEDICAL RECORD NO.:  [id number]          PATIENT TYPE:  OIB   LOCATION:  5008                         FACILITY:  [facility]   PHYSICIAN:  Mark C. Ophelia Charter, M.D.    DATE OF BIRTH:  1930/10/04   DATE OF PROCEDURE:  10/03/2007  DATE OF DISCHARGE:                               OPERATIVE REPORT   PREOPERATIVE DIAGNOSIS:  Left L4-5 herniated nucleus pulposus, with  migrated free fragment.   POSTOPERATIVE DIAGNOSIS:  Left L4-5 herniated nucleus pulposus, with  migrated free fragment.   PROCEDURE:  Left L4-5 hemilaminectomy, L4-5 microdiskectomy, removal of  free fragment.   SURGEON:  Mark C. Ophelia Charter, M.D.   ANESTHESIA:  GOT.   ESTIMATED BLOOD LOSS:  Minimal.   PROCEDURE:  After induction of general anesthesia, the patient was  placed on chest rolls, with extra pads underneath the shoulders due to  limitation of external rotation with arms at sides, elbows at 90, with  padding over the ulnar nerve.  Back was prepped with DuraPrep.  Area  squared with towels.  Betadine and Vi-Drape applied.  Spinal needle at  the L4-5 space based on easy bony landmarks palpation, cross-table  lateral x-ray.  Due to the patient's history of renal failure, decision  was made for subperiosteal dissection on the lamina.  Taylor retractor  was placed laterally.  X-ray returned which showed the spinal needle was  placed exactly over the free fragment, just above the disc space as  planned. and a left L4 hemilaminectomy was performed, performing a  foraminotomy.  In the gutter, chunks of ligament were removed and  overhanging bone spurs.  Dura was gently retracted, and the disc  herniated fragment was identified, encapsulated, and had numerous small  pieces of endplate and thin sheets of endplate bone stuck in the pocket.  These were removed and teased out with some chunks of nucleus material  also.  Once the sac was completely decompressed, passes were  made  through the disc.  Another x-ray was taken with a Penfield #4 stuck in  the L4-5 disc space. confirming that we were at the appropriate level  and had found the migrated fragment.  Bone was removed out at the level  of pedicle nerve root above, which was the one being compressed, which  was L4 nerve root, was free as it came underneath the pedicle and no  longer was compressed or displaced.  After irrigation with saline  solution, the operative field was dry, so small epidural veins were  coagulated with the bipolar cautery, insulated tip on the way in to the  floor of the canal.  The deep fascia was closed with 0 Vicryl, 2-0  Vicryl for the subcutaneous tissue, skin staple closure.  Marcaine  infiltration and postop dressing.  Instrument count and needle count was  correct.      Mark C. Ophelia Charter, M.D.  Electronically Signed     MCY/MEDQ  D:  10/03/2007  T:  10/04/2007  Job:  [id number]

## 2011-02-17 NOTE — Assessment & Plan Note (Signed)
[facility]                            CARDIOLOGY OFFICE NOTE   NAME:Justin Lane, Justin Lane                       MRN:          [id number]  DATE:08/04/2007                            DOB:          [birth year]    PRIMARY CARE PHYSICIAN:  Arta Silence, M.D.   CLINICAL HISTORY:  Mr. Talavera returns for followup and management of his  coronary heart disease and atrial fibrillation.  He has had a remote  diaphragmatic wall infarction and had a drug-eluting stent placed in the  right coronary artery in 2004 for in-stent restenosis.  He has good LV  function.  He has done quite well from the standpoint of his heart, with  no recent chest pain, shortness of breath, or palpitations.   PAST MEDICAL HISTORY:  1. Diabetes.  2. Hypertension.  3. Hyperlipidemia.  4. Peripheral vascular disease, status post bilateral renal stenting.  5. Reflux.  6. Hiatal hernia.   CURRENT MEDICATIONS:  Aspirin, metformin, Lipitor which was increased  recently by Dr. Hetty Ely, glyburide, metoprolol, Avodart, digoxin,  levothyroxine, Januvia, Plavix, iron, Coumadin, Prilosec, and  lisinopril.   PHYSICAL EXAMINATION:  VITAL SIGNS:  On examination today, blood  pressure 116/65, pulse 82 and regular.  NECK:  There was no venous distension.  Carotid pulses were full.  CHEST:  Clear without rales or rhonchi.  CARDIAC:  The cardiac rhythm was regular.  There were no murmurs, rubs,  or gallops.  ABDOMEN:  Soft.  Normal bowel sounds.  EXTREMITIES:  There was trace peripheral edema.   Electrocardiogram was normal.   IMPRESSION:  1. Coronary artery disease status post remote diaphragmatic wall      infarction and status post drug-eluting stent to the right coronary      artery in 2004 for in-stent restenosis, now stable.  2. Good left ventricular function.  3. Paroxysmal atrial fibrillation treated with rate control      medications and Coumadin.  4. Hypertension.  5. Diabetes.  6. Hyperlipidemia.  7. Peripheral vascular disease status post bilateral renal stenting.  8. Renal insufficiency with a creatinine in the range of 2.   RECOMMENDATIONS:  I think Mr. Beswick is doing well.  He recently had  laboratory studies by Dr. Hetty Ely.  We will plan to see him back in  followup in a year.     Bruce Elvera Lennox Juanda Chance, MD, [facility]  Electronically Signed    BRB/MedQ  DD: 08/04/2007  DT: 08/05/2007  Job #: [id number]

## 2011-02-17 NOTE — Consult Note (Signed)
NAMEJAVI, Justin Lane NO.:  [id number]   MEDICAL RECORD NO.:  [id number]          PATIENT TYPE:  OIB   LOCATION:  5040                         FACILITY:  [facility]   PHYSICIAN:  Garnetta Buddy, M.D.   DATE OF BIRTH:  May 22, 1930   DATE OF CONSULTATION:  09/21/2007  DATE OF DISCHARGE:                                 CONSULTATION   HISTORY OF PRESENT ILLNESS:  The patient is a white male that I was  initially asked to evaluate in the preoperative area. He was scheduled  for L4-L5 herniated disk surgery by Dr. Ophelia Charter. Preoperative labs  revealed an increase in creatinine to 4.4. His baseline creatinine is  1.6. He denies any decreased urine output, dysuria, nocturia, or  prostatism. He has a remote history of renal calculi. He has been using  greater than 6 Ibuprofen on a daily basis for the last 3 weeks adn he  has admitted to lower extremity swelling for the last 3 weeks with  increased dyspnea on exertion. He denies any syncope, pre-syncope,  nausea, vomiting, diarrhea, fever, or chills.   PAST MEDICAL HISTORY:  1. Hypertension.  2. Coronary artery disease status post stent in 1996.  3. History of myocardial infarction x3.  4. History of atrial fibrillation.  5. History of hyperlipidemia.  6. History of diabetes.  7. History of anemia.  8. History of hypothyroidism.  9. History of DJD.  10.History of gout.  11.History of benign prostatic hypertrophy.  12.History of PVD, status post bilateral iliac stents July 2005.   HOME MEDICATIONS:  1. Glucophage 1 gram in the morning and 2 grams in the evening.  2. Lasix 40 mg b.i.d.  3. Metoprolol 100 mg b.i.d.  4. Lisinopril 5 mg daily.  5. Glyburide 6 mg b.i.d.  6. Coumadin is held preoperatively.  7. Levothyroxine 25 mcg daily.  8. Digoxin 0.125 mg every other day.  9. Avodart 2.5 mg daily.  10.Prilosec 20 mg daily.  11.Aspirin 81 mg daily.   ALLERGIES:  CODEINE.   SOCIAL HISTORY:  Married. Quit tobacco in  1988. No alcohol. Three  children.   FAMILY HISTORY:  Father died at 78 of cancer. Mother died at 35 of  myocardial infarction.   REVIEW OF SYSTEMS:  GENERAL:  No fever, sweats, chills, malaise. HEENT:  Eyes:  No visual  complaints, loss of vision or diplopia. No hearing  loss, epistaxis, or sore throat. CARDIOVASCULAR:  No chest pain. Denies  angina. Positive for orthopnea x2 pillows.  Positive for paroxysmal  nocturnal dyspnea. Positive for ankle and leg swelling. RESPIRATORY:  No  exertional dyspnea, no cough, no sputum, no hemoptysis. ABDOMEN:  No  pain. Decreased appetite. Positive for weight loss, 50 pounds, last 1 to  2 years. History of colonoscopy, __________. GENITOURINARY:  History as  per HPI. MUSCULOSKELETAL:  As per HPI. DERMATOLOGIC:  No skin rash or  itching. NEUROLOGIC:  No history of stroke, seizures. No diplopia,  dysarthria, dysphagia, paresthesias.   PHYSICAL EXAMINATION:  GENERAL:  Alert. No distress. Resting comfortably  in bed.  VITAL SIGNS:  Blood pressure  129/60, pulse 94, temperature 96.6, sating  99%.  HEAD AND EYES:  Normocephalic and atraumatic. Pupils round, equal and  reactive. Funduscopic evaluation benign.  EARS, NOSE, MOUTH, THROAT:  External appearance was normal.  Nasal  mucosa clear. Oropharynx was clear.  NECK:  Supple. JVP not elevated. No neck stiffness, no thyromegaly, no  adenopathy.  CARDIOVASCULAR:  Regular rate and rhythm. 2/6 systolic murmur. No rubs,  no gallops.  RESPIRATORY:  Lungs fields were clear to auscultation. Decreased air  entry at the bases. Occasional fine inspiratory rales.  ABDOMEN:  Soft, nontender. Bowel sounds present and active. No organo-  splenomegaly.  GENITOURINARY:  Genital evaluation unremarkable.  EXTREMITIES:  2+ peripheral edema in the lower extremities. Pulses:  dorsalis pedis, posterior tibial 2+ throughout.   LABORATORY DATA:  WBC 9. Hemoglobin 13.2. Platelets 275,000. INR 1.8,  sodium 139, potassium  4.9, CO2 20, chloride 105, BUN 87, creatinine 4.4,  glucose 140. Calcium 10, albumin 3.8, AST 33, ALT 28, alkaline  phosphatase 67. Urinalysis negative.   Chest x-ray no acute findings.   EKG non-specific. ST segment changes. Normal sinus rhythm.   ASSESSMENT/PLAN:  1. Acute renal failure secondary to ace inhibitor in combination with      non-steroid anti-inflammatory drugs. Will check renal ultrasound,      urinalysis, microscopy. Continue to monitor renal function. Stop      Metformin, stop ace inhibitors.  2. Hypertension, volume. Will hold anti-hypertensive agents for blood      pressure less than 120.  3. Diabetes mellitus. Sliding scale coverage. Continue to follow Accu-      checks.  4. Atrial fibrillation. Lovenox therapy. Hold Coumadin for now.      Garnetta Buddy, M.D.  Electronically Signed     MWW/MEDQ  D:  09/21/2007  T:  09/22/2007  Job:  [id number]

## 2011-02-17 NOTE — Assessment & Plan Note (Signed)
[facility]                         GASTROENTEROLOGY OFFICE NOTE   NAME:Justin Lane, Justin Lane                       MRN:          [id number]  DATE:08/05/2007                            DOB:          [birth year]    PRIMARY CARE PHYSICIAN:  Arta Silence, M.D.   GI PROBLEM LIST:  1. FOTB positive December 2007. This was while on Coumadin with very      elevated INR. Colonoscopy February 2008 was normal except for      internal hemorrhoids.  2. GERD related dyspeptic symptoms. EGD March 2008 mild nonspecific      gastritis, CLO negative. Symptoms improved on once daily PPI.      Epigastric discomfort increased October 2008.  Labs show mildly      elevated nonspecific white count 12,000, normal liver tests.  Right      upper quadrant ultrasound showed gallbladder sludge, but no      gallstones and normal bile duct.   INTERVAL HISTORY:  I last saw this patient 10 days ago.  Since then the  intermittent epigastric pains have resolved.  He has had no problems  with them in at least 2-3 weeks now.  The workup is summarized above.   CURRENT MEDICATIONS:  1. Aspirin.  2. Metformin.  3. Lipitor.  4. Glyburide.  5. Metoprolol.  6. Avodart.  7. Digoxin.  8. Levothyroxine.  9. Januvia.  10.Pentoxifylline.  11.Iron pill.  12.Multivitamin.  13.B12 shot.  14.Coumadin.  15.Prilosec.  16.Lasix.  17.Lisinopril.   PHYSICAL EXAMINATION:  VITAL SIGNS:  Weight 207 pounds which is down 2  pounds since his last visit.  Blood pressure 106/58, pulse 84.  CONSTITUTIONAL:  Generally well-appearing.  ABDOMEN:  Soft, nontender, and nondistended.  Normal bowel sounds.   ASSESSMENT:  A 77 year old man with resolved intermittent epigastric  discomfort.  He does have sludge in his gallbladder, but liver tests  have been normal and his symptoms have really resolved.  He feels quite  well.  I see no reason for further workup at this time.  I will,  however, set him up to  see me again in two months' time just to check in  and see how he is doing.  He knows to call if he has any significant  problems prior to then.     Rachael Fee, MD  Electronically Signed    DPJ/MedQ  DD: 08/05/2007  DT: 08/06/2007  Job #: [id number]   cc:   Arta Silence, MD

## 2011-02-17 NOTE — Assessment & Plan Note (Signed)
[facility]                               LIPID CLINIC NOTE   NAME:[name], [name]                       MRN:          [id number]  DATE:10/05/2007                            DOB:          1930-01-03    DATE OF TELEPHONE DOCUMENTATION:  October 05, 2007.   TIME OF TELEPHONE CALL TO PATIENT:  7:15 p.m.   Justin Lane was discharged from the [facility] yesterday  after a laminectomy with Dr. Ophelia Charter.  I was called about the patient by  the [facility] nurse earlier today because they had orders to  draw a protime, but the patient did not understand that he had been  given Coumadin.  Justin Lane' Home Health nurse called back again later  to say that they were unable to obtain a protime by a venipuncture,  which was necessitated because the patient was on Lovenox 80 mg subcu  once daily as a bridge to therapeutic anticoagulation.  I called to Dr.  Marlene Bast office to verify that it was intended for Justin Lane to resume  his Warfarin therapy.  I received a call back from his nurse this  evening.  It was intended for Justin Lane to do so until I contacted him  this evening at 7:10 p.m.  I have instructed Justin Lane to resume his  normal dosing of Coumadin, he states he has had no bleeding, he will  continue his Lovenox, he is pushing fluids as per the direction of the  Home Health nurse, who was unable to draw him because of dehydration.  He understands that with any shortness of breath, chest pain, weakness,  fatigue, or other problems, he is to contact the on-call service or head  to the emergency room immediately.  He is having no swelling of his  lower extremities and no acute shortness of breath.  He will take his  Coumadin this evening, and we will await a PT-INR from [place] if not  tomorrow, Thursday, the first of January, then on the 2nd.  All  questions have been answered.  Justin Lane will call with questions or  problems in the  meantime and resume his current dose in the meantime.      Shelby Dubin, PharmD, BCPS, CPP  Electronically Signed      Everardo Beals Juanda Chance, MD, [facility]  Electronically Signed   MP/MedQ  DD: 10/05/2007  DT: 10/05/2007  Job #: [id number]

## 2011-02-17 NOTE — Assessment & Plan Note (Signed)
[facility]                         GASTROENTEROLOGY OFFICE NOTE   NAME:Justin Lane, Justin Lane                       MRN:          [id number]  DATE:07/26/2007                            DOB:          1930-07-06    GI PROBLEM LIST:  1. FOTB positive December 2007. This was while on Coumadin with very      elevated INR. Colonoscopy February 2008 was normal except for      internal hemorrhoids.  2. GERD related dyspeptic symptoms. EGD March 2008 mild nonspecific      gastritis, CLO negative. Symptoms improved on once daily PPI.   INTERVAL HISTORY:  I last saw Justin Lane about 6 months ago.  Since  then, he has had intermittent epigastric pains.  Several minor bouts  where the pain would last for about an hour.  He had 1 bout about a  month ago that lasted for 12 hours or so.  He had some nausea associated  with it.  No fevers or chills.  The epigastric pain started again 2 days  ago.  It has started to subside this morning, and he just has residual  nausea now.  He points to really midepigastrium as the site of his pain.  He does not think anything in particular brings it on.  Nothing in  particular makes it better.   CURRENT MEDICATIONS:  Aspirin, metformin, Lipitor, glyburide,  metoprolol, Avodart, Digitek, levothyroxine, Januvia, Plavix,  pentoxifylline, iron pill, multivitamin, B-12 shot, Coumadin, Prilosec,  lisinopril, Lasix.   PHYSICAL EXAMINATION:  Weight 209 pounds, which is down 14 pounds since  his last visit.  Blood pressure 120/68.  Pulse 84.  CONSTITUTIONAL:  Generally well appearing.  ABDOMEN:  Soft.  Nontender.  Non-distended.  Normal bowel sounds.   ASSESSMENT AND PLAN:  A 77 year old man with intermittent epigastric  discomforts.   Symptoms may be biliary.  They are intermittent in nature, located in  his epigastrium.  I did do an EGD on him just 5 or 6 months ago, and his  stomach is normal, so I am going to get a basic set of labs  today  including a complete metabolic profile, CBC, and I will arrange for him  to have an abdominal ultrasound performed.  I do not want to start him  on any new medicines for now.  He knows to call if the pain recurs  before he hears back from [place] about the test results.     Rachael Fee, MD  Electronically Signed    DPJ/MedQ  DD: 07/26/2007  DT: 07/27/2007  Job #: [id number]   cc:   Arta Silence, MD

## 2011-02-17 NOTE — Assessment & Plan Note (Signed)
[facility]                            CARDIOLOGY OFFICE NOTE   NAME:Bettinger, HULAN SZUMSKI                       MRN:          [id number]  DATE:08/16/2008                            DOB:          1930/09/11    PRIMARY CARE PHYSICIAN:  Arta Silence, MD   CLINICAL HISTORY:  Mr. Mulvey is 77 years old and returned for  management of his coronary heart disease and atrial fibrillation.  He  had a remote diaphragmatic wall infarction and subsequently had a drug-  eluting stent placed in 2004 for in-stent restenosis.  He has good LV  function.  He has done well from that standpoint.  He has had no recent  chest pain, shortness of breath, or palpitations.   His past medical history is significant for diabetes, hypertension,  hyperlipidemia, peripheral vascular status post bilateral renal  stenting, reflux, and hiatal hernia.  He also has chronic renal  insufficiency with creatinines in the range of 1.9.  He recently saw Dr.  [name], who switched him from metoprolol to Cardizem for better control of  his blood pressure.   His current medications include;  1. Cartia, unknown dose.  2. Hydrochlorothiazide, unknown dose.  3. Insulin.  4. Aspirin.  5. Metformin.  6. Lipitor 80 mg at bedtime.  7. Glyburide.  8. Avodart.  9. Digoxin 0.125 mg daily.  10.Levothyroxine.  11.Januvia.  12.Pentoxifylline.  13.Iron.  14.Coumadin.  15.Prilosec.   On examination, the blood pressure was 177/80 and the pulse 78 and  regular.  There was no venous tension.  The carotid pulses were full  without bruits.  Chest was clear without rales or rhonchi.  Heart rhythm  was regular.  There were no murmurs or gallops.  Abdomen was soft with  normal bowel sounds.  There was no hepatosplenomegaly.  There was 1+  edema with slight stasis changes.   Electrocardiogram showed sinus rhythm and was normal.   IMPRESSION:  1. Coronary heart disease, status post remote diaphragmatic  wall      infarction, status post restenosis treated with drug-eluting stent      in 2004, now stable.  2. Paroxysmal atrial fibrillation, now in sinus rhythm on rate-control      medications and Coumadin.  3. Good left ventricular function.  4. Hypertension.  5. Diabetes.  6. Hyperlipidemia.  7. Peripheral vascular disease status post bilateral renal stenting.  8. Renal insufficiency with creatinine in the range of 1.9.   RECOMMENDATIONS:  Mr. Beitler appears to be doing well from standpoint of  his heart.  He does have some edema of  the lower extremities, so this  is probably related to venous insufficiency and not to disabling.  His  blood pressure is quite high.  We will try and find the dose of Cartia  that he is taking to see if we can make some adjustments.  He needs to  see Dr. Hetty Ely in December, and I will plan to see him back in  followup in a year.     Bruce Elvera Lennox Juanda Chance, MD, [facility]  Electronically Signed  BRB/MedQ  DD: 08/16/2008  DT: 08/17/2008  Job #: [id number]

## 2011-02-17 NOTE — Discharge Summary (Signed)
Justin Lane, Justin Lane NO.:  [id number]   MEDICAL RECORD NO.:  [id number]          PATIENT TYPE:  INP   LOCATION:  5040                         FACILITY:  [facility]   PHYSICIAN:  Willow Ora, MD           DATE OF BIRTH:  [birth year]   DATE OF ADMISSION:  09/21/2007  DATE OF DISCHARGE:  09/23/2007                               DISCHARGE SUMMARY   DISCHARGE DIAGNOSES:  1. Acute on chronic renal insufficiency in setting of subacute high      dose NSAID use and ACE inhibitor as well as Lasix.  2. History benign prostatic hypertrophy.  3. Peripheral vascular disease.  4. History of L4-L5 herniated disk with plan for lumbar laminectomy      next week per Dr. Annell Greening.  5. Peripheral vascular disease and history of renal stent.  6. Coronary artery disease status post right coronary artery stent in      2004.  7. Diabetes type 2.  8. Paroxysmal atrial fibrillation, Coumadin currently on hold for the      upcoming lumbar laminectomy.  We will plan to discharge the patient      to home with a Lovenox bridge.   HISTORY OF PRESENT ILLNESS:  Justin Lane is a 77 year old gentleman who  was admitted on September 21, 2007 with plans for lumbar laminectomy.  Preoperative blood work in the short-stay unit noted an elevated  creatinine of 4.3.  As a result this finding, his surgery was postponed,  and a renal consult was requested.  We were asked to admit the patient.   PAST MEDICAL HISTORY:  1. Hypokalemia.  2. Coronary artery disease with history of MI.  3. Hypothyroid.  4. Atrial fibrillation on chronic Coumadin.  5. Gout.  6. B12 deficiency.  7. Anemia.  8. BPH.  9. History of renal calculus.  10.History of diverticular disease.  11.Hyperlipidemia.  12.Peripheral vascular disease.  13.Diabetes type 2.   COURSE OF HOSPITALIZATION:  1. Acute on chronic renal insufficiency.  The patient has a baseline      creatinine of approximately 1.9.  Creatinine at time of admission    was 4.6.  Renal consult was requested, and the patient was seen by      Dr. Elvis Coil, and he felt that the patient's large doses of      NSAIDs which he had been taking due to back pain had been      contributing to the patient's acute renal insufficiency in the      setting of NSAID use and Lasix.  His Lasix was held as well as his      ACE inhibitor.  At this time, his creatinine is 2.84.  Per Dr.      Elvis Coil recommendation, he recommended the patient be      discharged to home with plans to schedule surgery next week after      follow-up BMET when patient's creatinine is at baseline.  2. Paroxysmal atrial fibrillation.  The patient will be discharged to      home  on Lovenox subcutaneously for bridge during preoperative time.      It is unclear when the patient's surgery will be performed next      week.  Should his surgery be postponed further, he will need to be      resumed back on his Coumadin.  3. Diabetes type 2.  Per renal recommendations, they recommended that      oral agents be held.  Will continue to hold these at time of      discharge.  He is instructed to contact his primary care Torre Pikus      should his sugars start running greater than 250.   MEDICATIONS:  At time of discharge.  1. Ultram 50 mg p.o. q.6h. p.r.n. pain.  2. Avodart 0.5 mg p.o. daily.  3. Levothyroxine 25 mcg p.o. daily.  4. Lipitor 80 mg p.o. daily in the evening.  5. Multivitamin 1 tablet p.o. daily.  6. Nitrostat 0.3 mg sublingual p.r.n.  7. Digoxin 0.125 mg every other day, currently renally adjusted per      renal team.  8. Prilosec 20 mg p.o. daily.  9. Iron tablets as before.  10.Metoprolol 100 mg p.o. b.i.d.  11.Lovenox subcutaneous injections.   Will discuss renal dosing with pharmacy prior to discharge.   PERTINENT LABORATORY DATA:  At time of discharge, hemoglobin 11.9,  hematocrit 34.4, BUN 79, creatinine 2.8.   DISPOSITION:  The patient will be discharged to home.   FOLLOW  UP:  He is scheduled to follow up in Dr. Ophelia Charter' office on Tuesday  December 23 for lab work.  He is to follow up with Dr. Hetty Ely next  week, and we will arrange follow-up prior to discharge.  At his follow-  up appointment with Dr. Hetty Ely, he will need to be evaluated in  regards to his volume status and sugars and determination made as to  whether or not to resume Lasix, Coumadin, metformin, glyburide and  lisinopril.  He will need evaluation in terms of anticoagulation plans  relative to upcoming surgery.      Sandford Craze, NP      Willow Ora, MD  Electronically Signed    MO/MEDQ  D:  09/23/2007  T:  09/24/2007  Job:  [id number]   cc:   Arta Silence, MD  Veverly Fells. Ophelia Charter, M.D.

## 2011-02-18 ENCOUNTER — Other Ambulatory Visit: Payer: Self-pay | Admitting: Pharmacist

## 2011-02-18 MED ORDER — WARFARIN SODIUM 5 MG PO TABS: ORAL_TABLET | ORAL | Status: DC

## 2011-02-20 NOTE — Discharge Summary (Signed)
NAME:  Justin Lane, Justin Lane NO.:  [id number]   MEDICAL RECORD NO.:  [id number]                   PATIENT TYPE:  OIB   LOCATION:  6522                                 FACILITY:  [facility]   PHYSICIAN:  Salvadore Farber, M.D. [facility]         DATE OF BIRTH:  06-25-1930   DATE OF ADMISSION:  04/15/2004  DATE OF DISCHARGE:                           DISCHARGE SUMMARY - REFERRING   ADDENDUM:  The dictation number is actually [id number].   I forgot to put in laboratory data. So rather than doing this as an  addendum, if you do not mind, put a new section that says laboratory data.   LABORATORY DATA:  WBCs 10, hemoglobin 11.7, hematocrit 34, platelets 185 at  discharge. Sodium 139, potassium 4.3, BUN 17, creatinine 1.2, glucose 127 at  discharge.      Gene Serpe, P.A. [facility]                      Salvadore Farber, M.D. [facility]    GS/MEDQ  D:  04/16/2004  T:  04/16/2004  Job:  [id number]

## 2011-02-20 NOTE — Assessment & Plan Note (Signed)
[facility]                         GASTROENTEROLOGY OFFICE NOTE   NAME:Lane, Justin ROBENSON                       MRN:          [id number]  DATE:01/21/2007                            DOB:          1930/05/06    GI PROBLEM LIST:  1. FOTB positive December 2007. This was while on Coumadin with very      elevated INR. Colonoscopy February 2008 was normal except for      internal hemorrhoids.  2. GERD related dyspeptic symptoms. EGD March 2008 mild nonspecific      gastritis, CLO negative. Symptoms improved on once daily PPI.   INTERVAL HISTORY:  I last saw Justin Lane about a month and a half ago at  the time of his upper endoscopy. Since then he has been on once daily  Prilosec which he takes with his breakfast meal. He says he has had no  recurrence of his periumbilical abdominal discomfort since beginning  this medicine. It used to be quite bothersome.   CURRENT MEDICATIONS:  Digoxin, aspirin, Glucophage, Lipitor, Glyburide,  metoprolol, levothyroxine, Plavix, Trental, Flomax, Avodart,  multivitamin, iron twice day, vitamin B12.   PHYSICAL EXAMINATION:  VITAL SIGNS:  Weight is 223 pounds, height 6 foot  0 inches. Blood pressure 124/50, pulse 84.  HEART:  Regular rate and rhythm.  LUNGS:  Clear to auscultation bilaterally.  ABDOMEN:  Soft, nontender, nondistended, normal bowel sounds.   ASSESSMENT/PLAN:  A 77 year old man with gastroesophageal reflux disease  related dyspeptic symptoms.   His symptoms are much improved while taking OTC Prilosec once daily. He  will continue this indefinitely. He knows it is probably best to take  this 20-30 minutes prior to his breakfast meal. He does not exactly take  it that way but even the way he is taking it seems effective for him. He  will get back in touch with me if he has any further questions or  concerns. We will put him in our reminder system for a colonoscopy in 10  years if it is still clinically  relevant at that point.     Rachael Fee, MD  Electronically Signed    DPJ/MedQ  DD: 01/21/2007  DT: 01/21/2007  Job #: [id number]

## 2011-02-20 NOTE — Assessment & Plan Note (Signed)
[facility]                            CARDIOLOGY OFFICE NOTE   NAME:Schlachter, PHUC KLUTTZ                       MRN:          [id number]  DATE:01/27/2007                            DOB:          03-22-1930    PRIMARY CARE PHYSICIAN:  Arta Silence, MD   CLINICAL HISTORY:  Donald Pore returned for a followup visit today for  management of his coronary heart disease and paroxysmal atrial  fibrillation.  He is doing fairly well and has had no recent chest pain  or palpitations.  He does have fatigue and shortness of breath with  exertion but these have not changed.   When I saw him last time, we were waiting to restart his Coumadin after  he had endoscopy which was done by Dr. Christella Hartigan.  This showed some mild  gastritis but he felt that the Coumadin could be restarted for his  paroxysmal atrial fibrillation.   He also has been seen by Dr. Hetty Ely and has a creatinine of 2, and his  Lasix was cut back to a half of a 40 mg tablet, and his lisinopril was  reinstituted for renal protection.  He is also diabetic.   PAST MEDICAL HISTORY:  1. Diabetes.  2. Hypertension.  3. Hyperlipidemia.  4. Peripheral vascular disease with bilateral iliac stenting.  He has      good LV function.   CURRENT MEDICATIONS:  Aspirin, Plavix, metformin, glyburide, Januvia,  metoprolol, digoxin, Avodart, levothyroxine, pentoxifylline, iron,  Coumadin, Prilosec, and lisinopril.   PHYSICAL EXAMINATION:  VITAL SIGNS:  Blood pressure 100/58.  Pulse 78  and regular.  NECK:  There was no venous distention.  The carotid pulses were full  without bruits.  CHEST:  Clear.  HEART:  Rhythm was regular.  I could hear no murmurs or gallops.  ABDOMEN:  Soft with normal bowel sounds.  There is no  hepatosplenomegaly.  EXTREMITIES:  There is trace peripheral edema and pedal pulses were  equal.   Electrocardiogram showed sinus rhythm and was normal.   IMPRESSION:  1. Paroxysmal  atrial fibrillation now in sinus rhythm, managed on rate      control medications and Coumadin.  2. Coronary artery disease status post remote diaphragmatic wall      infarction and status post a drug alluding stent in 2004, for      instent restenosis in the right coronary artery.  3. Good left ventricular function.  4. Hypertension.  5. Diabetes.  6. Hyperlipidemia.  7. Peripheral vascular disease status post bilateral renal stenting.  8. Renal insufficiency with creatinine of 2.   RECOMMENDATIONS:  I think Mr. Depuy is doing well from a cardiac  standpoint.  He is on aspirin, Plavix, and Coumadin, and I think his  bleeding risk is moderately high with this combination and his age.  He  is now four years out from his last drug alluding stent and I think the  risks of continuing Plavix outweigh the benefits, so we will plan to  stop this.  We will continue his other medications and I will plan to  see him back in six months.     Bruce Elvera Lennox Juanda Chance, MD, [facility]  Electronically Signed    BRB/MedQ  DD: 01/27/2007  DT: 01/27/2007  Job #: [id number]

## 2011-02-20 NOTE — Cardiovascular Report (Signed)
NAME:  Justin Lane, Justin Lane                          ACCOUNT NO.:  [id number]   MEDICAL RECORD NO.:  [id number]                   PATIENT TYPE:  INP   LOCATION:  2901                                 FACILITY:  [facility]   PHYSICIAN:  Charlies Constable, M.D.                  DATE OF BIRTH:  1930/04/02   DATE OF PROCEDURE:  06/22/2003  DATE OF DISCHARGE:                              CARDIAC CATHETERIZATION   PROCEDURES PERFORMED:  1. Cardiac catheterization.  2. Percutaneous coronary intervention.   CARDIOLOGIST:  Charlies Constable, M.D.   CLINICAL HISTORY:  Justin Lane is a 77 year old gentleman who has a previous  diaphragmatic wall infarction in 1997 treated with PTCA by Justin Lane and  he subsequently had a Crown stent placed in the right coronary artery for  restenosis in 1998.  He has done well since that time, but recently was  admitted with prolonged chest pain and had positive troponins consistent  with a non-ST elevation myocardial infarction.  He was admitted yesterday  and scheduled for angiography today.   DESCRIPTION OF PROCEDURE:  The procedure was performed via the right femoral  artery using arterial sheath and 6 French preformed coronary catheters.  A  femoral arterial puncture was performed and Omnipaque contrast was used.  At  completion of the diagnostic study I the decision to proceed with  intervention on the right coronary artery in-stent restenosis.   The patient was on an Integrelin drip and was given 2,000 units of  additional heparin to prolonged the ACT greater than 200 seconds.  We used a  JR-4 guiding catheter with side holes.  We initially used a Luge wire, but  this straightened the proximal portion of the vessel more than we want, and  so we switched to a Hi-Torque floppy wire.  Shortly after passing the wire  the patient became hypotensive and developed a slight tachycardia.  We were  unsure what the etiology of this was.  We treated him with IV fluids and  waited, and his pressure responded.  We then proceeded with the  intervention.   After crossing with the Hi-Torque floppy  wire we predilated with a 2.5 x 15  mm Quantum Maverick performing one inflation up to 10 atmospheres for 35  seconds.  We then deployed a 2.5 x 16 mm TAXUS stent deploying this with one  inflation of 10 atmospheres for 30 seconds.  This resulted in good expansion  of the stent.  The vessel size was actually a little smaller than a 2.5.  There was either a stepdown or a small edge tear at the proximal edge.  We  took multiple pictures and finally made a decision not to place an  additional stent.   The patient tolerated the procedure well and left the laboratory in  satisfactory condition.   RESULTS:  Left Main Coronary Artery:  The left main coronary artery had  a  30% distal stenosis.   Left Anterior Descending Artery:  The left anterior descending gave rise to  two diagonal branches and a septal perforator.  The LAD was irregular, but  there was no major obstruction.   Circumflex Artery:  The circumflex artery gave rise to a large intermediate  branch, which bifurcated in two sub-branches and an AV branch which  terminated in the posterolateral branch.  These vessels were irregular, but  there was no significant obstruction.   Right Coronary Artery:  The right coronary artery is a moderately large  vessel that gave rise to a posterior descending and a posterolateral branch.  There was 80% narrowing in the mid right coronary artery within the previous  stent, which was diffuse within the stent.   VENTRICULOGRAPHIC DATA:  Left Ventriculogram:  The left ventriculogram  performed in the RAO projection showed good wall motion with no areas of  hypokinesis.  The estimated ejection fraction was 60%.   Following stenting of the in-stent restenosis this improved from 80% to less  than 10%.  There was a stepdown or a small edge tear at the proximal edge,  which we  elected not to treat.   CONCLUSIONS:  1. Coronary artery disease status post remote diaphragmatic myocardial     infarction and remote stenting of the right coronary artery in 1998 with     30% narrowing in the left main coronary artery, irregularities in the     left anterior descending and circumflex arteries, 80% in-stent restenosis     in the mid right coronary artery, and normal left ventricular function.  2. Successful percutaneous coronary intervention for in-stent restenosis in     the mid right coronary artery using a TAXUS stent with improvement in     percent narrowing from 80% to less than 10%.   DISPOSITION:  The patient was taken to the recovery room for further  observation.  He will be at somewhat increased risk of recurrence due to the  diffuse disease in the segment proximal to the stented segment.                                                 Charlies Constable, M.D.    BB/MEDQ  D:  06/22/2003  T:  06/23/2003  Job:  [id number]   cc:   Justin Lane, M.D.  [address] [place]  [place] [address]  Fax: [phone]   Cardiopulmonary Laboratory

## 2011-02-20 NOTE — H&P (Signed)
NAMESENAY, SISTRUNK NO.:  [id number]   MEDICAL RECORD NO.:  [id number]          PATIENT TYPE:  EMS   LOCATION:  MAJO                         FACILITY:  [facility]   PHYSICIAN:  Garrison Columbus. Haithcock, MDDATE OF BIRTH:  Feb 29, 1930   DATE OF ADMISSION:  08/19/2006  DATE OF DISCHARGE:                                HISTORY & PHYSICAL   CHIEF COMPLAINT:  Chest pain.   HISTORY OF PRESENT ILLNESS:  [age]-year-old male with a history of  coronary artery disease dating back to 17 with another MI in 1998, status  post a TAXUS stent to the RCA in 2004 for in-stent stenosis with an acute  onset of chest pain, shortness of breath.  Tonight he took aspirin and  sublingual nitroglycerin at home, upon arrival to the emergency room, his  symptoms had resolved; however, he had new onset of atrial fibrillation with  biventricular response.  Further evaluation in the emergency room included  an EKG which showed no apparent injury.  Point of care cardiac enzymes,  which were negative.  CBC and a Chem-7 which were also unremarkable.  The  patient was then given Cardizem and rate control was achieved.  The patient  spontaneously cardioverted to sinus rhythm.   ALLERGIES:  NO KNOWN DRUG ALLERGIES.   MEDICATIONS:  Metformin, aspirin, Lipitor, Glyburide, Lopressor, Flonase,  Avodart, Levothyroxine, Plavix, __________.   PAST MEDICAL HISTORY:  1. MI in 82 and 1998.  2. PCI in 2004 with TAXUS for in-stent stenosis.  3. Peripheral vascular disease, status post stenting by Dr. [name].  4. Hypertension.  5. Diabetes.   SOCIAL HISTORY:  Greater than 20 pack year of smoking history, quit several  years ago.  No alcohol.   FAMILY HISTORY:  History of coronary artery disease.   REVIEW OF SYSTEMS:  Worsening shortness of breath and dyspnea on exertion  over the last several weeks.  Otherwise, negative.   PHYSICAL EXAMINATION:  VITAL SIGNS:  Temperature is 97.  Pulse is 90.  Respiratory rate is 18.  Blood pressure is 112/55.  IN GENERAL:  The patient is in no apparent distress.  HEENT:  Shows normocephalic, atraumatic.  Pupils equal, round and reactive  to light and accommodating.  Extraocular motions intact.  LUNGS:  Clear.  HEART:  Regular with a normal S1 and S2.  No gallops.  No murmurs.  ABDOMEN:  Soft and nontender.  EXTREMITIES:  Show no clubbing or cyanosis and +1 edema bilaterally.  NEUROLOGIC EXAM:  Grossly nonfocal.   EKG initially showed atrial fibrillation in the 130s.  MI with no recurrent  of injury.  Normal axis.  Showing MI.  Assessment:  The patient was in  normal sinus rhythm in the 60s.   LABS:  White count 6.7, hematocrit 35.0, platelets 237.  Sodium 141,  potassium 3.7, chloride 107, bicarb 21, BUN 21, creatinine 1.6, glucose 142.   ASSESSMENT:  This is a 77 year old gentleman with onset of atrial  fibrillation.  We will evaluate for ________ cardiomyopathy.  We will  discontinue Cardizem given worsening congestive heart failure symptoms.  Achieve rate  control with metoprolol, heparin and anticoagulation.  We will  check an echocardiogram.  Rule out acute coronary syndrome.  Check TSH.  We  will check lipid panel and hemoglobin A1c.      Daniel B. Haithcock, MD  Electronically Signed     DBH/MEDQ  D:  08/19/2006  T:  08/19/2006  Job:  [id number]

## 2011-02-20 NOTE — Discharge Summary (Signed)
NAME:  Justin Lane, Justin Lane NO.:  [id number]   MEDICAL RECORD NO.:  [id number]                   PATIENT TYPE:  INP   LOCATION:  2016                                 FACILITY:  [facility]   PHYSICIAN:  Charlies Constable, M.D.                  DATE OF BIRTH:  1930-05-30   DATE OF ADMISSION:  06/21/2003  DATE OF DISCHARGE:  06/26/2003                           DISCHARGE SUMMARY - REFERRING   SUMMARY OF HISTORY:  Justin Lane is a 77 year old male who presented in the  morning with chest tightness relieved with sublingual nitroglycerin.  He had  recurrent chest discomfort and used more nitroglycerin and his wife called  EMS.  He describes a two week history of nausea, malaise, diaphoresis,  progressive dyspnea and peripheral edema.  He received Lasix from his  primary physician and the swelling resolved.   PAST MEDICAL HISTORY:  His history is notable for two myocardial infarctions  in 1990 and 1998.  He had angioplasty in 1990, and a stent to the RCA in  1998.  He also has a history of hypertension, hyperlipidemia, type 2  diabetes, gout, nephrolithiasis, questionable peripheral vascular disease,  left hip claudication and remote tobacco use.   LABORATORY DATA:  Iron slightly high 158, ferritin 31.  Total cholesterol  158, triglycerides 108, HDL 53, LDL 83.  Hemoglobin A1c elevated 7.7.  Admission sodium 139, potassium 4.5, BUN 27, creatinine 1.2.  Normal LFTs.  Subsequent chemistries are unremarkable; however, his Glucometers ranged  from the 130-150s.  Admission H&H 12.9/37.4 with normal indices, platelets  224,000, WBC 8.8.  Subsequent hematologies were not notable except for a  drop in his H&H and reached a low on September 19, 8.9/25.1; on September  20, 9.1/25.9.  CK total and MB were negative for myocardial infarction;  however, Troponins were elevated 0.24, 0.27 and 0.11.   Chest x-ray did not reveal any active processes.   EKG showed normal sinus rhythm,  right axis deviation, delayed R-wave  progression, nonspecific ST-T wave changes; subsequent EKGs were essentially  the same.   HOSPITAL COURSE:  Justin Lane was admitted to unit 2000 by Dr. Charlies Constable  and Maple Mirza, P.A.-C.  He was placed on IV Integrilin.  Overnight,  he did not have any further chest discomfort and the resident placed him on  GI prophylaxis and resumed his home Lipitor.  Avandia and Glucophage were  placed on hold in the anticipation of cardiac catheterization.  On June 21, 2003, it was noted that the patient had a six-beat run of ventricular  tachycardia; per nursing, the patient was asymptomatic.   Cardiac catheterization was performed on June 22, 2003, by Dr. Charlies Constable.  According to his progress note, he had some irregularities in the  LAD and circumflex, 30% distal left main, 80% mid RCA at the prior stent  site.  A stent was placed to  the RCA, reducing this from 80% to 10%.  Dr.  Juanda Chance noted __________ versus small edge tear at proximal edge and was  unable to put in a second stent.  EF 60%.  Stent used was a TAXUS Express  II.   The diabetes coordinator saw the patient on June 22, 2003, and noted  that the patient's hemoglobin A1c was elevated at 7.7 and as noted, the  patient has never had any formal diabetes education.  The patient stated  that he will talk to his medical doctor and see if he wants him to go.  Postprocedure, the patient was placed on Plavix.  Post sheath removal and  bedrest, it was noted that he had a right groin hematoma and his H&H had  dropped.  Dr. Bland Bing, after review, felt that this was a minor  hematoma with ecchymosis.   Stools were tested and found to be guaiac positive.  GI consultation was  obtained on September 20.  It was felt that he should undergo an EGD for  further evaluation of his dark heme-positive stools; thus, his discharge was  delayed.  He was placed on Protonix and iron and  it was felt that he had  some gastritis on his EGD but no overt explanation of his heme-positive  stools.  GI, Dr. Melvia Heaps, felt that he should undergo a colonoscopy  when okay with Cardiology; they were reluctant to perform colonoscopy while  the patient is on full-strength aspirin and Plavix, given that biopsies may  be needed.  It was felt that he should have a colonoscopy, once off Plavix  and aspirin, pursue it sooner if acutely bleeds.  It was felt that he could  continue his iron up to b.i.d. and he should have his H&H checked regularly,  at least within the next 10-14 days, and then 6-8 weeks thereafter.  Gastric  biopsies for possible H. pylori are pending and they will treat if positive.  On June 26, 2003, after review, Dr. Juanda Chance felt that the patient could  be discharged home.   DISCHARGE DIAGNOSES:  1. Non-Q wave myocardial infarction, status post stent to the right coronary     artery.  2. Heme-positive anemia.  3. Hypertension.  4. Diabetes with an elevated hemoglobin A1c.  5. History as previously.   DISPOSITION:  Justin Lane is discharged home.   DISCHARGE MEDICATIONS:  New medications:  1. Protonix 40 mg daily.  2. Plavix 75 mg daily for at least six months.  3. Ferrous sulfate 325 mg b.i.d.  4. He was asked to increase his Lopressor to 100 mg b.i.d.  He was asked to continue his:  1. Aspirin 325 mg daily.  2. Lotensin 40 mg daily.  3. Avandia 8 mg daily.  4. Glucophage 1000 mg b.i.d.  5. Lipitor 10 mg nightly.  6. Glyburide 6 mg, 1/2 tablet in the morning, at 5 p.m., and at 10 p.m.  7. Trental 400 mg t.i.d.   ACTIVITY:  He was asked to avoid lifting, driving, sexual activity or heavy  exertion x1 week.   DIET:  Maintain a low-salt, low-fat, low-cholesterol diet.   WOUND CARE:  If he has any problems with his catheterization site, he was  asked to call immediately.   FOLLOWUP:  He will go by our office in approximately two weeks and have a   CBC drawn.  He will follow up with Dr. Juanda Chance on July 10, 2003, at 4 p.m.  He was asked  to arrange a followup appointment with Dr. [name] to  keep an eye on his blood counts and to follow up on his sugars and  outpatient diabetes management.      Joellyn Rued, P.A.-C  [facility]                Charlies Constable, M.D.    EW/MEDQ  D:  06/26/2003  T:  06/26/2003  Job:  [id number]   cc:   [name], M.D.  [address] [place]  [place] [address]  Fax: [phone]

## 2011-02-20 NOTE — H&P (Signed)
NAME:  Justin Lane, Justin Lane NO.:  [id number]   MEDICAL RECORD NO.:  [id number]                   PATIENT TYPE:  INP   LOCATION:  2901                                 FACILITY:  [facility]   PHYSICIAN:  Carole Binning, M.D. [facility]         DATE OF BIRTH:  03-Jun-1930   DATE OF ADMISSION:  06/21/2003  DATE OF DISCHARGE:                                HISTORY & PHYSICAL   CHIEF COMPLAINT:  Chest pain.   HISTORY OF PRESENT ILLNESS:  The patient is a 77 year old man who has a  known history of coronary artery disease.  He has had two prior myocardial  infarctions in 1990 and in 1998.  He underwent PTCA in 1990 and he underwent  stent placement to his right coronary artery in 1998.  He has done well  since then until approximately two weeks ago.  At that time he noted  intermittent episodes of chest discomfort described as a pressure in the  chest radiating into the neck.  It was associated with nausea, diaphoresis  and progressive edema.  Last evening he had a more significant episode of  chest discomfort which was relieved with one nitroglycerin.  He then went to  sleep, but then awoke early this morning with recurrent substernal chest  pain.  He again took a nitroglycerin without relief and EMS was summoned and  brought the patient to the emergency room.  He was pain free on arrival.   The patient also complains of a long history of progressive exertional  dyspnea occurring with daily activity.  He has intermittent lower extremity  edema.  He denies any PND or orthopnea.  His cardiac risk factors are  positive for hypertension, hyperlipidemia, diabetes mellitus, family history  of premature coronary artery disease and a previous history of tobacco use.   PAST MEDICAL HISTORY:   SOCIAL HISTORY:   FAMILY HISTORY:   REVIEW OF SYMPTOMS:   MEDICATIONS:  As documented in the physician assistant admission note.   PHYSICAL EXAMINATION:  GENERAL:  This is a  somewhat overweight but otherwise  well appearing male in no acute distress.  VITAL SIGNS:  Temperature is 97.2, pulse 85 and regular, respirations 18,  blood pressure 156/71, oxygen saturation is 99% on two liters nasal cannula.  SKIN:  Warm and dry without generalized rash.  HEENT:  Normocephalic, atraumatic.  Sclerae are anicteric. Oral mucosa is  unremarkable.  Neck:  No adenopathy or thyromegaly.  No jugular venous  distention.  Carotid upstroke is normal without bruit.  CHEST:  Minimal bibasilar crackles with breath sounds otherwise clear.  CARDIAC:  PMI is non-palpable, regular rate and rhythm, positive S4, normal  S1 and S2 without rub, murmur or S3.  ABDOMEN:  Obese, soft and non-tender with normal bowel sounds and no bruits.  No organomegaly.  EXTREMITIES:  Trace ankle edema.  No clubbing or cyanosis.  Peripheral  pulses measure 2+ bilaterally  without bruits.  Pedal pulses are 1+  bilaterally.   EKG reveals normal sinus rhythm at a rate of 83.  There is a rightward axis.  There is borderline R wave progression in the precordium, otherwise the EKG  is unremarkable.   LABORATORY DATA:  Notable for elevated cardiac markers with a Troponin I on  presentation of 0.16 and a follow-up Troponin I of 0.19.  Other laboratory  data is documented in the chart.   ASSESSMENT:  The patient is a 77 year old male with known coronary artery  disease and multiple cardiac risk factors.  He presents with symptoms  compatible with an acute coronary syndrome with an elevation in Troponin.  He is currently pain free.   PLAN:  The patient will be treated with aspirin, heparin, nitroglycerin and  Integrelin.  He will be admitted to the step-down ICU bed.  Cardiac enzymes  will be cycled to rule out myocardial infarction.  We will proceed with  cardiac catheterization within the next 24 to 48 hours as schedule allows.  This will be done more urgently should the patient develop recurrent  unstable  symptoms.                                                Carole Binning, M.D. [facility]    MWP/MEDQ  D:  06/21/2003  T:  06/21/2003  Job:  [id number]   cc:   Charlies Constable, M.D.   [name], M.D.  [address] [place]  [place] [address]  Fax: [phone]

## 2011-02-20 NOTE — Discharge Summary (Signed)
NAMESAYYID, Justin Lane NO.:  [id number]   MEDICAL RECORD NO.:  [id number]          PATIENT TYPE:  INP   LOCATION:  3729                         FACILITY:  [facility]   PHYSICIAN:  Salvadore Farber, MD  DATE OF BIRTH:  1930-02-08   DATE OF ADMISSION:  08/19/2006  DATE OF DISCHARGE:  08/21/2006                                 DISCHARGE SUMMARY   PROCEDURES:  None.   PRIMARY DIAGNOSIS:  Paroxysmal atrial fibrillation (sinus rhythm at  discharge).   SECONDARY DIAGNOSES:  1. Chest pain, enzymes negative for myocardial infarction and outpatient      stress test arranged.  2. History of coronary artery disease with a Taxus stent to the circumflex      in 2004 for in-stent restenosis, single vessel disease.  3. Preserved left ventricular function with an ejection fraction of 60% at      catheterization.  4. Non-insulin-dependent diabetes.  5. Anticoagulation with Coumadin started this admission.  6. History of myocardial infarction in 1990, 1998.  7. History of peripheral vascular disease with bilateral iliac stents in      July 2005.   Time of discharge 34 minutes.   HOSPITAL COURSE:  [name] is a 77 year old male with a previous history  of coronary artery disease.  He had acute onset of chest pain and shortness  of breath and took aspirin and sublingual nitroglycerin without benefit.  He  came to the emergency room by EMS.  He was in atrial fibrillation with rapid  ventricular response.  He converted spontaneously and was admitted for  further evaluation and treatment.   His paroxysmal atrial fibrillation brief and resolved spontaneously.  However, he was evaluated by Dr. Samule Lane who felt that his Lane of  recurrence with high, therefore Coumadin was added to his medication  regimen.  It was not felt that he needed full cross coverage with heparin  however and could load as an outpatient.  He is to continue Plavix and  decrease his aspirin to 81 mg a day next  week.   His chest pain resolved, and his CK-MB was within normal limits.  His  troponins had a minimal elevation, but this was felt due to tachycardia and  it was not felt that cardiac catheterization was indicated at this time.  He  will have an outpatient stress test and follow up with Dr. Juanda Lane.   A lipid profile was performed which showed total cholesterol of 131,  triglycerides 164, HDL 44, LDL 54.  He is on Lipitor 10 mg a day and is to  continue this.   [name] complained of some orthostatic dizziness at times.  He is not  currently having this.  He is to follow up with his primary care physician  for this.   [name]' TSH was slightly elevated to 6.240.  A free T3 is pending at the  time of admission, and he is to follow up with his primary care physician to  see if his levothyroxine does need to be changed.  We will make no changes  at this  time.   On 08/20/2006, [name] was ambulating without chest pain or shortness of  breath.  He was evaluated by Dr. Samule Lane and considered stable for discharge  with outpatient follow-up arranged.   DISCHARGE INSTRUCTIONS:  His activity level is to be increased slowly.  He  is to stick to a low-fat diabetic diet.  He is not to have any caffeine or  decaffeinated products 24 hours before his stress test.  He can have liquids  the morning of his stress test but nothing after 8:30.  He is to be at  [facility] at noon for a Coumadin check on Tuesday, November 20, and  a stress test is scheduled the same day for 12:30.  He is to follow up with  Dr. Juanda Lane on November 26 at 10:45 and with Dr. Hetty Lane as needed.   DISCHARGE MEDICATIONS:  1. Coumadin 5 mg daily or as directed.  2. Cardizem CD 120 mg a day.  3. Aspirin 325 mg daily, decrease to a 81 mg daily next week.  4. Glucophage 1000 mg b.i.d.  5. Lipitor 10 mg a day.  6. Glyburide 6 mg daily, hold the day of stress test.  7. Metoprolol 100 mg b.i.d.  8. Levothyroxine  25 mcg daily.  9. Plavix 75 mg daily.  10.Trental 40 mg t.i.d.  11.Lasix 40 mg daily.  12.Flomax 4 mg daily.  13.Avodart daily.  14.Vitamins and iron as prior to admission.      Justin Demark, PA-C      Salvadore Farber, MD  Electronically Signed    RB/MEDQ  D:  08/20/2006  T:  08/20/2006  Job:  [id number]   cc:   Justin Silence, MD

## 2011-02-20 NOTE — Op Note (Signed)
NAME:  Justin Lane, Justin Lane NO.:  [id number]   MEDICAL RECORD NO.:  [id number]                   PATIENT TYPE:  OIB   LOCATION:  2899                                 FACILITY:  [facility]   PHYSICIAN:  Salvadore Farber, M.D. [facility]         DATE OF BIRTH:  [birth year]   DATE OF PROCEDURE:  04/15/2004  DATE OF DISCHARGE:                                 OPERATIVE REPORT   PROCEDURE:  Abdominal aortography with bilateral lower extremity runoff to  the feet and bilateral common iliac stenting.   PERFORMING PHYSICIAN:  Salvadore Farber, M.D. [facility]   INDICATIONS FOR PROCEDURE:  The patient is a 77 year old gentleman who  presents with bilateral hip tiredness with walking less than one hundred  feet.  Resting ABI's were 0.85 on the right and 0.92 on the left.  With  exercise these dropped to 0.52 and 0.56, respectively.  He had no rest pain  or ulceration.  Based on these symptoms and exercise ABI's suggestive of  aortoiliac disease, he is brought to the laboratory for planned diagnostic  angiography with an eye to percutaneous intervention.   PROCEDURAL TECHNIQUE:  Informed consent was obtained.  Under 1% lidocaine  local anesthesia a 5 French sheath was placed in the right common femoral  artery using the modified Seldinger technique.  A pigtail catheter was  advanced to the abdominal aorta.  Abdominal aortography with bilateral lower  extremity runoff was performed by power injection.  This demonstrated a hazy  stenosis at the ostium of both common iliac arteries as well as a 70%  stenosis of the mid portion of the left common iliac.  None of these  appeared critical angiographically.  Therefore, pressure interrogation was  performed.  A 5 [place] Ental catheter was advanced over the wire into the  abdominal aorta and pulled back across the right iliac lesion performed.  This demonstrated a resting gradient of 20 mmHg peak to peak.  A LIMA  catheter was then advanced  and used to select the left common iliac artery.  The [facility] wire was advanced to the mid portion of the common iliac and the  Ental was advanced to this point over it. Again, pressure pullback was  performed.  Again, this demonstrated a 20 mm translesional gradient peak to  peak.  Based on his symptoms and ABI's consistent with symptomatic iliac  disease and these gradients, the decision was made to proceed to  percutaneous revascularization.   Anticoagulation was initiated with heparin to achieve and maintain an ACT of  greater than 225 seconds.  A 6 French sheath was placed in the left common  femoral artery using the modified Seldinger technique using 1% lidocaine  local anesthesia.  The right sheath was up sized to a 6 [place] over a wire.  The ostial common iliac lesions were then directly stented in a kissing  fashion using 8 x 24 mm Genesis on  Optipro stents.  Both were deployed at 9  atmospheres.  The angiogram demonstrated no residual stenosis and no  dissection.  Pressure pullback across the right common iliac stent  demonstrated no residual gradient.  Pressure pullback across the left common  iliac stent demonstrated no residual gradient of the stented portion, but a  20 mmHg gradient across the lesion of the mid common iliac artery. The  decision was thus made to revascularize this.  The lesion was directly  stented using an 8 x 29 mm Genesis on Optipro deployed at 10 atmospheres.  Repeat pressure pullback demonstrated no residual stenosis.  The patient  tolerated the procedure well and was transferred to the holding room in  stable condition.  At the conclusion of the procedure, posterior tibial  pulses were 2+ bilaterally.   COMPLICATIONS:  None.   FINDINGS:  1. Abdominal aorta:  Mild atherosclerotic disease without significant     stenosis.  2. Left leg:  70% ostial common iliac stenosis and 70% mid common iliac     stenosis, each with an associated 20 mm translesional  gradient at rest.     Both were stented to no residual stenosis and no residual gradient.  The     common femoral and profunda are widely patent without any stenosis.  The     superficial femoral artery has a 30% stenosis proximally.  The remainder     of the vessel is without any significant stenosis.  The anterior tibial     is occluded.  There is two vessel runoff to the foot via the anterior     tibial and posterior tibial.  3. Right leg:  70% ostial common iliac stenosis with associated 20 mm     translesional gradient.  This was stented to no residual stenosis and no     residual gradient.  There is no stenosis of the common femoral, profunda,     superficial femoral artery and popliteal.  The anterior tibial is     occluded proximally. There is two vessel runoff to the foot via the     posterior tibial and peroneal.   IMPRESSION AND PLAN:  1. Successful stenting of both common iliac arteries with excellent     angiographic and hemodynamic result.  2. We will plan on continued aspirin and Plavix.  3. The patient will be admitted to observation with the anticipation of     discharge tomorrow.                                               Salvadore Farber, M.D. [facility]    WED/MEDQ  D:  04/15/2004  T:  04/15/2004  Job:  [id number]

## 2011-02-20 NOTE — Assessment & Plan Note (Signed)
[facility]                         GASTROENTEROLOGY OFFICE NOTE   NAME:Leazer, DEAGLAN LILE                       MRN:          [id number]  DATE:10/08/2006                            DOB:          22-Nov-1929    REFERRING PHYSICIAN:  Everardo Beals. Juanda Chance, MD, [facility]   REASON FOR REFERRAL:  Dr. Juanda Chance asked to evaluate Mr. Devincenzi in  consultation regarding FOBT positive stool.   HISTORY OF PRESENT ILLNESS:  Mr. Piascik is a 77 year old man with  coronary artery disease and atrial fibrillation who presented with INR  of 13 in mid December.  He had no overt GI bleeding, but while in the  hospital he was FOBT positive on examination.  He has never had  colonoscopy.  He did have FOBT testing by his primary care physician 6  or 8 months ago, and was told that there was no microscopic blood in his  stool.  He has held his Coumadin since discharge from the hospital about  2 weeks ago.  He was seen by Dr. Juanda Chance in the office last week, and was  told to continue off his Coumadin until GI evaluation  could be  completed.   As above, he has had no overt GI bleeding, no hematemesis, no  hematochezia, no melena.  Labs during his admission shows hemoglobin was  slightly low, but not dramatically so with a hemoglobin of 11.3.  Normal  platelets.  His discharge INR was much better than his admission of 1.8.  Liver tests were essentially normal.   REVIEW OF SYSTEMS:  Is essentially normal, and is available on his  nursing intake sheet.   PAST MEDICAL HISTORY:  1. Coronary artery disease status post inferior wall MI.  Multiple      percutaneous coronary interventions with the last intervention 2004      with a drug-eluting stent.  2. Paroxysmal atrial fibrillation on Coumadin therapy.  3. Hypertension.  4. Diabetes.  5. Hyperlipidemia.  6. Peripheral artery disease.  7. BPH.  8. Renal insufficiency.  9. Treated hypothyroidism.   CURRENT MEDICATIONS:  Digoxin, aspirin,  Glucophage, Lipitor, glyburide,  metoprolol, Levothroid, Plavix, Trental, Flomax, Avodart, multivitamin,  iron twice daily, vitamin B12.   ALLERGIES:  NO KNOWN DRUG ALLERGIES.   SOCIAL HISTORY:  Lives with his wife.  Nonsmoker.  Nondrinker.   FAMILY HISTORY:  No colon cancer or colon polyps in family.   PHYSICAL EXAMINATION:  He is 6 feet zero inches, 239 pounds.  Blood  pressure 130/60.  Pulse 68.  CONSTITUTIONAL:  Obese, otherwise well appearing.  NEUROLOGIC:  Alert and oriented x3.  EYES:  Extraocular movements intact.  MOUTH:  Oropharynx moist.  No lesions.  NECK:  Supple.  No lymphadenopathy.  CARDIOVASCULAR:  Heart regular rate and rhythm.  LUNGS:  Clear to auscultation bilaterally.  ABDOMEN:  Soft, non-tender, non-distended, normal bowel sounds.  EXTREMITIES:  No lower extremity edema.  SKIN:  No rashes or lesions on visible extremities.   ASSESSMENT AND PLAN:  A 77 year old man found to have Hemoccult positive  stool in the setting of Plavix, aspirin and supra  therapeutic INR from  Coumadin.   He has never had a colonoscopy for colorectal cancer screening, so I  will arrange for that to be done at his soonest convenience.  I would  like to do this next week.  He is on Plavix for a drug-eluting stent,  but I think this should be held until the time of his colonoscopy in the  chance that there is a polyp that has to be removed.  He will continue  aspirin in the meantime.  We will hope to get him back on all of his  anticoagulants as soon as possible.  I see no reason for any further  blood tests prior to colonoscopy next week.     Rachael Fee, MD  Electronically Signed    DPJ/MedQ  DD: 10/08/2006  DT: 10/08/2006  Job #: [id number]   cc:   Everardo Beals. Juanda Chance, MD, [facility]  Arta Silence, MD

## 2011-02-20 NOTE — Assessment & Plan Note (Signed)
[facility]                            CARDIOLOGY OFFICE NOTE   NAME:Justin Lane, Justin Lane                       MRN:          [id number]  DATE:10/01/2006                            DOB:          Sep 30, 1930    PRIMARY CARE PHYSICIAN:  Arta Silence, MD.   CLINICAL HISTORY:  Donald Pore returned for a followup visit after his  recent hospitalization. He was hospitalized with a markedly elevated INR  and worsening renal function and orthostatic hypotension. We think this  was related to a period where he was not feeling well and not eating and  got dehydrated. We hydrated him with fluids and his renal function  returned to normal. He subsequently had a followup renal function at Dr.  Ellis Parents office that showed a creatinine of 1.7 with a baseline of about  2.0.   He says he has been feeling fairly well although he does have some  postural dizziness when he gets up suddenly. He was not put back on  Coumadin at the time of discharge.   His past cardiac history includes history of atrial fibrillation which  required hospitalization and converted to sinus rhythm and had been  managed with rate control medications and Coumadin. He also has coronary  artery disease, a remote diaphragmatic wall infarction and multiple  percutaneous interventions.   PAST MEDICAL HISTORY:  Significant for hypertension, diabetes,  hyperlipidemia, peripheral vascular disease with bilateral iliac  stenting. He has good LV function.   MEDICATIONS:  Lipitor, Lopressor, Plavix, levothyroxine, pentoxifylline,  iron, aspirin, metformin, Glyburide, Flomax, Avodart and digoxin.   PHYSICAL EXAMINATION:  VITAL SIGNS:  Blood pressure is 122/61 and the  pulse 76 and regular.  NECK:  There is no vein extension. Carotid pulses were full without  bruits.  CHEST:  Clear.  CARDIAC:  Rhythm was regular, no murmurs or gallops.  ABDOMEN:  Soft with normal bowel sounds. There was no  hepatosplenomegaly. There was no peripheral edema.   IMPRESSION:  1. Recent episode of dehydration, prerenal azotemia and elevated INR      now corrected.  2. History of paroxysmal atrial fibrillation.  3. Coronary artery disease status post remote diaphragmatic wall      infarction with multiple percutaneous interventions.  4. Good left ventricular function.  5. Hypertension.  6. Diabetes.  7. Hyperlipidemia.  8. Peripheral vascular disease status post bilateral stenting.   RECOMMENDATIONS:  I think Mr. Winchell is doing well. He had positive  stools in the hospital and is 77 years old and has not had a  colonoscopy, so I think I will plan a GI referral to evaluate that.  Unless he has some increased risk of bleeding from that then we will  recommend restarting Coumadin after that. I will plan to see him back in  followup in 4 months.   ADDENDUM:  We will plan to leave him off of Lasix since he got  dehydrated on it, and he does not have any evidence of volume overload  and has good LV function. We will make a decision about putting him back  on Coumadin after we finish the GI evaluation.  We will get a repeat CBC  with iron studies and ferritin today.  If he has no major risks of  bleeding after the GI studies, we will plan to reinstitute Coumadin  then.  I will see him back after his GI studies.     Bruce Elvera Lennox Juanda Chance, MD, [facility]  Electronically Signed    BRB/MedQ  DD: 10/01/2006  DT: 10/01/2006  Job #: [id number]

## 2011-02-20 NOTE — Assessment & Plan Note (Signed)
[facility]                            CARDIOLOGY OFFICE NOTE   NAME:[name], Justin Lane                       MRN:          [id number]  DATE:10/01/2006                            DOB:          1930-06-28    ADDENDUM:  We will plan to leave him off of Lasix since he got  dehydrated on it, and he does not have any evidence of volume overload  and has good LV function. We will make a decision about putting him back  on Coumadin after we finish the GI evaluation.  We will get a repeat CBC  with iron studies and ferritin today.  If he has no major risks of  bleeding after the GI studies, we will plan to reinstitute Coumadin  then.  I will see him back after his GI studies.     Bruce Elvera Lennox Juanda Chance, MD, [facility]     BRB/MedQ  DD: 10/01/2006  DT: 10/01/2006  Job #: [id number]

## 2011-02-20 NOTE — H&P (Signed)
[name], [name] NO.:  [id number]   MEDICAL RECORD NO.:  [id number]          PATIENT TYPE:  INP   LOCATION:  3742                         FACILITY:  [facility]   PHYSICIAN:  Justin Lane. Eden Emms, MD, FACCDATE OF BIRTH:  08/21/1930   DATE OF ADMISSION:  09/21/2006  DATE OF DISCHARGE:                              HISTORY & PHYSICAL   PRIMARY CARDIOLOGIST:  Justin Lane. Justin Chance, MD, [facility]   PRIMARY CARE PHYSICIAN:  Justin Lane, M.D.   CHIEF COMPLAINT:  Elevated INR and dizziness.   HISTORY OF PRESENT ILLNESS:  Justin Lane is a 77 year old male patient  followed by Dr. Juanda Lane with a history of coronary artery disease, status  post inferior wall myocardial infarction in 1998, status post multiple  percutaneous coronary interventions, with last PCI in 2004 after a non-  ST elevation myocardial infarction with a Taxus drug-eluting stent to  the RCA secondary to in-stent restenosis who was admitted in November of  2007 with paroxysmal atrial fibrillation.  At that time, we was placed  on Coumadin.  He converted to normal sinus rhythm.  He saw Dr. Juanda Lane in  the office later in November, and he was complaining of postural  dizziness.  His Cardizem was stopped, and he was placed on digoxin for  rate control.  The patient had been followed in our Coumadin clinic and  was seen there today.  Venipuncture INR came back at 13, and he was  referred to the emergency room for further evaluation.  He saw Dr.  Hetty Lane recently for his dizziness and nausea.  He was placed on  meclizine 2 days ago.  There have been no other medication changes.  He  denies syncope, but has noted some near syncope.  Denies any chest pain.  He has chronic dyspnea on exertion without any recent changes.   PAST MEDICAL HISTORY:  1. Coronary disease status post inferior wall myocardial function      1998.  Status post multiple percutaneous coronary interventions      with last intervention in 2004 with a  Taxus drug-eluting stent      secondary to non-ST-elevation myocardial function from in-stent      restenosis.  2. Good LV function with an EF of 55-65% at echocardiogram November of      2007.  3. Paroxysmal atrial fibrillation.      a.     Coumadin therapy.      b.     Diltiazem discontinued August 30, 2006 secondary to       postural hypotension.  4. Hypertension.  5. Diabetes mellitus.  6. Hyperlipidemia.  7. Peripheral arterial disease status post bilateral iliac stenting.  8. BPH.  9. Treated hypothyroidism.  10.Renal insufficiency with recent creatinine of 2.   MEDICATIONS:  1. Digoxin 0.125 mg daily.  2. Lipitor 10 mg q.h.s.  3. Lopressor 100 mg b.i.d.  4. Plavix 75 mg daily.  5. Levothyroxine 0.25 mg daily.  6. Iron.  7. Multivitamin.  8. Lasix 40 mg a day.  9. Coumadin 5 mg every day except 7.5 mg on  Monday.  10.Aspirin 81 mg daily.  11.Metformin 1 gram b.i.d.  12.Glyburide 6 mg daily.  13.Flomax 0.4 mg a day.  14.Avodart 0.5 mg daily.   ALLERGIES:  NO KNOWN DRUG ALLERGIES.   SOCIAL HISTORY:  The patient lives outside of [place] with his wife.  Denies any current tobacco or alcohol abuse.   FAMILY HISTORY:  Significant for coronary artery disease.   REVIEW OF SYSTEMS:  Please see HPI.  Denies any fevers, but has had some  chills.  He has had a recent sinus headache.  As noted above, he has  dizziness, as well as nausea, but no vomiting.  He denies chest pain or  shortness of breath, but has had dyspnea on exertion.  This is chronic  without changes.  Denies orthopnea, paroxysmal nocturnal dyspnea, edema,  palpitations, syncope, cough or hemoptysis.  He has had severe syncope.  Denies dysuria, hematuria, bright red blood per rectum, melena or  hematemesis.  He has had some low back pain.  He has had some left  shoulder pain with certain types of movements.  The rest of the review  of systems are negative.   PHYSICAL EXAMINATION:  GENERAL:  He is a  well-nourished, well-developed  elderly male in no acute distress.  Blood pressure lying is 150/49,  pulse of 89, sitting 117/46, pulse 87, standing 112/26 with pulse of  102, temperature is 97.2, respirations 15.  HEENT:  Normocephalic and atraumatic.  Eyes - pupils equal, round and  reactive to light and accommodation.  Extraocular movements intact.  Sclerae are clear.  NECK:  Without JVD.  LYMPHATICS:  Without lymphadenopathy.  Carotids without bruits  bilaterally.  Endocrine without thyromegaly.  CARDIAC:  Normal S1 and S2, distant.  Regular rate and rhythm.  LUNGS:  Clear to auscultation bilaterally without wheezing, rhonchi or  rales.  ABDOMEN:  Soft, nontender, with normoactive bowel sounds.  No  organomegaly.  EXTREMITIES:  Without clubbing, cyanosis or edema.  MUSCULOSKELETAL:  Without spine or CVA tenderness.  NEUROLOGIC:  He is alert and oriented x3.  Cranial nerves II-XII grossly  intact.   ELECTROCARDIOGRAM:  Electrocardiogram reveals sinus rhythm with a heart  rate of 70, rightward axis.  Old inferior MI.  No acute changes.  No  significant change since previous tracing of August 19, 2006.   LABORATORY DATA:  INR today is 13.   IMPRESSION:  1. Supratherapeutic INR without bleeding.  2. Orthostatic hypotension with lightheadedness and nausea.  3. Coronary disease as outlined above.  4. Paroxysmal atrial fibrillation on Coumadin therapy.  5. Good left ventricular function.  6. Hyperlipidemia.  7. Diabetes mellitus.  8. Hypertension.  9. Peripheral arterial disease.  10.Hypothyroidism.  11.Renal insufficiency.   PLAN:  The patient was also interviewed and examined by Justin Lane.  Plan to admit the patient to [facility] for further  evaluation.  Will check labs with CMET, CBC, TSH and digoxin level.  Will give him vitamin K 20 mg subcutaneous now.  Will check an INR later tonight and in the morning.  Will hydrate him with IV fluids.  Will hold   his Lasix for now and adjust his medications further as his blood  pressure dictates.      Justin Newcomer, PA-C      Justin C. Eden Emms, MD, [facility]  Electronically Signed    SW/MEDQ  D:  09/21/2006  T:  09/22/2006  Job:  [id number]   cc:   Justin Silence, MD

## 2011-02-20 NOTE — Discharge Summary (Signed)
Justin Lane, Justin Lane NO.:  [id number]   MEDICAL RECORD NO.:  [id number]          PATIENT TYPE:  INP   LOCATION:  3742                         FACILITY:  [facility]   PHYSICIAN:  Bruce R. Juanda Chance, MD, FACCDATE OF BIRTH:  01-12-1930   DATE OF ADMISSION:  09/21/2006  DATE OF DISCHARGE:  09/23/2006                               DISCHARGE SUMMARY   PRIMARY CARDIOLOGIST:  Everardo Beals. Juanda Chance, MD, [facility].   PRIMARY CARE PHYSICIAN:  Arta Silence, MD.   PRINCIPAL DIAGNOSIS:  Supertherapeutic INR.   SECONDARY DIAGNOSES:  1. Orthostatic hypotension.  2. Prerenal azotemia/acute renal insufficiency.  3. Coronary artery disease.  4. Paroxysmal atrial fibrillation.  5. Hypertension.  6. Hyperlipidemia.  7. Type 2 diabetes mellitus.  8. Peripheral arterial disease status post bilateral iliac stenting.  9. Benign prostatic hypertrophy.  10.Hypothyroidism.   ALLERGIES:  NO KNOWN DRUG ALLERGIES.   PROCEDURE:  None.   HISTORY OF PRESENT ILLNESS:  [age]-year-old male with prior  history of paroxysmal atrial fibrillation on chronic Coumadin therapy  who was seen on September 21, 2006 in [facility]  and was noted to have an INR of 13 by venipuncture.  As a result the  patient was referred to the [facility] Emergency Room for further  evaluation.  In the ER, the patient also complained of some orthostasis  with presyncope and he was initiated on IV fluids.  He was also treated  with vitamin K 20 mg subcutaneously in the ER and admitted for further  evaluation.   HOSPITAL COURSE:  Following admission his creatinine was noted to be  2.9, up from his baseline of 1.3 to 1.6.  IV fluids were continued.  His  Coumadin obviously was held and he did not require any additional  vitamin K.  His INR trended down over the subsequent two days to 1.8  today.  Because of his supertherapeutic INR, we did a guaiac of his  stool and this was positive.  As a result  the decision was made to hold  off on Coumadin therapy for the time being and patient will follow up  with Dr. Charlies Constable on December 28 and will likely require outpatient  GI evaluation prior to re-initiation of Coumadin therapy.  With IV  hydration and discontinuation of Lasix therapy, his creatinine has  improved back down to 1.7 today.  He has not had any additional  orthostasis or complaints of presyncope.  He is being discharged home  today in satisfactory condition.   DISCHARGE LABS:  Hemoglobin 11.3, hematocrit 32.3, WBC 5.9, platelets  216, PT 21.8, INR 1.8, sodium 142, potassium 3.4, chloride 108, CO2 22,  BUN 32, creatinine 1.7, glucose 178, calcium 8.9, CK 76, MB 1.9,  troponin I 0.03, total bilirubin 0.8, alkaline phosphatase 50, AST 30,  ALT 20, total protein 6.8, albumin 3.4, TSH 2.924, digitalis level 1.2.  Fecal occult blood was positive.   DISPOSITION:  Patient is being discharged home today in good condition.   FOLLOWUP APPOINTMENT:  The patient is asked to follow up with Dr.  Lorenza Chick  office on Monday, December 24 for repeat CBC and BMET.  He  has follow up with Dr. Charlies Constable on December 28 at 8:45 a.m.   DISCHARGE MEDICATIONS:  1. Digoxin 0.125 mg daily.  2. Aspirin 81 mg daily.  3. Glucophage as previously prescribed.  4. Lipitor 10 mg daily.  5. Glyburide 6 mg daily.  6. Metoprolol 100 mg b.i.d.  7. Levothyroxine 25 mcg daily.  8. Plavix 75 mg daily.  9. Trental 40 mg t.i.d.  10.Flomax 0.4 mg daily.  11.Avodart 0.5 mg daily.  12.Multivitamin with iron daily.  13.Ferrous sulfate 325 mg b.i.d.  14.Vitamin B12 IM every two weeks as previously prescribed.  His      Coumadin and Lasix have been discontinued for the time being.   OUTSTANDING LAB STUDIES:  None.   DURATION OF DISCHARGE ENCOUNTER:  40 minutes including physician time.      Nicolasa Ducking, ANP      Bruce R. Juanda Chance, MD, [facility]  Electronically Signed    CB/MEDQ  D:   09/23/2006  T:  09/24/2006  Job:  [id number]   cc:   Arta Silence, MD

## 2011-02-20 NOTE — Discharge Summary (Signed)
NAME:  Justin Lane, Justin Lane NO.:  [id number]   MEDICAL RECORD NO.:  [id number]                   PATIENT TYPE:  OIB   LOCATION:  6522                                 FACILITY:  [facility]   PHYSICIAN:  Salvadore Farber, M.D. [facility]         DATE OF BIRTH:  1930-08-05   DATE OF ADMISSION:  04/15/2004  DATE OF DISCHARGE:  04/16/2004                           DISCHARGE SUMMARY - REFERRING   REASON FOR ADMISSION:  Please refer to dictated admission note.   LABORATORY DATA:  Bilateral iliac stenting, April 15, 2004.   HOSPITAL COURSE:  Patient presented for elective lower extremity angiography  for further evaluation of claudication.  Procedure was performed by Dr.  Salvadore Farber, with no noted complications.   Angiography notable for bilateral 70% ostial CIA stenoses, as well as a 70%  mid CIA lesion.  These were both successfully stented to 0% residual  stenosis.   The patient was kept for overnight observation, and cleared for discharge by  Dr. [name], in hemodynamically stable condition.  No noted complications of  both groins were noted on examination.   The patient was instructed to resume all previous home medications,  including Plavix and aspirin, and to refrain from resuming Metformin for an  additional 24 hours.   MEDICATIONS AT DISCHARGE:  1. Plavix 75 mg daily.  2. Coated aspirin 325 mg daily.  3. Lotensin 40 mg daily.  4. Avandia 8 mg daily.  5. Metformin 1000 mg b.i.d.  6. Lipitor 10 mg daily.  7. Glyburide 6 mg as directed.  8. Lopressor 100 mg b.i.d.  9. Levothyroxine 0.025 mg daily.  10.      Trental as previously directed.  11.      Ferrous sulfate 325 mg b.i.d.  12.      Hydrochlorothiazide 12.5 mg daily.   INSTRUCTIONS:  No heavy lifting/driving x2 days; maintain a low-fat/-  cholesterol diet; call the office if there is any swelling/bleeding of both  groins.   FOLLOWUP:  The patient is scheduled to follow up with Dr. Randa Evens on  Monday, May 19, 2004, at 11:15 a.m.   DISCHARGE DIAGNOSES:  1. Peripheral vascular disease.     a. Status post bilateral common iliac stenting, April 15, 2004.  2. Coronary artery disease.  3. Hypertension.  4. Type 2 diabetes mellitus.  5. Dyslipidemia.  6. Hypothyroidism.      Gene Serpe, P.A. [facility]                      Salvadore Farber, M.D. [facility]    GS/MEDQ  D:  04/16/2004  T:  04/16/2004  Job:  [id number]

## 2011-02-20 NOTE — Assessment & Plan Note (Signed)
[facility]                              CARDIOLOGY OFFICE NOTE   NAME:Justin Lane, Justin Lane                       MRN:          [id number]  DATE:08/30/2006                            DOB:          30-Mar-1930    PRIMARY CARE PHYSICIAN:  Dr. [name].   CLINICAL HISTORY:  Justin Lane is 77 years old and was recently hospitalized  with an episode of paroxysmal atrial fibrillation.  He converted to sinus  rhythm and had an echocardiogram in the hospital, which was normal.  He was  put on Cardizem CD 120 and his aspirin was decreased from 325 to 81, and he  was started on Coumadin.  He had an outpatient Myoview scan, which showed no  evidence of ischemia.  His BUN and creatinine in the hospital were 21 and  1.6.   Since discharge, he has done well.  He has had no recurrence of palpitations  and no chest pain.  He says he has been a little bit dizzy when he stands up  and his blood pressure was low __________ .   PAST MEDICAL HISTORY:  Significant for hypertension, diabetes,  hyperlipidemia, and peripheral vascular disease with status post bilateral  ileac stenting.   CURRENT MEDICATIONS:  Include Lipitor, Lopressor, Plavix, levothyroxine,  iron, multivitamins, Lasix, Coumadin, Cartia XL, aspirin, metformin,  glyburide, Flomax, and Avodart.   EXAMINATION:  Blood pressure was 102/52, and the pulse 80 and regular.  There was no venous distension.  The carotid pulses were full and there were  no bruits.  CHEST:  Clear.  HEART:  Rhythm was regular.  I could hear no murmurs or gallops.  ABDOMEN:  Soft with normal bowel sounds.  There was no hepatosplenomegaly.  There was trace 1+ edema with some mild stasis change.  Peripheral pulses were equal.   IMPRESSION:  1. Recent episode of paroxysmal atrial fibrillation, now holding sinus      rhythm on rate control medications and Coumadin.  2. Coronary artery disease status post remote diaphragmatic wall     infarction with multiple percutaneous coronary interventions to the      right coronary artery, the last of which was a Taxus stent for in-stent      restenosis in 2004.  3. Hypertension.  4. Diabetes.  5. Hyperlipidemia.  6. Peripheral vascular disease status post bilateral ileac stenting.  7. Good left ventricular function.   RECOMMENDATIONS:  I think Justin Lane is doing well.  His blood pressure is  quite low and he is having symptoms of postural dizziness.  I think it is  probably related to [place].  Will plan to stop this and will substitute  digoxin 0.125 mg daily.  His creatinine is 1.6.  We will get a followup  BMP and dig levels in about a week.  He is due for a Coumadin check today.  I will see him back in 3 months.     Bruce Elvera Lennox Juanda Chance, MD, [facility]  Electronically Signed    BRB/MedQ  DD: 08/30/2006  DT: 08/30/2006  Job #: [id number]

## 2011-02-25 ENCOUNTER — Encounter: Payer: Self-pay | Admitting: *Deleted

## 2011-02-25 ENCOUNTER — Encounter: Payer: Self-pay | Admitting: Cardiovascular Disease

## 2011-03-03 ENCOUNTER — Ambulatory Visit (INDEPENDENT_AMBULATORY_CARE_PROVIDER_SITE_OTHER): Payer: Medicare Other | Admitting: Cardiovascular Disease

## 2011-03-03 ENCOUNTER — Ambulatory Visit (INDEPENDENT_AMBULATORY_CARE_PROVIDER_SITE_OTHER): Payer: Medicare Other | Admitting: *Deleted

## 2011-03-03 ENCOUNTER — Encounter: Payer: Self-pay | Admitting: Cardiovascular Disease

## 2011-03-03 VITALS — BP 118/58 | HR 87 | Resp 14 | Ht 72.0 in | Wt 215.0 lb

## 2011-03-03 DIAGNOSIS — I498 Other specified cardiac arrhythmias: Secondary | ICD-10-CM

## 2011-03-03 DIAGNOSIS — I2510 Atherosclerotic heart disease of native coronary artery without angina pectoris: Secondary | ICD-10-CM

## 2011-03-03 DIAGNOSIS — I4891 Unspecified atrial fibrillation: Secondary | ICD-10-CM

## 2011-03-03 NOTE — Patient Instructions (Addendum)
Your physician recommends that you schedule a follow-up appointment in: 6 months with Dr. McAlhany.  

## 2011-03-03 NOTE — Assessment & Plan Note (Addendum)
Stable. Continue current therapy. Lipids are followed in primary care.

## 2011-03-03 NOTE — Assessment & Plan Note (Signed)
Sinus today. He is on coumadin chronically. He is going by the coumadin clinic today.

## 2011-03-03 NOTE — Progress Notes (Signed)
History of Present Illness:77 year-old WM with history of CAD, PAF, PVD s/p bilateral iliac artery stenting in past by Dr. [name], HTN, Hyperlipidemia, DM and CRI here today for cardiac follow up. He has been followed in the past by Dr. Juanda Chance.  His cardiac history includes remote inferior MI and more recent stenting of the RCA in 2004. He has been stable from a CV perspective since that time. The patient has paroxysmal atrial fibrillation and is maintained on chronic anticoagulation with coumadin.  He also has chronic renal insufficiency with creatinines in the range of 1.7. He has seen Dr. [name] for this.  He is here today for cardiac follow up. He tells me that he has been diagnosed with Parkinson's Disease. No chest pain or SOB. No palpitations, near syncope or syncope. He has not been aware of any irregularities of his heart rhythm.   Primary care is [facility] Givens. His lipids are followed in primary care.    Past Medical History  Diagnosis Date  . GERD (gastroesophageal reflux disease)   . Myocardial infarction 1997 & 2004  . Thyroid disease     Hypothyroidism  . Anemia     Mild  . Hypertension   . Diabetes mellitus     Goal A1c ~8 due to concern for hypoglycemia  . Chronic kidney disease     Chronic, with a creatinine in the range of 2  . Hyperlipidemia   . PVD (peripheral vascular disease)     Status post bilateral renal stenting, LE's  . Paroxysmal atrial fibrillation     On Coumadin and rate control meds  . CAD (coronary artery disease) 2004    S/P remote diaphragmatic wall infarction and  S/P drug-eluting stent to right coronary artery for in-stent restenosis, now stable.  Good LVF  . Hemorrhoids   . Gastritis 06/24/03 & 12/08/06    EGD Christella Hartigan)  . Hiatal hernia   . Arrhythmia atrial   . Hypopotassemia   . Gout   . Vitamin B 12 deficiency   . BPH (benign prostatic hyperplasia)   . Renal calculus     Recurrent lithotripsy  . Diverticular disease     via ACBE  . Dyspnea     . Herniated disc     L4, L5  . Near syncope 10/07/09  . ARF (acute renal failure) 11/14/09  . Elevated INR     INR 09/18/06  . A-fib     PAfib  . Parkinson's disease     Per Dr. Terrace Arabia  . GERD (gastroesophageal reflux disease)     Past Surgical History  Procedure Date  . Angioplasty 10/97    Multiple   . Lithotripsy 04/29/00    Due to renal lithiasis   . Cardiac catheterization 06/22/03    and PICA for in-stent restenosis  . Iliac vein angioplasty / stenting 05/16/04    Bilateral  . Laminectomy 09/28/07    L4,L5  (Dr. Ophelia Charter)  . Cholecystectomy open 10/23/2009    Attempted lap, purulent GB (Dr. Dwain Sarna)    Current Outpatient Prescriptions  Medication Sig Dispense Refill  . acetaminophen (TYLENOL) 500 MG tablet Take 500 mg by mouth. As needed.      Marland Kitchen amLODipine (NORVASC) 5 MG tablet Take 5 mg by mouth daily.        Marland Kitchen aspirin-acetaminophen-caffeine (EXCEDRIN MIGRAINE) 250-250-65 MG per tablet Take 1 tablet by mouth every 6 (six) hours as needed.        Marland Kitchen atorvastatin (LIPITOR) 80 MG tablet  Take 80 mg by mouth daily.        . carbidopa-levodopa (SINEMET) 25-100 MG per tablet Take 1 tablet by mouth 3 (three) times daily.        . carvedilol (COREG) 25 MG tablet Take 25 mg by mouth 2 (two) times daily with meals.        . Cholecalciferol (VITAMIN D3) 1000 UNITS capsule Take 1,000 Units by mouth daily.        . cilostazol (PLETAL) 100 MG tablet take 1 tablet by mouth twice a day  60 tablet  6  . cyanocobalamin 2000 MCG tablet Take 2,000 mcg by mouth daily.        . cyanocobalamin 500 MCG tablet Take 500 mcg by mouth daily.        . digoxin (LANOXIN) 0.125 MG tablet Take 125 mcg by mouth daily.        Marland Kitchen donepezil (ARICEPT) 10 MG tablet Take 10 mg by mouth at bedtime.        . dutasteride (AVODART) 0.5 MG capsule Take 0.5 mg by mouth daily.        . Ferrous Sulfate (RA IRON) 27 MG TABS Take 1 tablet by mouth daily.        Marland Kitchen glyBURIDE micronized (GLYNASE) 6 MG tablet TAKE 1 TABLET BY  MOUTH TWICE A DAY  60 tablet  11  . insulin aspart (NOVOLOG) 100 UNIT/ML injection Inject 35 Units into the skin 2 (two) times daily.       . insulin detemir (LEVEMIR) 100 UNIT/ML injection Inject 20 Units into the skin daily.       . Insulin Syringe-Needle U-100 31G X 5/16" 0.5 ML MISC by Does not apply route 3 (three) times daily.        Marland Kitchen levothyroxine (SYNTHROID, LEVOTHROID) 25 MCG tablet Take 25 mcg by mouth. Take 1 tablet daily except 2 tablets on Sunday.       . nitroGLYCERIN (NITROSTAT) 0.3 MG SL tablet Place 0.3 mg under the tongue every 5 (five) minutes as needed.        . traMADol (ULTRAM) 50 MG tablet Take 50 mg by mouth. Take 1 tablet every 4 to 6 hours as needed for pain.       Marland Kitchen warfarin (COUMADIN) 5 MG tablet Use as directed by Anticoagulation Clinic  45 tablet  3    Allergies  Allergen Reactions  . Codeine Sulfate     REACTION: nausea and vomiting    History   Social History  . Marital Status: Married    Spouse Name: N/A    Number of Children: 3  . Years of Education: N/A   Occupational History  . Retired, [employer]    Social History Main Topics  . Smoking status: Former Smoker    Quit date: 11/11/1986  . Smokeless tobacco: Not on file   Comment: Quit after MI  . Alcohol Use: No  . Drug Use:   . Sexually Active:    Other Topics Concern  . Not on file   Social History Narrative   Lives with wife    Family History  Problem Relation Age of Onset  . Heart disease Mother   . Cancer Father     ? Skin  . Diabetes Sister   . Heart disease Brother   . Heart disease Brother     Review of Systems:  As stated in the HPI and otherwise negative.   BP 118/58  Pulse 87  Resp 14  Ht 6' (1.829 m)  Wt 215 lb (97.523 kg)  BMI 29.16 kg/m2  Physical Examination: General: Well developed, well nourished, NAD HEENT: OP clear, mucus membranes moist SKIN: warm, dry. No rashes. Neuro: No focal deficits Musculoskeletal: Muscle strength 5/5  all ext Psychiatric: Mood and affect normal Neck: No JVD, no carotid bruits, no thyromegaly, no lymphadenopathy. Lungs:Clear bilaterally, no wheezes, rhonci, crackles Cardiovascular: Regular rate and rhythm. No murmurs, gallops or rubs. Abdomen:Soft. Bowel sounds present. Non-tender.  Extremities: 1+ bilateral  lower extremity edema. Pulses are 21 + in the bilateral DP/PT.  EKG:NSR, rate 87 bpm. Non-specific ST and T wave changes.

## 2011-03-14 ENCOUNTER — Other Ambulatory Visit: Payer: Self-pay | Admitting: Family Medicine

## 2011-03-16 ENCOUNTER — Other Ambulatory Visit: Payer: Self-pay | Admitting: Family Medicine

## 2011-03-16 NOTE — Telephone Encounter (Signed)
Already sent to pharmacy 

## 2011-03-23 ENCOUNTER — Encounter: Payer: Self-pay | Admitting: Family Medicine

## 2011-03-24 ENCOUNTER — Ambulatory Visit (INDEPENDENT_AMBULATORY_CARE_PROVIDER_SITE_OTHER): Payer: Medicare Other | Admitting: *Deleted

## 2011-03-24 DIAGNOSIS — I498 Other specified cardiac arrhythmias: Secondary | ICD-10-CM

## 2011-03-24 LAB — POCT INR: INR: 2.6

## 2011-03-31 ENCOUNTER — Other Ambulatory Visit (INDEPENDENT_AMBULATORY_CARE_PROVIDER_SITE_OTHER): Payer: Medicare Other

## 2011-03-31 DIAGNOSIS — E119 Type 2 diabetes mellitus without complications: Secondary | ICD-10-CM

## 2011-03-31 LAB — COMPREHENSIVE METABOLIC PANEL
ALT: 21 U/L (ref 0–53)
Alkaline Phosphatase: 61 U/L (ref 39–117)
Sodium: 140 mEq/L (ref 135–145)
Total Bilirubin: 0.8 mg/dL (ref 0.3–1.2)
Total Protein: 6.6 g/dL (ref 6.0–8.3)

## 2011-03-31 LAB — LIPID PANEL
Total CHOL/HDL Ratio: 4
VLDL: 56 mg/dL — ABNORMAL HIGH (ref 0.0–40.0)

## 2011-03-31 LAB — LDL CHOLESTEROL, DIRECT: Direct LDL: 60.4 mg/dL

## 2011-04-03 ENCOUNTER — Ambulatory Visit (INDEPENDENT_AMBULATORY_CARE_PROVIDER_SITE_OTHER): Payer: Medicare Other | Admitting: Family Medicine

## 2011-04-03 ENCOUNTER — Encounter: Payer: Self-pay | Admitting: Family Medicine

## 2011-04-03 VITALS — BP 130/64 | HR 86 | Temp 97.8°F | Wt 212.8 lb

## 2011-04-03 DIAGNOSIS — E119 Type 2 diabetes mellitus without complications: Secondary | ICD-10-CM

## 2011-04-03 MED ORDER — INSULIN DETEMIR 100 UNIT/ML ~~LOC~~ SOLN: 22.0000 [IU] | Freq: Every day | SUBCUTANEOUS | Status: DC

## 2011-04-03 NOTE — Progress Notes (Signed)
Tremor and memory loss is improved per patient.  On aricept and sinemet.   Has had f/u with neuro.    Diabetes:  Using medications without difficulties:yes Hypoglycemic episodes:no Hyperglycemic episodes:yes Feet problems:no Blood Sugars averaging: ~200, up with dietary indiscretion.  Labs d/w pt.  Had been using about 20 units of levemir a day and was doing well with the injections themselves.   PMH and SH reviewed  Meds, vitals, and allergies reviewed.   ROS: See HPI.  Otherwise negative.    GEN: nad, alert, mild resting tremor noted HEENT: mucous membranes moist NECK: supple w/o LA CV: IRR, not tachy PULM: ctab, no inc wob ABD: soft, +bs EXT: trace edema SKIN: no acute rash  Diabetic foot exam: Normal inspection No skin breakdown No calluses  Equal DP pulses Mild dec in sensation to light touch and monofilament Nails mildly thickened.

## 2011-04-03 NOTE — Patient Instructions (Addendum)
See if the pharmacy needs paperwork to help pay for the avodart.   Increase the levemir to 22 units a day.  Don't change the novolog.   Recheck labs in 3 months with OV after that.  Take care.

## 2011-04-05 ENCOUNTER — Encounter: Payer: Self-pay | Admitting: Family Medicine

## 2011-04-05 NOTE — Assessment & Plan Note (Signed)
We had lower the glyburide prev.  Inc the levemir to 22 units, continue other meds and recheck labs in 3 months.  He'll work on diet.  He agrees with plan.  Goal to avoid hypoglycemia.  He agrees. >25 min spent with face to face with patient, >50% counseling.

## 2011-04-21 ENCOUNTER — Ambulatory Visit (INDEPENDENT_AMBULATORY_CARE_PROVIDER_SITE_OTHER): Payer: Medicare Other | Admitting: *Deleted

## 2011-04-21 DIAGNOSIS — I498 Other specified cardiac arrhythmias: Secondary | ICD-10-CM

## 2011-04-21 LAB — POCT INR: INR: 2.2

## 2011-05-07 ENCOUNTER — Ambulatory Visit: Payer: Medicare Other | Admitting: Cardiovascular Disease

## 2011-05-19 ENCOUNTER — Ambulatory Visit (INDEPENDENT_AMBULATORY_CARE_PROVIDER_SITE_OTHER): Payer: Medicare Other | Admitting: *Deleted

## 2011-05-19 DIAGNOSIS — I498 Other specified cardiac arrhythmias: Secondary | ICD-10-CM

## 2011-06-11 ENCOUNTER — Other Ambulatory Visit: Payer: Self-pay | Admitting: Family Medicine

## 2011-06-16 ENCOUNTER — Ambulatory Visit (INDEPENDENT_AMBULATORY_CARE_PROVIDER_SITE_OTHER): Payer: Medicare Other | Admitting: *Deleted

## 2011-06-16 DIAGNOSIS — I498 Other specified cardiac arrhythmias: Secondary | ICD-10-CM

## 2011-06-16 LAB — POCT INR: INR: 3.5

## 2011-06-29 ENCOUNTER — Other Ambulatory Visit (INDEPENDENT_AMBULATORY_CARE_PROVIDER_SITE_OTHER): Payer: Medicare Other

## 2011-06-29 DIAGNOSIS — E119 Type 2 diabetes mellitus without complications: Secondary | ICD-10-CM

## 2011-06-29 LAB — HEMOGLOBIN A1C: Hgb A1c MFr Bld: 8.7 % — ABNORMAL HIGH (ref 4.6–6.5)

## 2011-06-30 ENCOUNTER — Other Ambulatory Visit: Payer: Self-pay | Admitting: Family Medicine

## 2011-07-06 ENCOUNTER — Encounter: Payer: Self-pay | Admitting: Family Medicine

## 2011-07-06 ENCOUNTER — Ambulatory Visit (INDEPENDENT_AMBULATORY_CARE_PROVIDER_SITE_OTHER): Payer: Medicare Other | Admitting: Family Medicine

## 2011-07-06 VITALS — BP 116/70 | HR 83 | Temp 97.8°F | Wt 214.1 lb

## 2011-07-06 DIAGNOSIS — G20 Parkinson's disease: Secondary | ICD-10-CM

## 2011-07-06 DIAGNOSIS — I10 Essential (primary) hypertension: Secondary | ICD-10-CM

## 2011-07-06 DIAGNOSIS — E1142 Type 2 diabetes mellitus with diabetic polyneuropathy: Secondary | ICD-10-CM

## 2011-07-06 DIAGNOSIS — Z23 Encounter for immunization: Secondary | ICD-10-CM

## 2011-07-06 DIAGNOSIS — E119 Type 2 diabetes mellitus without complications: Secondary | ICD-10-CM

## 2011-07-06 DIAGNOSIS — E1149 Type 2 diabetes mellitus with other diabetic neurological complication: Secondary | ICD-10-CM

## 2011-07-06 NOTE — Progress Notes (Signed)
Diabetes:  Using medications without difficulties:yes Hypoglycemic episodes:no Hyperglycemic episodes: as below Feet problems:no Blood Sugars averaging: in 200s  Hypertension:    Using medication without problems or lightheadedness: yes Chest pain with exertion: minimal, at baseline Edema: trace Short of breath: at baseline  Parkinson's disease.  Some fatigue.  Compliant with meds.  Tremor at baseline.  Sees Dr. Terrace Arabia in 1/13.  No falls.  Still trying to be as active as possible.    PMH and SH reviewed  Meds, vitals, and allergies reviewed.   ROS: See HPI.  Otherwise negative.    GEN: nad, alert and oriented, pleasant in conversation HEENT: mucous membranes moist NECK: supple w/o LA CV: ectopy noted, but not tachy PULM: ctab, no inc wob ABD: soft, +bs EXT: trace edema SKIN: no acute rash Tremor noted in L hand and jaw  Diabetic foot exam: Normal inspection No skin breakdown Callus noted on lateral MTs 1+ DP pulses Dec sensation to light touch and monofilament B Nails normal

## 2011-07-06 NOTE — Patient Instructions (Addendum)
Come back for labs in 3 months with a OV a few days later.   Increase your levemir to 24 units a day (at lunch).  Don't change your other meds.   Check with your insurance to see if they will cover the shingles shot. Glad to see you.  Take care.

## 2011-07-06 NOTE — Assessment & Plan Note (Signed)
Controlled, continue current meds.   

## 2011-07-06 NOTE — Assessment & Plan Note (Signed)
He appears to be compensation and tolerating meds.  No change in meds.  I will await neuro input.

## 2011-07-06 NOTE — Assessment & Plan Note (Signed)
Tolerated meds.  Will inc levemir slightly; goal A1c ~8.  No hypoglycemia.  He agrees.  Diet d/w pt.  He had been snacking on peanut butter, and this isn't the worst snack for him to have.  He's trying to maintain his level of activity.  Recheck in 3 months, sooner prn.  Flu shot done today.

## 2011-07-07 ENCOUNTER — Ambulatory Visit (INDEPENDENT_AMBULATORY_CARE_PROVIDER_SITE_OTHER): Payer: Medicare Other | Admitting: *Deleted

## 2011-07-07 DIAGNOSIS — I498 Other specified cardiac arrhythmias: Secondary | ICD-10-CM

## 2011-07-07 LAB — POCT INR: INR: 2.8

## 2011-07-10 LAB — COMPREHENSIVE METABOLIC PANEL
ALT: 28
AST: 33
Alkaline Phosphatase: 67
CO2: 20
GFR calc Af Amer: 16 — ABNORMAL LOW
Glucose, Bld: 140 — ABNORMAL HIGH
Potassium: 4.9
Sodium: 139
Total Protein: 7.3

## 2011-07-10 LAB — DIFFERENTIAL
Basophils Relative: 0
Eosinophils Absolute: 0.1 — ABNORMAL LOW
Lymphs Abs: 1.7
Monocytes Absolute: 0.8
Monocytes Relative: 13 — ABNORMAL HIGH

## 2011-07-10 LAB — RENAL FUNCTION PANEL
BUN: 85 — ABNORMAL HIGH
CO2: 21
CO2: 23
Calcium: 10
Calcium: 9.4
Calcium: 9.5
Chloride: 106
GFR calc Af Amer: 26 — ABNORMAL LOW
GFR calc non Af Amer: 22 — ABNORMAL LOW
Glucose, Bld: 115 — ABNORMAL HIGH
Glucose, Bld: 132 — ABNORMAL HIGH
Glucose, Bld: 94
Phosphorus: 4.6
Phosphorus: 5.1 — ABNORMAL HIGH
Potassium: 4.4
Potassium: 4.5
Sodium: 137
Sodium: 138

## 2011-07-10 LAB — TYPE AND SCREEN: Antibody Screen: NEGATIVE

## 2011-07-10 LAB — URINALYSIS, MICROSCOPIC ONLY
Bilirubin Urine: NEGATIVE
Glucose, UA: NEGATIVE
Hgb urine dipstick: NEGATIVE
Ketones, ur: NEGATIVE
pH: 5.5

## 2011-07-10 LAB — PROTIME-INR
INR: 1
INR: 1.1
Prothrombin Time: 12.9
Prothrombin Time: 14

## 2011-07-10 LAB — APTT
aPTT: 27
aPTT: 35

## 2011-07-10 LAB — URINALYSIS, ROUTINE W REFLEX MICROSCOPIC
Bilirubin Urine: NEGATIVE
Hgb urine dipstick: NEGATIVE
Nitrite: NEGATIVE
Specific Gravity, Urine: 1.02
pH: 5.5

## 2011-07-10 LAB — CBC
HCT: 38.8 — ABNORMAL LOW
Hemoglobin: 11.9 — ABNORMAL LOW
Hemoglobin: 12.3 — ABNORMAL LOW
Hemoglobin: 13
Hemoglobin: 13.2
MCHC: 34.7
MCV: 87.4
MCV: 89.2
Platelets: 252
RBC: 3.94 — ABNORMAL LOW
RBC: 4.15 — ABNORMAL LOW
RBC: 4.33
RBC: 4.35
RDW: 15.2
WBC: 6.1
WBC: 6.9
WBC: 8

## 2011-07-10 LAB — BASIC METABOLIC PANEL
BUN: 17
CO2: 27
Calcium: 8.3 — ABNORMAL LOW
Calcium: 9.7
Chloride: 99
Creatinine, Ser: 1.32
Creatinine, Ser: 1.51 — ABNORMAL HIGH
GFR calc Af Amer: 55 — ABNORMAL LOW
GFR calc Af Amer: >60
GFR calc non Af Amer: 45 — ABNORMAL LOW
GFR calc non Af Amer: 53 — ABNORMAL LOW
Sodium: 141

## 2011-07-10 LAB — URINE MICROSCOPIC-ADD ON

## 2011-07-10 LAB — HEMOGLOBIN AND HEMATOCRIT, BLOOD
HCT: 32 — ABNORMAL LOW
Hemoglobin: 10.8 — ABNORMAL LOW

## 2011-07-10 LAB — HEMOGLOBIN A1C
Hgb A1c MFr Bld: 6.9 — ABNORMAL HIGH
Mean Plasma Glucose: 168

## 2011-08-04 ENCOUNTER — Ambulatory Visit (INDEPENDENT_AMBULATORY_CARE_PROVIDER_SITE_OTHER): Payer: Medicare Other | Admitting: *Deleted

## 2011-08-04 DIAGNOSIS — I498 Other specified cardiac arrhythmias: Secondary | ICD-10-CM

## 2011-08-11 ENCOUNTER — Ambulatory Visit (INDEPENDENT_AMBULATORY_CARE_PROVIDER_SITE_OTHER): Payer: Medicare Other | Admitting: *Deleted

## 2011-08-11 DIAGNOSIS — I498 Other specified cardiac arrhythmias: Secondary | ICD-10-CM

## 2011-08-24 ENCOUNTER — Other Ambulatory Visit: Payer: Self-pay | Admitting: *Deleted

## 2011-08-24 MED ORDER — WARFARIN SODIUM 5 MG PO TABS: ORAL_TABLET | ORAL | Status: DC

## 2011-08-25 ENCOUNTER — Ambulatory Visit (INDEPENDENT_AMBULATORY_CARE_PROVIDER_SITE_OTHER): Payer: Medicare Other | Admitting: *Deleted

## 2011-08-25 DIAGNOSIS — I498 Other specified cardiac arrhythmias: Secondary | ICD-10-CM

## 2011-08-25 LAB — POCT INR: INR: 3.5

## 2011-09-08 ENCOUNTER — Ambulatory Visit (INDEPENDENT_AMBULATORY_CARE_PROVIDER_SITE_OTHER): Payer: Medicare Other | Admitting: *Deleted

## 2011-09-08 DIAGNOSIS — I498 Other specified cardiac arrhythmias: Secondary | ICD-10-CM

## 2011-09-15 LAB — HM DIABETES EYE EXAM

## 2011-09-21 ENCOUNTER — Other Ambulatory Visit: Payer: Self-pay | Admitting: Family Medicine

## 2011-09-22 ENCOUNTER — Other Ambulatory Visit: Payer: Self-pay | Admitting: *Deleted

## 2011-09-22 MED ORDER — DIGOXIN 125 MCG PO TABS: 125.0000 ug | ORAL_TABLET | Freq: Every day | ORAL | Status: DC

## 2011-09-24 ENCOUNTER — Telehealth: Payer: Self-pay | Admitting: Cardiovascular Disease

## 2011-09-24 NOTE — Telephone Encounter (Signed)
All Cardiac faxed to [facility] @ [phone] 09/24/11/km

## 2011-10-01 ENCOUNTER — Encounter: Payer: Self-pay | Admitting: Family Medicine

## 2011-10-02 ENCOUNTER — Other Ambulatory Visit: Payer: Self-pay | Admitting: Family Medicine

## 2011-10-05 ENCOUNTER — Other Ambulatory Visit (INDEPENDENT_AMBULATORY_CARE_PROVIDER_SITE_OTHER): Payer: Medicare Other | Admitting: *Deleted

## 2011-10-05 ENCOUNTER — Ambulatory Visit (INDEPENDENT_AMBULATORY_CARE_PROVIDER_SITE_OTHER): Payer: Medicare Other | Admitting: *Deleted

## 2011-10-05 ENCOUNTER — Other Ambulatory Visit: Payer: Medicare Other

## 2011-10-05 DIAGNOSIS — I498 Other specified cardiac arrhythmias: Secondary | ICD-10-CM

## 2011-10-05 DIAGNOSIS — E119 Type 2 diabetes mellitus without complications: Secondary | ICD-10-CM

## 2011-10-05 LAB — POCT INR: INR: 3.5

## 2011-10-07 ENCOUNTER — Ambulatory Visit: Payer: Medicare Other | Admitting: Family Medicine

## 2011-10-20 ENCOUNTER — Encounter: Payer: Self-pay | Admitting: Family Medicine

## 2011-10-20 ENCOUNTER — Ambulatory Visit (INDEPENDENT_AMBULATORY_CARE_PROVIDER_SITE_OTHER): Payer: Medicare Other | Admitting: Family Medicine

## 2011-10-20 VITALS — BP 136/50 | HR 81 | Temp 97.8°F | Wt 215.8 lb

## 2011-10-20 DIAGNOSIS — E1142 Type 2 diabetes mellitus with diabetic polyneuropathy: Secondary | ICD-10-CM

## 2011-10-20 DIAGNOSIS — E119 Type 2 diabetes mellitus without complications: Secondary | ICD-10-CM

## 2011-10-20 DIAGNOSIS — G20 Parkinson's disease: Secondary | ICD-10-CM

## 2011-10-20 DIAGNOSIS — E1149 Type 2 diabetes mellitus with other diabetic neurological complication: Secondary | ICD-10-CM

## 2011-10-20 NOTE — Patient Instructions (Signed)
Recheck A1c in 5/13 with a visit a few days later.  Take care.  Don't change your meds.  Call me if you need me.

## 2011-10-20 NOTE — Progress Notes (Signed)
Diabetes:  Using medications without difficulties:yes Hypoglycemic episodes: no Hyperglycemic episodes: occ Feet problems: occ dec in sensation   Blood Sugars averaging: <200 eye exam within last year: yes  Tremor at baseline, continues on meds per neuro.  No recent changes other than some fatigue.  He did have his R cataract surgery and this went well.  PMH and SH reviewed  Meds, vitals, and allergies reviewed.   ROS: See HPI.  Otherwise negative.    GEN: nad, alert and oriented, affect at baseline HEENT: mucous membranes moist NECK: supple w/o LA CV: not tachy, occ ectopy noted PULM: ctab, no inc wob ABD: soft, +bs EXT: trace edema SKIN: no acute rash  Diabetic foot exam: Normal inspection No skin breakdown calluses noted on balls of feet 1+ DP pulses Slight dec in sensation to light touch and monofilament Nails normal

## 2011-10-21 ENCOUNTER — Encounter: Payer: Self-pay | Admitting: Family Medicine

## 2011-10-21 NOTE — Assessment & Plan Note (Signed)
Per neuro 

## 2011-10-21 NOTE — Assessment & Plan Note (Signed)
Goal A1c 8 due to risk of hypoglycemia.  A1c at goal now, no change in meds.  Continue on DM2 diet.  >25 min spent with face to face with patient, >50% counseling and/or coordinating care.

## 2011-10-23 ENCOUNTER — Encounter: Payer: Medicare Other | Admitting: *Deleted

## 2011-10-27 ENCOUNTER — Ambulatory Visit (INDEPENDENT_AMBULATORY_CARE_PROVIDER_SITE_OTHER): Payer: Medicare Other | Admitting: *Deleted

## 2011-10-27 DIAGNOSIS — I498 Other specified cardiac arrhythmias: Secondary | ICD-10-CM

## 2011-11-05 ENCOUNTER — Other Ambulatory Visit: Payer: Self-pay | Admitting: Family Medicine

## 2011-11-17 ENCOUNTER — Ambulatory Visit (INDEPENDENT_AMBULATORY_CARE_PROVIDER_SITE_OTHER): Payer: Medicare Other

## 2011-11-17 DIAGNOSIS — I498 Other specified cardiac arrhythmias: Secondary | ICD-10-CM

## 2011-12-07 ENCOUNTER — Encounter: Payer: Self-pay | Admitting: Family Medicine

## 2011-12-15 ENCOUNTER — Ambulatory Visit (INDEPENDENT_AMBULATORY_CARE_PROVIDER_SITE_OTHER): Payer: Medicare Other

## 2011-12-15 DIAGNOSIS — I498 Other specified cardiac arrhythmias: Secondary | ICD-10-CM

## 2011-12-22 ENCOUNTER — Other Ambulatory Visit: Payer: Self-pay | Admitting: Family Medicine

## 2012-01-12 ENCOUNTER — Ambulatory Visit (INDEPENDENT_AMBULATORY_CARE_PROVIDER_SITE_OTHER): Payer: Medicare Other | Admitting: *Deleted

## 2012-01-12 DIAGNOSIS — I498 Other specified cardiac arrhythmias: Secondary | ICD-10-CM

## 2012-02-11 ENCOUNTER — Other Ambulatory Visit (INDEPENDENT_AMBULATORY_CARE_PROVIDER_SITE_OTHER): Payer: Medicare Other

## 2012-02-11 DIAGNOSIS — E119 Type 2 diabetes mellitus without complications: Secondary | ICD-10-CM

## 2012-02-15 ENCOUNTER — Ambulatory Visit: Payer: Medicare Other | Admitting: Family Medicine

## 2012-02-17 ENCOUNTER — Encounter: Payer: Self-pay | Admitting: Family Medicine

## 2012-02-18 ENCOUNTER — Ambulatory Visit (INDEPENDENT_AMBULATORY_CARE_PROVIDER_SITE_OTHER): Payer: Medicare Other | Admitting: Family Medicine

## 2012-02-18 ENCOUNTER — Encounter: Payer: Self-pay | Admitting: Family Medicine

## 2012-02-18 VITALS — BP 110/50 | HR 75 | Temp 97.3°F | Wt 210.0 lb

## 2012-02-18 DIAGNOSIS — E1142 Type 2 diabetes mellitus with diabetic polyneuropathy: Secondary | ICD-10-CM

## 2012-02-18 DIAGNOSIS — E119 Type 2 diabetes mellitus without complications: Secondary | ICD-10-CM

## 2012-02-18 DIAGNOSIS — E1149 Type 2 diabetes mellitus with other diabetic neurological complication: Secondary | ICD-10-CM

## 2012-02-18 MED ORDER — INSULIN DETEMIR 100 UNIT/ML ~~LOC~~ SOLN: 28.0000 [IU] | Freq: Every day | SUBCUTANEOUS | Status: DC

## 2012-02-18 NOTE — Assessment & Plan Note (Signed)
No recent low sugars.  Will inc levemir by 3 units and he'll notify me of any lows/highs, if significant.  Stop glyburide.  Okay to recheck in ~4 months.  He agrees.  D/w pt about diet.  Goal A1c ~8.

## 2012-02-18 NOTE — Progress Notes (Signed)
He saw Dr. [name] recently.  See renal notes.   Still with some fatigue.  Tremor is slightly worse at the jaw and on the L side now.  Has f/u with neuro next month.    Diabetes:  Using medications without difficulties:yes Hypoglycemic episodes:no Hyperglycemic episodes: yes, often >200 but none >300.  Feet problems: some edemas but no acute changes Blood Sugars averaging: as above A1c 8.6.  He's off glyburide.  His appetite is slightly decreased.  We discussed diet.   We talked about labs.   Meds, vitals, and allergies reviewed.   ROS: See HPI.  Otherwise negative.    GEN: nad, alert and oriented. Tremor noted HEENT: mucous membranes moist NECK: supple w/o LA CV: rrr on exam today PULM: ctab, no inc wob ABD: soft, +bs EXT: trace edema SKIN: no acute rash  Diabetic foot exam: Normal inspection No skin breakdown No calluses  Normal DP pulses Dec in sensation to light touch and monofilament Nails normal

## 2012-02-18 NOTE — Patient Instructions (Addendum)
Stop the glyburide.  Take 28 units of levemir a day and let me know your sugars get higher.   Recheck A1c in September and then come see me after that.   Glad to see you. Take care.

## 2012-02-19 ENCOUNTER — Other Ambulatory Visit: Payer: Self-pay | Admitting: *Deleted

## 2012-02-19 NOTE — Telephone Encounter (Signed)
Faxed refill request.  This medication was not on the patient's current meds list.  Added for refill request.  Please advise.

## 2012-02-19 NOTE — Telephone Encounter (Signed)
Glyburide was stopped at last OV.  Thanks.  Rx denied.

## 2012-02-23 ENCOUNTER — Ambulatory Visit (INDEPENDENT_AMBULATORY_CARE_PROVIDER_SITE_OTHER): Payer: Medicare Other | Admitting: Pharmacist

## 2012-02-23 DIAGNOSIS — I498 Other specified cardiac arrhythmias: Secondary | ICD-10-CM

## 2012-03-14 ENCOUNTER — Other Ambulatory Visit: Payer: Self-pay | Admitting: *Deleted

## 2012-03-14 MED ORDER — WARFARIN SODIUM 5 MG PO TABS: ORAL_TABLET | ORAL | Status: DC

## 2012-03-29 ENCOUNTER — Ambulatory Visit (INDEPENDENT_AMBULATORY_CARE_PROVIDER_SITE_OTHER): Payer: Medicare Other | Admitting: Pharmacist

## 2012-03-29 DIAGNOSIS — I498 Other specified cardiac arrhythmias: Secondary | ICD-10-CM

## 2012-04-25 ENCOUNTER — Other Ambulatory Visit: Payer: Self-pay | Admitting: Family Medicine

## 2012-04-25 ENCOUNTER — Telehealth: Payer: Self-pay | Admitting: Family Medicine

## 2012-04-25 NOTE — Telephone Encounter (Signed)
Pt's son is wanting to know if they could get an RX for a lift chair for Mr. Rowand if possible. His son said he would come by and pick it up, his name is [employer]

## 2012-04-25 NOTE — Telephone Encounter (Signed)
Received refill request electronically from pharmacy. Medication dose does not match the medication sheet. Please advise.

## 2012-04-26 ENCOUNTER — Ambulatory Visit (INDEPENDENT_AMBULATORY_CARE_PROVIDER_SITE_OTHER): Payer: Medicare Other | Admitting: *Deleted

## 2012-04-26 ENCOUNTER — Other Ambulatory Visit: Payer: Self-pay | Admitting: *Deleted

## 2012-04-26 DIAGNOSIS — I498 Other specified cardiac arrhythmias: Secondary | ICD-10-CM

## 2012-04-26 DIAGNOSIS — E039 Hypothyroidism, unspecified: Secondary | ICD-10-CM

## 2012-04-26 MED ORDER — LEVOTHYROXINE SODIUM 25 MCG PO TABS: 25.0000 ug | ORAL_TABLET | Freq: Every day | ORAL | Status: DC

## 2012-04-26 MED ORDER — WARFARIN SODIUM 5 MG PO TABS: ORAL_TABLET | ORAL | Status: DC

## 2012-04-26 NOTE — Telephone Encounter (Signed)
I would be hesitant to do this.  It patient uses a lift chair, he'll likely get weaker and more dependent on the lift mechanism.  That would put him at higher risk for falls.  I would try putting a pad/pillow in the seat to raise the height slightly.  That should make it easier to get up.

## 2012-04-26 NOTE — Telephone Encounter (Signed)
rx sent.  Would recheck with next A1c.  TSH ordered.  Thanks.

## 2012-04-26 NOTE — Telephone Encounter (Signed)
Corrected, sent.

## 2012-04-26 NOTE — Telephone Encounter (Signed)
Received faxed refill request from pharmacy. Please advise when patient needs to have TSH checked again?

## 2012-04-26 NOTE — Telephone Encounter (Signed)
LMOVM

## 2012-05-10 ENCOUNTER — Ambulatory Visit (INDEPENDENT_AMBULATORY_CARE_PROVIDER_SITE_OTHER): Payer: Medicare Other

## 2012-05-10 DIAGNOSIS — I498 Other specified cardiac arrhythmias: Secondary | ICD-10-CM

## 2012-05-10 LAB — POCT INR: INR: 1.4

## 2012-05-23 ENCOUNTER — Ambulatory Visit (INDEPENDENT_AMBULATORY_CARE_PROVIDER_SITE_OTHER): Payer: Medicare Other | Admitting: *Deleted

## 2012-05-23 ENCOUNTER — Encounter: Payer: Self-pay | Admitting: Cardiovascular Disease

## 2012-05-23 ENCOUNTER — Ambulatory Visit (INDEPENDENT_AMBULATORY_CARE_PROVIDER_SITE_OTHER): Payer: Medicare Other | Admitting: Cardiovascular Disease

## 2012-05-23 VITALS — BP 122/64 | HR 86 | Ht 72.0 in | Wt 206.0 lb

## 2012-05-23 DIAGNOSIS — I4891 Unspecified atrial fibrillation: Secondary | ICD-10-CM

## 2012-05-23 DIAGNOSIS — I498 Other specified cardiac arrhythmias: Secondary | ICD-10-CM

## 2012-05-23 MED ORDER — DIGOXIN 125 MCG PO TABS: 125.0000 ug | ORAL_TABLET | Freq: Every day | ORAL | Status: DC

## 2012-05-23 MED ORDER — NITROGLYCERIN 0.3 MG SL SUBL: 0.3000 mg | SUBLINGUAL_TABLET | SUBLINGUAL | Status: DC | PRN

## 2012-05-23 NOTE — Progress Notes (Signed)
History of Present Illness:78 yo WM with history of CAD, PAF, PVD s/p bilateral iliac artery stenting in past by Dr. [name], HTN, Hyperlipidemia, DM, Parkinsons disease and CRI here today for cardiac follow up. He has been followed in the past by Dr. Juanda Chance. His cardiac history includes remote inferior MI and more recent stenting of the RCA in 2004. He has been stable from a CV perspective since that time. The patient has paroxysmal atrial fibrillation and is maintained on chronic anticoagulation with coumadin. He also has chronic renal insufficiency with creatinines in the range of 1.7. He has seen Dr. [name] for this.   He is here today for cardiac follow up.  No chest pain or SOB. No palpitations, near syncope or syncope. He has not been aware of any irregularities of his heart rhythm. He reports muscle rigidity  Primary care is Crawford Givens. His lipids are followed in primary care.   Lipid Profile:  Lipid Panel     Component Value Date/Time   CHOL 146 03/31/2011 0841   TRIG 280.0* 03/31/2011 0841   HDL 41.70 03/31/2011 0841   CHOLHDL 4 03/31/2011 0841   VLDL 56.0* 03/31/2011 0841   LDLCALC 83 10/14/2007 0932     Past Medical History  Diagnosis Date  . GERD (gastroesophageal reflux disease)   . Myocardial infarction 1997 & 2004  . Thyroid disease     Hypothyroidism  . Anemia     Mild  . Hypertension   . Diabetes mellitus     Goal A1c ~8 due to concern for hypoglycemia  . Hyperlipidemia   . PVD (peripheral vascular disease)     Status post bilateral renal stenting, LE's  . Paroxysmal atrial fibrillation     On Coumadin and rate control meds  . CAD (coronary artery disease) 2004    S/P remote diaphragmatic wall infarction and  S/P drug-eluting stent to right coronary artery for in-stent restenosis, now stable.  Good LVF  . Hemorrhoids   . Gastritis 06/24/03 & 12/08/06    EGD Christella Hartigan)  . Hiatal hernia   . Arrhythmia atrial   . Hypopotassemia   . Gout   . Vitamin B 12 deficiency    . BPH (benign prostatic hyperplasia)   . Diverticular disease     via ACBE  . Dyspnea   . Herniated disc     L4, L5  . Near syncope 10/07/09  . Elevated INR     INR 09/18/06  . A-fib     PAfib  . Parkinson's disease     Per Dr. Terrace Arabia  . GERD (gastroesophageal reflux disease)   . Chronic kidney disease     Chronic, with a creatinine in the range of 1.2-1.6  . Renal calculus     Recurrent lithotripsy  . ARF (acute renal failure) 11/14/09    Past Surgical History  Procedure Date  . Angioplasty 10/97    Multiple   . Lithotripsy 04/29/00    Due to renal lithiasis   . Cardiac catheterization 06/22/03    and PICA for in-stent restenosis  . Iliac vein angioplasty / stenting 05/16/04    Bilateral  . Laminectomy 09/28/07    L4,L5  (Dr. Ophelia Charter)  . Cholecystectomy open 10/23/2009    Attempted lap, purulent GB (Dr. Dwain Sarna)  . Cataract extraction     right eye 2013    Current Outpatient Prescriptions  Medication Sig Dispense Refill  . acetaminophen (TYLENOL) 500 MG tablet Take 500 mg by mouth. As needed.      Marland Kitchen  amLODipine (NORVASC) 5 MG tablet TAKE 1 TABLET BY MOUTH ONCE DAILY  30 tablet  9  . aspirin 81 MG tablet Take 160 mg by mouth daily.        Marland Kitchen atorvastatin (LIPITOR) 80 MG tablet take 1 tablet by mouth every evening  30 tablet  12  . AVODART 0.5 MG capsule TAKE 1 CAPSULE BY MOUTH ONCE DAILY  30 capsule  12  . B-D INS SYRINGE 0.5CC/31GX5/16 31G X 5/16" 0.5 ML MISC use as directed twice a day  100 each  11  . carbidopa-levodopa (SINEMET) 25-100 MG per tablet Take 1 tablet by mouth 3 (three) times daily.        . carvedilol (COREG) 25 MG tablet Take 25 mg by mouth 2 (two) times daily with meals.        . Cholecalciferol (VITAMIN D3) 1000 UNITS capsule Take 1,000 Units by mouth daily.        . cilostazol (PLETAL) 100 MG tablet take 1 tablet by mouth twice a day  60 tablet  6  . cyanocobalamin 500 MCG tablet Take 1,000 mcg by mouth daily.       . digoxin (LANOXIN) 0.125 MG  tablet Take 1 tablet (125 mcg total) by mouth daily.  30 tablet  5  . donepezil (ARICEPT) 10 MG tablet Take 10 mg by mouth at bedtime.        . Ferrous Sulfate (RA IRON) 27 MG TABS Take 1 tablet by mouth daily.        . insulin detemir (LEVEMIR) 100 UNIT/ML injection Inject 28 Units into the skin daily.      . insulin detemir (LEVEMIR) 100 UNIT/ML injection Inject 28 units subcutaneously once daily  10 mL  12  . levothyroxine (SYNTHROID, LEVOTHROID) 25 MCG tablet Take 1 tablet (25 mcg total) by mouth daily.  30 tablet  12  . nitroGLYCERIN (NITROSTAT) 0.3 MG SL tablet Place 0.3 mg under the tongue every 5 (five) minutes as needed.        Marland Kitchen NOVOLOG 100 UNIT/ML injection 35 UNITS EVERY MORNING AND 35 UNITS EVERY PM  30 mL  11  . warfarin (COUMADIN) 5 MG tablet Use as directed by Anticoagulation Clinic  45 tablet  3    Allergies  Allergen Reactions  . Codeine Sulfate     REACTION: nausea and vomiting    History   Social History  . Marital Status: Married    Spouse Name: N/A    Number of Children: 3  . Years of Education: N/A   Occupational History  . Retired, [employer]    Social History Main Topics  . Smoking status: Former Smoker    Quit date: 11/11/1986  . Smokeless tobacco: Not on file   Comment: Quit after MI  . Alcohol Use: No  . Drug Use:   . Sexually Active:    Other Topics Concern  . Not on file   Social History Narrative   Lives with wife    Family History  Problem Relation Age of Onset  . Heart disease Mother   . Cancer Father     ? Skin  . Diabetes Sister   . Heart disease Brother   . Heart disease Brother     Review of Systems:  As stated in the HPI and otherwise negative.   BP 122/64  Pulse 86  Ht 6' (1.829 m)  Wt 206 lb (93.441 kg)  BMI 27.94 kg/m2  Physical Examination: General:  Well developed, well nourished, NAD. Tremor.  HEENT: OP clear, mucus membranes moist SKIN: warm, dry. No rashes. Neuro: No focal  deficits Musculoskeletal: Muscle strength 5/5 all ext Psychiatric: Mood and affect normal Neck: No JVD, no carotid bruits, no thyromegaly, no lymphadenopathy. Lungs:Clear bilaterally, no wheezes, rhonci, crackles Cardiovascular: Regular rate and rhythm. No murmurs, gallops or rubs. Abdomen:Soft. Bowel sounds present. Non-tender.  Extremities: No lower extremity edema. Pulses are trace in the bilateral DP/PT.  EKG: NSR, rate 82 bpm. Poor R wave progression.

## 2012-05-23 NOTE — Assessment & Plan Note (Signed)
Stable No changes 

## 2012-05-23 NOTE — Assessment & Plan Note (Signed)
Sinus today. Continue current meds. He is on coumadin. INR checked today.

## 2012-05-23 NOTE — Patient Instructions (Addendum)
Your physician wants you to follow-up in:  6 months. You will receive a reminder letter in the mail two months in advance. If you don't receive a letter, please call our office to schedule the follow-up appointment.   

## 2012-05-25 ENCOUNTER — Other Ambulatory Visit: Payer: Self-pay | Admitting: Family Medicine

## 2012-06-02 ENCOUNTER — Ambulatory Visit (INDEPENDENT_AMBULATORY_CARE_PROVIDER_SITE_OTHER): Payer: Medicare Other | Admitting: *Deleted

## 2012-06-02 DIAGNOSIS — I498 Other specified cardiac arrhythmias: Secondary | ICD-10-CM

## 2012-06-02 LAB — POCT INR: INR: 1.6

## 2012-06-16 ENCOUNTER — Ambulatory Visit (INDEPENDENT_AMBULATORY_CARE_PROVIDER_SITE_OTHER): Payer: Medicare Other | Admitting: *Deleted

## 2012-06-16 DIAGNOSIS — I498 Other specified cardiac arrhythmias: Secondary | ICD-10-CM

## 2012-06-16 LAB — POCT INR: INR: 2.4

## 2012-06-20 ENCOUNTER — Other Ambulatory Visit (INDEPENDENT_AMBULATORY_CARE_PROVIDER_SITE_OTHER): Payer: Medicare Other

## 2012-06-20 DIAGNOSIS — E039 Hypothyroidism, unspecified: Secondary | ICD-10-CM

## 2012-06-20 DIAGNOSIS — E119 Type 2 diabetes mellitus without complications: Secondary | ICD-10-CM

## 2012-06-20 LAB — TSH: TSH: 5.75 u[IU]/mL — ABNORMAL HIGH (ref 0.35–5.50)

## 2012-06-24 ENCOUNTER — Encounter: Payer: Self-pay | Admitting: Family Medicine

## 2012-06-24 ENCOUNTER — Ambulatory Visit (INDEPENDENT_AMBULATORY_CARE_PROVIDER_SITE_OTHER): Payer: Medicare Other | Admitting: Family Medicine

## 2012-06-24 VITALS — BP 130/56 | HR 86 | Temp 97.5°F | Wt 213.0 lb

## 2012-06-24 DIAGNOSIS — E039 Hypothyroidism, unspecified: Secondary | ICD-10-CM

## 2012-06-24 DIAGNOSIS — Z23 Encounter for immunization: Secondary | ICD-10-CM

## 2012-06-24 DIAGNOSIS — G988 Other disorders of nervous system: Secondary | ICD-10-CM

## 2012-06-24 DIAGNOSIS — I498 Other specified cardiac arrhythmias: Secondary | ICD-10-CM

## 2012-06-24 DIAGNOSIS — E1142 Type 2 diabetes mellitus with diabetic polyneuropathy: Secondary | ICD-10-CM

## 2012-06-24 DIAGNOSIS — E1149 Type 2 diabetes mellitus with other diabetic neurological complication: Secondary | ICD-10-CM

## 2012-06-24 NOTE — Patient Instructions (Addendum)
Recheck labs in 6 months before a 30 min visit.  If you have problems with low sugars, then cut back your levemir by 1 unit a day until you don't have any more lows. Take care. Glad to see you.

## 2012-06-26 NOTE — Assessment & Plan Note (Signed)
Continue as is, will dec meds if lows continue.  He'll taper insulin as needed.

## 2012-06-26 NOTE — Progress Notes (Signed)
AF- no CP, no inc in SOB, no palpitations.  Compliant with meds.   DM2.  Rare lows.  Usually ~130 in AM.  Compliant with meds.  A1c improved, discussed.    Tremor, followed by neuro. No recent falls. Using a cane.  Jaw tremor most bothersome.  Swallowing okay.   Hypothyroid.  Compliant with meds.  No dysphagia.  TSH mildly increased.    Meds, vitals, and allergies reviewed.   ROS: See HPI.  Otherwise, noncontributory.  nad ncat Flat affect, at baseline Mmm Rrr, not tachy, no ectopy noted ctab abd soft not ttp Ext with 1+ edema Tremor noted in hands, jaw

## 2012-06-26 NOTE — Assessment & Plan Note (Signed)
Termor tolerable, continue current meds per neuro.

## 2012-06-26 NOTE — Assessment & Plan Note (Signed)
Continue current meds, will recheck TSH periodically.

## 2012-06-26 NOTE — Assessment & Plan Note (Signed)
Sounds to be RRR today, continue as is.  

## 2012-07-05 ENCOUNTER — Ambulatory Visit (INDEPENDENT_AMBULATORY_CARE_PROVIDER_SITE_OTHER): Payer: Medicare Other

## 2012-07-05 DIAGNOSIS — I498 Other specified cardiac arrhythmias: Secondary | ICD-10-CM

## 2012-07-18 ENCOUNTER — Encounter: Payer: Self-pay | Admitting: Family Medicine

## 2012-07-18 ENCOUNTER — Ambulatory Visit (INDEPENDENT_AMBULATORY_CARE_PROVIDER_SITE_OTHER): Payer: Medicare Other | Admitting: Family Medicine

## 2012-07-18 VITALS — BP 108/40 | HR 82 | Temp 97.4°F | Wt 212.0 lb

## 2012-07-18 DIAGNOSIS — R609 Edema, unspecified: Secondary | ICD-10-CM

## 2012-07-18 DIAGNOSIS — I4891 Unspecified atrial fibrillation: Secondary | ICD-10-CM

## 2012-07-18 LAB — POCT INR: INR: 3.2

## 2012-07-18 MED ORDER — FUROSEMIDE 20 MG PO TABS: 20.0000 mg | ORAL_TABLET | Freq: Every day | ORAL | Status: DC | PRN

## 2012-07-18 NOTE — Patient Instructions (Signed)
Take the lasix once a day.  Avoid salt.  Call back with an update in a few days- leave me a message.  Take care.   Go to the lab on the way out.  We'll contact you with your lab report.

## 2012-07-18 NOTE — Progress Notes (Signed)
Inc in BLE in last few weeks.  Not more SOB. No salt loading. No chest pain. No med changes.  No orthopnea.  H/o BLE edema, but slightly worse in meantime.   Needs INR done.  Due for check tomorrow at coumadin clinic.  No bleeding or ADE.   Meds, vitals, and allergies reviewed.   ROS: See HPI.  Otherwise, noncontributory.  nad ncat Tremor at baselin rrr Ctab, no dec in BS at the bases Ext with 1-2+ pitting BLE  DP pulses intact

## 2012-07-19 ENCOUNTER — Ambulatory Visit: Payer: Self-pay | Admitting: Cardiology

## 2012-07-19 DIAGNOSIS — R609 Edema, unspecified: Secondary | ICD-10-CM | POA: Insufficient documentation

## 2012-07-19 DIAGNOSIS — I498 Other specified cardiac arrhythmias: Secondary | ICD-10-CM

## 2012-07-19 LAB — BASIC METABOLIC PANEL
BUN: 30 mg/dL — ABNORMAL HIGH (ref 6–23)
CO2: 25 mEq/L (ref 19–32)
Chloride: 103 mEq/L (ref 96–112)
Creatinine, Ser: 1.4 mg/dL (ref 0.4–1.5)

## 2012-07-19 NOTE — Assessment & Plan Note (Signed)
Will forward INR result to coumadin clinic.

## 2012-07-19 NOTE — Assessment & Plan Note (Signed)
Check labs today, start PRN lasix and follow clinically.  Avoid salt.  Lungs clear and no CP, okay for outpatient f/u.  They'll call back with update.

## 2012-07-25 ENCOUNTER — Telehealth: Payer: Self-pay | Admitting: *Deleted

## 2012-07-25 MED ORDER — FUROSEMIDE 20 MG PO TABS: ORAL_TABLET | ORAL | Status: DC

## 2012-07-25 NOTE — Telephone Encounter (Signed)
Try the inc dose of lasix first, call back with update.  If not improved, we can set up the stockings with rx.  Thanks.

## 2012-07-25 NOTE — Telephone Encounter (Signed)
pts daughter Marchelle Folks left v/m that if pt is to double Lasix needs rx sent to pharmacy. Pt will not have enough to last 30 days. Marchelle Folks also asked if needed rx for compression hose. I tried to contact Marchelle Folks to find out which pharmacy and left v/m for her to call back.Please advise.

## 2012-07-25 NOTE — Telephone Encounter (Signed)
Did patient have inc in UOP after taking the lasix?  If not, then take 2 pills at a time and see if that has any effect.  If he did have increased UOP but the edema didn't change, we'll need to try to get him compression stockings.  Let me know if they need rx for compression stockings.  Please adjust the sig in EMR for lasix if needed.

## 2012-07-25 NOTE — Telephone Encounter (Signed)
Daughter advised.  She says he has noticed a little bit of inc in UOP but not a great deal.  According to instructions below, he was advised to take 2 pills at a time and call back in a few days to give an update.  EMR meds list updated.

## 2012-07-25 NOTE — Telephone Encounter (Signed)
Pt was told to call back with update on swelling in legs and feet with new med. Daughter says pt's legs and feet are no better since he was seen.

## 2012-07-25 NOTE — Telephone Encounter (Signed)
Rx sent to pharmacy.  Daughter advised.

## 2012-08-02 ENCOUNTER — Ambulatory Visit (INDEPENDENT_AMBULATORY_CARE_PROVIDER_SITE_OTHER): Payer: Medicare Other | Admitting: *Deleted

## 2012-08-02 ENCOUNTER — Encounter: Payer: Self-pay | Admitting: Physician Assistant

## 2012-08-02 ENCOUNTER — Ambulatory Visit (INDEPENDENT_AMBULATORY_CARE_PROVIDER_SITE_OTHER): Payer: Medicare Other | Admitting: Physician Assistant

## 2012-08-02 VITALS — BP 143/57 | HR 83 | Ht 72.0 in | Wt 203.0 lb

## 2012-08-02 DIAGNOSIS — I2510 Atherosclerotic heart disease of native coronary artery without angina pectoris: Secondary | ICD-10-CM

## 2012-08-02 DIAGNOSIS — I4891 Unspecified atrial fibrillation: Secondary | ICD-10-CM

## 2012-08-02 DIAGNOSIS — I10 Essential (primary) hypertension: Secondary | ICD-10-CM

## 2012-08-02 DIAGNOSIS — R609 Edema, unspecified: Secondary | ICD-10-CM

## 2012-08-02 DIAGNOSIS — R0602 Shortness of breath: Secondary | ICD-10-CM

## 2012-08-02 DIAGNOSIS — M79609 Pain in unspecified limb: Secondary | ICD-10-CM

## 2012-08-02 DIAGNOSIS — I498 Other specified cardiac arrhythmias: Secondary | ICD-10-CM

## 2012-08-02 DIAGNOSIS — N189 Chronic kidney disease, unspecified: Secondary | ICD-10-CM

## 2012-08-02 DIAGNOSIS — I739 Peripheral vascular disease, unspecified: Secondary | ICD-10-CM | POA: Insufficient documentation

## 2012-08-02 LAB — POCT INR: INR: 2.2

## 2012-08-02 MED ORDER — FUROSEMIDE 20 MG PO TABS: ORAL_TABLET | ORAL | Status: DC

## 2012-08-02 NOTE — Patient Instructions (Addendum)
Your physician has requested that you have a lower extremity arterial duplex THIS IS TO BE DONE WITH ABI'S; Dx B/L LEG PAIN, PAD TRY TO SCHEDULE THIS TO BE DONE IN ABOUT 1 WEEK PER SCOTT WEAVER, PAC. This test is an ultrasound of the arteries in the legs or arms. It looks at arterial blood flow in the legs and arms. Allow one hour for Lower and Upper Arterial scans. There are no restrictions or special instructions  Your physician has requested that you have an echocardiogram DX SOB, SWELLING. Echocardiography is a painless test that uses sound waves to create images of your heart. It provides your doctor with information about the size and shape of your heart and how well your heart's chambers and valves are working. This procedure takes approximately one hour. There are no restrictions for this procedure.  Your physician recommends that you schedule a follow-up appointment in: 2 WEEKS WITH SCOTT WEAVER, PAC SAME DAY DR. Clifton James IS IN THE OFFICE  ON 10/30 AND 08/04/12 INCREASE LASIX TO 40 MG TWICE DAILY THE BACK ON Friday 08/05/12 START LASIX 60 MG DAILY  INCREASE DIETARY POTASSIUM FOR THE 2 DAYS OF THE INCREASED LASIX DOSE  Your physician recommends that you return for lab work in: TODAY BMET, CBC W/DIFF, BNP  REPEAT BMET 08/10/12

## 2012-08-02 NOTE — Progress Notes (Signed)
[address], [address] [place], [place]  [address] Phone: [phone], Fax:  [phone]  Date:  08/02/2012   Name:  Justin Lane   DOB:  11-Feb-1930   MRN:  [id number]  PCP:  Crawford Givens, MD  Primary Cardiologist:  Dr. Verne Carrow  Primary Electrophysiologist:  None    History of Present Illness: Justin Lane is a 78 y.o. male who returns for evaluation of edema.  He has a hx of CAD, s/p inf MI tx POBA in 1997 and subsequent BMS to the RCA in 1998, NSTEMI 9/04 tx with Taxus DES to the mRCA for 80% ISR, Parox AFib, PAD, s/p bilat Iliac artery stenting (Dr. [name]), HTN, HL, DM2, Parkinson's Dz, CKD.  Follows with our coumadin clinic for his coumadin and sees Dr. [name] for CKD.  Last seen by Dr. Verne Carrow 8/13.    Over the last month or so, he has noted increased bilateral LE edema.  Saw his PCP.  Lasix was added with some improvement.  He has baseline DOE.  No change.  Probably describes Class IIb-III symptoms.  No orthopnea, PND.  No chest pain.  No syncope.  No cough.  No other medication changes.  No increased salt.  He notes bilateral leg pain with walking, especially in the left hip.  Also notes pain at rest.    Labs (12/11):   Hgb 13.4, Labs (6/12):     K 4.1, creatinine 1.4, ALT 21, LDL 60 Labs (9/13):     TSH 5.75 Labs (10/13):   K 4, creatinine 1.4  Lab Results  Component Value Date   INR 2.2 08/02/2012   INR 3.2 07/18/2012   INR 3.2 07/05/2012         Wt Readings from Last 3 Encounters:  08/02/12 203 lb (92.08 kg)  07/18/12 212 lb (96.163 kg)  06/24/12 213 lb (96.616 kg)     Past Medical History  Diagnosis Date  . GERD (gastroesophageal reflux disease)   . Hypothyroidism   . Anemia     Mild  . Hypertension   . Diabetes mellitus     Goal A1c ~8 due to concern for hypoglycemia  . Hyperlipidemia   . PVD (peripheral vascular disease)     Status post bilateral renal stenting, LE's  . Paroxysmal atrial fibrillation     On  Coumadin and rate control meds  . CAD (coronary artery disease)     a. Inf MI 1997 tx with POBA;  b. s/p BMS to RCA for restonosis;  b. NSTEMI 9/04 => LHC 06/22/03:  LM 30%, LAD and CFX irregs, mRCA 80% ISR, EF 60% => PCI: Taxus DES to mRCA  . Hemorrhoids   . Gastritis 06/24/03 & 12/08/06    EGD Christella Hartigan)  . Hiatal hernia   . Gout   . Vitamin B 12 deficiency   . BPH (benign prostatic hyperplasia)   . Diverticular disease     via ACBE  . Herniated disc     L4, L5  . Near syncope 10/07/09  . Parkinson's disease     Per Dr. Terrace Arabia  . Chronic kidney disease     Chronic, with a creatinine in the range of 1.2-1.6;  hx of ARF 11/2009  . Renal calculus     Recurrent lithotripsy  . Hx of echocardiogram     a. Echo 10/2009:  mild LVH, EF 55-60%,     Current Outpatient Prescriptions  Medication Sig Dispense Refill  . acetaminophen (  TYLENOL) 500 MG tablet Take 500 mg by mouth. As needed.      Marland Kitchen amLODipine (NORVASC) 5 MG tablet TAKE 1 TABLET BY MOUTH ONCE DAILY  30 tablet  9  . aspirin 81 MG tablet Take 81 mg by mouth daily.       Marland Kitchen atorvastatin (LIPITOR) 80 MG tablet take 1 tablet by mouth every evening  30 tablet  12  . AVODART 0.5 MG capsule TAKE 1 CAPSULE BY MOUTH ONCE DAILY  30 capsule  12  . B-D INS SYRINGE 0.5CC/31GX5/16 31G X 5/16" 0.5 ML MISC use as directed twice a day  100 each  11  . carbidopa-levodopa (SINEMET CR) 50-200 MG per tablet Take 1 tablet by mouth at bedtime.      . carvedilol (COREG) 25 MG tablet Take 25 mg by mouth 2 (two) times daily with meals.        . Cholecalciferol (VITAMIN D3) 1000 UNITS capsule Take 1,000 Units by mouth daily.        . cilostazol (PLETAL) 100 MG tablet take 1 tablet by mouth twice a day  60 tablet  6  . cyanocobalamin 500 MCG tablet Take 1,000 mcg by mouth daily.       . digoxin (LANOXIN) 0.125 MG tablet Take 1 tablet (125 mcg total) by mouth daily.  30 tablet  11  . donepezil (ARICEPT) 10 MG tablet Take 10 mg by mouth at bedtime.        . Ferrous  Sulfate (RA IRON) 27 MG TABS Take 1 tablet by mouth daily.        . furosemide (LASIX) 20 MG tablet Take 2 tablets by mouth daily.  60 tablet  2  . insulin detemir (LEVEMIR) 100 UNIT/ML injection Inject 28 units subcutaneously once daily  10 mL  12  . levothyroxine (SYNTHROID, LEVOTHROID) 25 MCG tablet Take 1 tablet (25 mcg total) by mouth daily.  30 tablet  12  . nitroGLYCERIN (NITROSTAT) 0.3 MG SL tablet Place 1 tablet (0.3 mg total) under the tongue every 5 (five) minutes as needed.  25 tablet  6  . NOVOLOG 100 UNIT/ML injection 35 UNITS EVERY MORNING AND 35 UNITS EVERY PM  30 mL  11  . warfarin (COUMADIN) 5 MG tablet Use as directed by Anticoagulation Clinic  45 tablet  3  . DISCONTD: insulin detemir (LEVEMIR) 100 UNIT/ML injection Inject 28 Units into the skin daily.        Allergies: Allergies  Allergen Reactions  . Codeine Sulfate     REACTION: nausea and vomiting    Social History:  The patient  reports that he quit smoking about 25 years ago. He does not have any smokeless tobacco history on file. He reports that he does not drink alcohol.   ROS:  Please see the history of present illness.   No fevers, cough, melena, hematochezia, vomiting or diarrhea.   All other systems reviewed and negative.   PHYSICAL EXAM: VS:  BP 143/57  Pulse 83  Ht 6' (1.829 m)  Wt 203 lb (92.08 kg)  BMI 27.53 kg/m2 Well nourished, well developed, in no acute distress HEENT: normal Neck: no JVD Cardiac:  normal S1, S2; RRR; no murmur; no S3 Lungs:  clear to auscultation bilaterally, no wheezing, rhonchi or rales Abd: soft, nontender, no hepatomegaly Ext: 1-2+ bilateral LE/ankle edema Skin: warm and dry Neuro:  CNs 2-12 intact, no focal abnormalities noted  EKG:  NSR, HR 83, normal axis, poor R wave progression,  nonspecific ST-T wave changes, no change since prior tracing      ASSESSMENT AND PLAN:  1. Edema:   This is likely multifactorial.  He has CKD.  He has evidence of venous  insufficiency.  He also takes Amlodipine.  Recent INRs therapeutic.  So, doubt DVT.  Prior EF was normal.  Will repeat Echo to reassess LV systolic and diastolic function.  Repeat BMET today.  Also, check CBC and BNP.  Increase Lasix to 40 mg bid x 2 days.  Then continue Lasix at 60 mg QD.  Check BMET in one week.  Follow up with me in 2 weeks.  2. Peripheral Arterial Disease:   Do not think edema related to PAD.  But, he is having LE pain.  Will plan repeat ABIs.  3. Coronary Artery Disease:   No angina.  Continue ASA, statin.  Check Echo as noted.  4. Chronic Kidney Disease:   Check BMET and repeat in one week.  5. Hypertension:   Elevated BP.  Adjust Lasix as noted.  6. Atrial Fibrillation:   Maintaining NSR.  Coumadin managed by our coumadin clinic.  7. Hyperlipidemia:   Continue current Rx.  Luna Glasgow, PA-C  3:48 PM 08/02/2012

## 2012-08-03 ENCOUNTER — Telehealth: Payer: Self-pay | Admitting: *Deleted

## 2012-08-03 DIAGNOSIS — I10 Essential (primary) hypertension: Secondary | ICD-10-CM

## 2012-08-03 LAB — BRAIN NATRIURETIC PEPTIDE: Pro B Natriuretic peptide (BNP): 78 pg/mL (ref 0.0–100.0)

## 2012-08-03 LAB — CBC WITH DIFFERENTIAL/PLATELET
Eosinophils Relative: 1.1 % (ref 0.0–5.0)
HCT: 39.4 % (ref 39.0–52.0)
Monocytes Relative: 7.7 % (ref 3.0–12.0)
Neutrophils Relative %: 69.5 % (ref 43.0–77.0)
Platelets: 251 10*3/uL (ref 150.0–400.0)
RBC: 4.08 Mil/uL — ABNORMAL LOW (ref 4.22–5.81)
WBC: 8.7 10*3/uL (ref 4.5–10.5)

## 2012-08-03 LAB — BASIC METABOLIC PANEL
BUN: 28 mg/dL — ABNORMAL HIGH (ref 6–23)
GFR: 46.41 mL/min — ABNORMAL LOW (ref >60.00)
Potassium: 3.7 mEq/L (ref 3.5–5.1)

## 2012-08-03 MED ORDER — POTASSIUM CHLORIDE ER 10 MEQ PO TBCR: 10.0000 meq | EXTENDED_RELEASE_TABLET | Freq: Every day | ORAL | Status: DC

## 2012-08-03 NOTE — Telephone Encounter (Signed)
pt notified about lab results, will get bmet 11/5 when he comes in for other testing that day in our office, pt verbalized understanding today

## 2012-08-03 NOTE — Telephone Encounter (Signed)
Message copied by Tarri Fuller on Wed Aug 03, 2012  5:25 PM ------      Message from: [place], [place] T      Created: Wed Aug 03, 2012  1:41 PM       Add K+ 10 mEq daily.      Hgb normal.      BNP ok - no sign of CHF.      Make sure he has BMET repeated in one week as planned.      Tereso Newcomer, PA-C  1:41 PM 08/03/2012

## 2012-08-04 ENCOUNTER — Other Ambulatory Visit: Payer: Self-pay | Admitting: Cardiology

## 2012-08-09 ENCOUNTER — Other Ambulatory Visit: Payer: Medicare Other

## 2012-08-09 ENCOUNTER — Other Ambulatory Visit: Payer: Self-pay | Admitting: Cardiology

## 2012-08-09 ENCOUNTER — Encounter (INDEPENDENT_AMBULATORY_CARE_PROVIDER_SITE_OTHER): Payer: Medicare Other

## 2012-08-09 ENCOUNTER — Other Ambulatory Visit (INDEPENDENT_AMBULATORY_CARE_PROVIDER_SITE_OTHER): Payer: Medicare Other

## 2012-08-09 DIAGNOSIS — M79606 Pain in leg, unspecified: Secondary | ICD-10-CM

## 2012-08-09 DIAGNOSIS — R0602 Shortness of breath: Secondary | ICD-10-CM

## 2012-08-09 DIAGNOSIS — I70219 Atherosclerosis of native arteries of extremities with intermittent claudication, unspecified extremity: Secondary | ICD-10-CM

## 2012-08-09 DIAGNOSIS — I2510 Atherosclerotic heart disease of native coronary artery without angina pectoris: Secondary | ICD-10-CM

## 2012-08-09 DIAGNOSIS — M79604 Pain in right leg: Secondary | ICD-10-CM

## 2012-08-09 DIAGNOSIS — I739 Peripheral vascular disease, unspecified: Secondary | ICD-10-CM

## 2012-08-09 DIAGNOSIS — I10 Essential (primary) hypertension: Secondary | ICD-10-CM

## 2012-08-09 DIAGNOSIS — R0989 Other specified symptoms and signs involving the circulatory and respiratory systems: Secondary | ICD-10-CM

## 2012-08-09 DIAGNOSIS — R609 Edema, unspecified: Secondary | ICD-10-CM

## 2012-08-11 ENCOUNTER — Ambulatory Visit (HOSPITAL_COMMUNITY): Payer: Medicare Other | Attending: Internal Medicine | Admitting: Radiology

## 2012-08-11 DIAGNOSIS — I2510 Atherosclerotic heart disease of native coronary artery without angina pectoris: Secondary | ICD-10-CM | POA: Insufficient documentation

## 2012-08-11 DIAGNOSIS — I129 Hypertensive chronic kidney disease with stage 1 through stage 4 chronic kidney disease, or unspecified chronic kidney disease: Secondary | ICD-10-CM | POA: Insufficient documentation

## 2012-08-11 DIAGNOSIS — I369 Nonrheumatic tricuspid valve disorder, unspecified: Secondary | ICD-10-CM | POA: Insufficient documentation

## 2012-08-11 DIAGNOSIS — R0602 Shortness of breath: Secondary | ICD-10-CM

## 2012-08-11 DIAGNOSIS — R0989 Other specified symptoms and signs involving the circulatory and respiratory systems: Secondary | ICD-10-CM | POA: Insufficient documentation

## 2012-08-11 DIAGNOSIS — R0609 Other forms of dyspnea: Secondary | ICD-10-CM | POA: Insufficient documentation

## 2012-08-11 DIAGNOSIS — E119 Type 2 diabetes mellitus without complications: Secondary | ICD-10-CM | POA: Insufficient documentation

## 2012-08-11 DIAGNOSIS — R609 Edema, unspecified: Secondary | ICD-10-CM | POA: Insufficient documentation

## 2012-08-11 DIAGNOSIS — M79604 Pain in right leg: Secondary | ICD-10-CM

## 2012-08-11 DIAGNOSIS — N189 Chronic kidney disease, unspecified: Secondary | ICD-10-CM | POA: Insufficient documentation

## 2012-08-11 DIAGNOSIS — I739 Peripheral vascular disease, unspecified: Secondary | ICD-10-CM | POA: Insufficient documentation

## 2012-08-11 NOTE — Progress Notes (Signed)
Echocardiogram performed.  

## 2012-08-15 ENCOUNTER — Other Ambulatory Visit: Payer: Self-pay | Admitting: Family Medicine

## 2012-08-17 ENCOUNTER — Ambulatory Visit (INDEPENDENT_AMBULATORY_CARE_PROVIDER_SITE_OTHER): Payer: Medicare Other | Admitting: *Deleted

## 2012-08-17 DIAGNOSIS — I219 Acute myocardial infarction, unspecified: Secondary | ICD-10-CM

## 2012-08-17 DIAGNOSIS — E785 Hyperlipidemia, unspecified: Secondary | ICD-10-CM

## 2012-08-17 DIAGNOSIS — I10 Essential (primary) hypertension: Secondary | ICD-10-CM

## 2012-08-17 LAB — BASIC METABOLIC PANEL
CO2: 23 mEq/L (ref 19–32)
Chloride: 100 mEq/L (ref 96–112)
Potassium: 3.5 mEq/L (ref 3.5–5.1)
Sodium: 135 mEq/L (ref 135–145)

## 2012-08-18 NOTE — Progress Notes (Signed)
Lab was not done this day

## 2012-08-19 ENCOUNTER — Telehealth: Payer: Self-pay | Admitting: *Deleted

## 2012-08-19 NOTE — Telephone Encounter (Signed)
pt notified about lab and abnormal ABI results and that he will now see Dr. Clifton James on 11/20 @ 11:45 for the abnormal ABI instead of seeing Bing Neighbors. PAC that day; pt verbal understanding today

## 2012-08-19 NOTE — Telephone Encounter (Signed)
Message copied by Tarri Fuller on Fri Aug 19, 2012  9:47 AM ------      Message from: [place], [place] T      Created: Thu Aug 18, 2012  9:56 AM       ABIs abnormal.      He should follow up with Dr. Verne Carrow to discuss.      He sees me on 11/20.      Is there any way to change him over to Dr. Gibson Ramp schedule?      Tereso Newcomer, PA-C  9:55 AM 08/18/2012

## 2012-08-22 ENCOUNTER — Telehealth: Payer: Self-pay | Admitting: *Deleted

## 2012-08-22 ENCOUNTER — Encounter: Payer: Self-pay | Admitting: Physician Assistant

## 2012-08-22 NOTE — Telephone Encounter (Signed)
Message copied by Tarri Fuller on Mon Aug 22, 2012 11:28 AM ------      Message from: [place], [place] T      Created: Mon Aug 22, 2012  9:50 AM       Please inform patient that:      EF is ok      His heart muscle is a little thick.      Follow up with Dr. Verne Carrow to discuss further as planned.      Tereso Newcomer, PA-C  9:50 AM 08/22/2012

## 2012-08-22 NOTE — Telephone Encounter (Signed)
pt notified about echo results today w/verbal understanding that DR. McAlhany will d/w pt in more detail about heart muscle thick

## 2012-08-24 ENCOUNTER — Ambulatory Visit: Payer: Medicare Other | Admitting: Physician Assistant

## 2012-08-24 ENCOUNTER — Ambulatory Visit (INDEPENDENT_AMBULATORY_CARE_PROVIDER_SITE_OTHER): Payer: Medicare Other | Admitting: Cardiovascular Disease

## 2012-08-24 ENCOUNTER — Encounter: Payer: Self-pay | Admitting: Cardiovascular Disease

## 2012-08-24 ENCOUNTER — Other Ambulatory Visit: Payer: Self-pay | Admitting: Cardiology

## 2012-08-24 VITALS — BP 106/50 | HR 87 | Ht 72.0 in | Wt 208.8 lb

## 2012-08-24 DIAGNOSIS — I2510 Atherosclerotic heart disease of native coronary artery without angina pectoris: Secondary | ICD-10-CM

## 2012-08-24 DIAGNOSIS — R609 Edema, unspecified: Secondary | ICD-10-CM

## 2012-08-24 DIAGNOSIS — R600 Localized edema: Secondary | ICD-10-CM

## 2012-08-24 DIAGNOSIS — I739 Peripheral vascular disease, unspecified: Secondary | ICD-10-CM

## 2012-08-24 MED ORDER — FUROSEMIDE 40 MG PO TABS: 40.0000 mg | ORAL_TABLET | Freq: Two times a day (BID) | ORAL | Status: DC

## 2012-08-24 NOTE — Patient Instructions (Addendum)
Your physician recommends that you schedule a follow-up appointment in:  3 months.   Your physician has recommended you make the following change in your medication: Stop amlodipine. Increase furosemide to 40 mg by mouth twice daily   Your physician recommends that you return for lab work in:  10 days.  BMP

## 2012-08-24 NOTE — Progress Notes (Signed)
History of Present Illness: 78 yo WM with history of CAD, PAF, PVD s/p bilateral iliac artery stenting in past by Dr. [name], HTN, Hyperlipidemia, DM, Parkinsons disease and CRI here today for cardiac follow up. He has been followed in the past by Dr. Juanda Chance. His cardiac history includes remote inferior MI and more recent stenting of the RCA in 2004. He has been stable from a CV perspective since that time. The patient has paroxysmal atrial fibrillation and is maintained on chronic anticoagulation with coumadin. He also has chronic renal insufficiency with creatinines in the range of 1.7. He has seen Dr. [name] for this. He was most recently seen several times by Tereso Newcomer, PA-C in our office for c/o lower extremity swelling. Lasix was increased for several days and the LE edema improved. His Lasix was lowered back to prior dosage of 60 mg per day. BMET stable with additional diuresis. Echo 08/11/12 with vigorous LV function, LVEF 70-75% with mid cavity obliteration. Mild MS. He also had c/o left hip pain, worsened with walking, present for months. LE arterial dopplers with falsely elevated ABI, moderate disease in both legs from common femorals to popliteals with at least biphasic waveforms.   He is here today for cardiac follow up. No chest pain or SOB. No palpitations, near syncope or syncope. He has not been aware of any irregularities of his heart rhythm. His left hip still hurts with walking. No calf pain. No ulcerations. LE edema still present but resolves at night.   Primary Care Physician: Crawford Givens  Last Lipid Profile:Lipid Panel     Component Value Date/Time   CHOL 146 03/31/2011 0841   TRIG 280.0* 03/31/2011 0841   HDL 41.70 03/31/2011 0841   CHOLHDL 4 03/31/2011 0841   VLDL 56.0* 03/31/2011 0841   LDLCALC 83 10/14/2007 0932     Past Medical History  Diagnosis Date  . GERD (gastroesophageal reflux disease)   . Hypothyroidism   . Anemia     Mild  . Hypertension   . Diabetes  mellitus     Goal A1c ~8 due to concern for hypoglycemia  . Hyperlipidemia   . PVD (peripheral vascular disease)     Status post bilateral renal stenting, LE's  . Paroxysmal atrial fibrillation     On Coumadin and rate control meds  . CAD (coronary artery disease)     a. Inf MI 1997 tx with POBA;  b. s/p BMS to RCA for restonosis;  b. NSTEMI 9/04 => LHC 06/22/03:  LM 30%, LAD and CFX irregs, mRCA 80% ISR, EF 60% => PCI: Taxus DES to mRCA  . Hemorrhoids   . Gastritis 06/24/03 & 12/08/06    EGD Christella Hartigan)  . Hiatal hernia   . Gout   . Vitamin B 12 deficiency   . BPH (benign prostatic hyperplasia)   . Diverticular disease     via ACBE  . Herniated disc     L4, L5  . Near syncope 10/07/09  . Parkinson's disease     Per Dr. Terrace Arabia  . Chronic kidney disease     Chronic, with a creatinine in the range of 1.2-1.6;  hx of ARF 11/2009  . Renal calculus     Recurrent lithotripsy  . Hx of echocardiogram     a. Echo 10/2009:  mild LVH, EF 55-60%, b.  Echo 11/13:  mild LVH, EF 70-75%, mid cavity dynamic obstruction wth Valsalva (pk velocity 307 cm/sec, pk gradient 38 mmHg), mild MS, mean gradient 7  mmHg    Past Surgical History  Procedure Date  . Angioplasty 10/97    Multiple   . Lithotripsy 04/29/00    Due to renal lithiasis   . Cardiac catheterization 06/22/03    and PICA for in-stent restenosis  . Iliac vein angioplasty / stenting 05/16/04    Bilateral  . Laminectomy 09/28/07    L4,L5  (Dr. Ophelia Charter)  . Cholecystectomy open 10/23/2009    Attempted lap, purulent GB (Dr. Dwain Sarna)  . Cataract extraction     right eye 2013    Current Outpatient Prescriptions  Medication Sig Dispense Refill  . acetaminophen (TYLENOL) 500 MG tablet Take 500 mg by mouth. As needed.      Marland Kitchen amLODipine (NORVASC) 5 MG tablet take 1 tablet by mouth once daily  30 tablet  9  . aspirin 81 MG tablet Take 81 mg by mouth daily.       Marland Kitchen atorvastatin (LIPITOR) 80 MG tablet take 1 tablet by mouth every evening  30 tablet   12  . AVODART 0.5 MG capsule TAKE 1 CAPSULE BY MOUTH ONCE DAILY  30 capsule  12  . B-D INS SYRINGE 0.5CC/31GX5/16 31G X 5/16" 0.5 ML MISC use as directed twice a day  100 each  11  . carbidopa-levodopa (SINEMET CR) 50-200 MG per tablet Take 1 tablet by mouth at bedtime.      . carvedilol (COREG) 25 MG tablet Take 25 mg by mouth 2 (two) times daily with meals.        . Cholecalciferol (VITAMIN D3) 1000 UNITS capsule Take 1,000 Units by mouth daily.        . cilostazol (PLETAL) 100 MG tablet take 1 tablet by mouth twice a day  60 tablet  6  . cyanocobalamin 500 MCG tablet Take 1,000 mcg by mouth daily.       . digoxin (LANOXIN) 0.125 MG tablet Take 1 tablet (125 mcg total) by mouth daily.  30 tablet  11  . donepezil (ARICEPT) 10 MG tablet Take 10 mg by mouth at bedtime.        . Ferrous Sulfate (RA IRON) 27 MG TABS Take 1 tablet by mouth daily.        . furosemide (LASIX) 20 MG tablet Wed 10/30 and Thurs 10/31 take 40 mg twice daily start Friday 08/05/12 60 mg daily  90 tablet  6  . insulin detemir (LEVEMIR) 100 UNIT/ML injection Inject 28 units subcutaneously once daily  10 mL  12  . levothyroxine (SYNTHROID, LEVOTHROID) 25 MCG tablet Take 1 tablet (25 mcg total) by mouth daily.  30 tablet  12  . nitroGLYCERIN (NITROSTAT) 0.3 MG SL tablet Place 1 tablet (0.3 mg total) under the tongue every 5 (five) minutes as needed.  25 tablet  6  . NOVOLOG 100 UNIT/ML injection 35 UNITS EVERY MORNING AND 35 UNITS EVERY PM  30 mL  11  . potassium chloride (K-DUR) 10 MEQ tablet Take 1 tablet (10 mEq total) by mouth daily.  30 tablet  11  . warfarin (COUMADIN) 5 MG tablet Use as directed by Anticoagulation Clinic  45 tablet  3    Allergies  Allergen Reactions  . Codeine Sulfate     REACTION: nausea and vomiting    History   Social History  . Marital Status: Married    Spouse Name: N/A    Number of Children: 3  . Years of Education: N/A   Occupational History  . Retired, [employer]  Social History Main Topics  . Smoking status: Former Smoker    Quit date: 11/11/1986  . Smokeless tobacco: Not on file     Comment: Quit after MI  . Alcohol Use: No  . Drug Use:   . Sexually Active:    Other Topics Concern  . Not on file   Social History Narrative   Lives with wife    Family History  Problem Relation Age of Onset  . Heart disease Mother   . Cancer Father     ? Skin  . Diabetes Sister   . Heart disease Brother   . Heart disease Brother     Review of Systems:  As stated in the HPI and otherwise negative.   BP 106/50  Pulse 87  Ht 6' (1.829 m)  Wt 208 lb 12.8 oz (94.711 kg)  BMI 28.32 kg/m2  SpO2 96%  Physical Examination: General: Well developed, well nourished, NAD HEENT: OP clear, mucus membranes moist SKIN: warm, dry. No rashes. Neuro: No focal deficits Musculoskeletal: Muscle strength 5/5 all ext Psychiatric: Mood and affect normal Neck: No JVD, no carotid bruits, no thyromegaly, no lymphadenopathy. Lungs:Clear bilaterally, no wheezes, rhonci, crackles Cardiovascular: Regular rate and rhythm. No murmurs, gallops or rubs. Abdomen:Soft. Bowel sounds present. Non-tender.  Extremities: No lower extremity edema. Pulses are trace to 1+ in the bilateral DP/PT.  Echo 08/11/12:  Left ventricle: Wall thickness was increased in a pattern of mild LVH. Systolic function was vigorous. The estimated ejection fraction was in the range of 70% to 75%. There was dynamic obstruction. There was dynamic obstruction during Valsalvain the mid cavity, with a peak velocity of 307cm/sec and a peak gradient of 38mm Hg. There was an increased relative contribution of atrial contraction to ventricular filling. - Mitral valve: Calcified annulus. The findings are consistent with mild stenosis. Mean gradient: 7mm Hg (D). Peak gradient: 18mm Hg (D).  Assessment and Plan:   1. CAD: Stable. No changes today  2. PAD: He has at least moderate disease in both legs but  his symptoms sound more consistent with arthritis type pain. No invasive workup unless he develops signs of ischemia including  rest pain or ulcerations.   3. Bilateral lower extremity edema: This improves at night c/w dependent edema. Will increase Lasix to 40 mg po BID. Recheck BMET in 10 days. Will d/c Norvasc as this may be contributing to his edema.   4. Paroxysmal atrial fibrillation: Appears regular today. On coumadin chronically to limit risk of CVA. Continue beta blocker

## 2012-08-30 ENCOUNTER — Ambulatory Visit (INDEPENDENT_AMBULATORY_CARE_PROVIDER_SITE_OTHER): Payer: Medicare Other | Admitting: *Deleted

## 2012-08-30 DIAGNOSIS — I498 Other specified cardiac arrhythmias: Secondary | ICD-10-CM

## 2012-09-05 ENCOUNTER — Other Ambulatory Visit (INDEPENDENT_AMBULATORY_CARE_PROVIDER_SITE_OTHER): Payer: Medicare Other

## 2012-09-05 DIAGNOSIS — R600 Localized edema: Secondary | ICD-10-CM

## 2012-09-05 DIAGNOSIS — R609 Edema, unspecified: Secondary | ICD-10-CM

## 2012-09-05 LAB — BASIC METABOLIC PANEL
BUN: 25 mg/dL — ABNORMAL HIGH (ref 6–23)
Calcium: 8.7 mg/dL (ref 8.4–10.5)
GFR: 54.01 mL/min — ABNORMAL LOW (ref >60.00)
Glucose, Bld: 128 mg/dL — ABNORMAL HIGH (ref 70–99)
Sodium: 140 mEq/L (ref 135–145)

## 2012-09-06 ENCOUNTER — Other Ambulatory Visit: Payer: Self-pay | Admitting: *Deleted

## 2012-09-06 DIAGNOSIS — I10 Essential (primary) hypertension: Secondary | ICD-10-CM

## 2012-09-06 MED ORDER — POTASSIUM CHLORIDE CRYS ER 20 MEQ PO TBCR: 20.0000 meq | EXTENDED_RELEASE_TABLET | Freq: Every day | ORAL | Status: DC

## 2012-09-26 ENCOUNTER — Ambulatory Visit (INDEPENDENT_AMBULATORY_CARE_PROVIDER_SITE_OTHER): Payer: Medicare Other | Admitting: *Deleted

## 2012-09-26 DIAGNOSIS — I498 Other specified cardiac arrhythmias: Secondary | ICD-10-CM

## 2012-09-26 LAB — POCT INR: INR: 4.1

## 2012-09-29 ENCOUNTER — Other Ambulatory Visit: Payer: Self-pay | Admitting: Family Medicine

## 2012-10-10 ENCOUNTER — Other Ambulatory Visit: Payer: Self-pay | Admitting: *Deleted

## 2012-10-10 ENCOUNTER — Ambulatory Visit (INDEPENDENT_AMBULATORY_CARE_PROVIDER_SITE_OTHER): Payer: Medicare Other | Admitting: *Deleted

## 2012-10-10 DIAGNOSIS — I498 Other specified cardiac arrhythmias: Secondary | ICD-10-CM

## 2012-10-10 MED ORDER — "INSULIN SYRINGE-NEEDLE U-100 31G X 5/16"" 0.5 ML MISC": Status: DC

## 2012-10-24 ENCOUNTER — Ambulatory Visit (INDEPENDENT_AMBULATORY_CARE_PROVIDER_SITE_OTHER): Payer: Medicare Other | Admitting: Pharmacist

## 2012-10-24 DIAGNOSIS — I498 Other specified cardiac arrhythmias: Secondary | ICD-10-CM

## 2012-10-24 LAB — POCT INR: INR: 3

## 2012-10-26 ENCOUNTER — Other Ambulatory Visit: Payer: Self-pay | Admitting: Pharmacist

## 2012-10-26 MED ORDER — WARFARIN SODIUM 5 MG PO TABS: ORAL_TABLET | ORAL | Status: DC

## 2012-11-20 ENCOUNTER — Other Ambulatory Visit: Payer: Self-pay | Admitting: Family Medicine

## 2012-11-21 ENCOUNTER — Ambulatory Visit (INDEPENDENT_AMBULATORY_CARE_PROVIDER_SITE_OTHER): Payer: Medicare Other | Admitting: *Deleted

## 2012-11-21 DIAGNOSIS — I498 Other specified cardiac arrhythmias: Secondary | ICD-10-CM

## 2012-11-30 ENCOUNTER — Ambulatory Visit (INDEPENDENT_AMBULATORY_CARE_PROVIDER_SITE_OTHER): Payer: Medicare Other | Admitting: Cardiovascular Disease

## 2012-11-30 ENCOUNTER — Encounter: Payer: Self-pay | Admitting: Cardiovascular Disease

## 2012-11-30 VITALS — BP 114/54 | HR 61 | Ht 66.0 in | Wt 205.0 lb

## 2012-11-30 DIAGNOSIS — I4891 Unspecified atrial fibrillation: Secondary | ICD-10-CM

## 2012-11-30 DIAGNOSIS — R609 Edema, unspecified: Secondary | ICD-10-CM

## 2012-11-30 DIAGNOSIS — I739 Peripheral vascular disease, unspecified: Secondary | ICD-10-CM

## 2012-11-30 DIAGNOSIS — I2510 Atherosclerotic heart disease of native coronary artery without angina pectoris: Secondary | ICD-10-CM

## 2012-11-30 DIAGNOSIS — R600 Localized edema: Secondary | ICD-10-CM

## 2012-11-30 NOTE — Progress Notes (Signed)
History of Present Illness: 79 yo WM with history of CAD, PAF, PVD s/p bilateral iliac artery stenting in past by Dr. [name], HTN, Hyperlipidemia, DM, Parkinsons disease and CRI here today for cardiac follow up. He has been followed in the past by Dr. Juanda Chance. His cardiac history includes remote inferior MI and more recent stenting of the RCA in 2004. He has been stable from a CV perspective since that time. The patient has paroxysmal atrial fibrillation and is maintained on chronic anticoagulation with coumadin. He also has chronic renal insufficiency with creatinines in the range of 1.7. He has been followed in [facility]ph[facility] by Dr. [name]. He was most recently seen several times by Tereso Newcomer, PA-C in our office for c/o lower extremity swelling which improved with Lasix. Echo 08/11/12 with vigorous LV function, LVEF 70-75% with mid cavity obliteration. Mild MS. He also had c/o left hip pain, worsened with walking, present for months. LE arterial dopplers 08/09/12 with falsely elevated ABI, moderate disease in both legs from common femorals to popliteals with at least biphasic waveforms. At the last visit in November 2013, I increased his Lasix to 40 mg po BID and stopped Norvasc as it was felt that may have been contributing to his LE swelling.   He is here today for cardiac follow up. No chest pain or SOB. No palpitations, near syncope or syncope. He has not been aware of any irregularities of his heart rhythm. His left hip still hurts with walking. No calf pain. No ulcerations. LE edema still present but resolves at night.   Primary Care Physician: Crawford Givens  Last Lipid Profile: Needs updating.    Past Medical History  Diagnosis Date  . GERD (gastroesophageal reflux disease)   . Hypothyroidism   . Anemia     Mild  . Hypertension   . Diabetes mellitus     Goal A1c ~8 due to concern for hypoglycemia  . Hyperlipidemia   . PVD (peripheral vascular disease)     Status post bilateral renal  stenting, LE's  . Paroxysmal atrial fibrillation     On Coumadin and rate control meds  . CAD (coronary artery disease)     a. Inf MI 1997 tx with POBA;  b. s/p BMS to RCA for restonosis;  b. NSTEMI 9/04 => LHC 06/22/03:  LM 30%, LAD and CFX irregs, mRCA 80% ISR, EF 60% => PCI: Taxus DES to mRCA  . Hemorrhoids   . Gastritis 06/24/03 & 12/08/06    EGD Christella Hartigan)  . Hiatal hernia   . Gout   . Vitamin B 12 deficiency   . BPH (benign prostatic hyperplasia)   . Diverticular disease     via ACBE  . Herniated disc     L4, L5  . Near syncope 10/07/09  . Parkinson's disease     Per Dr. Terrace Arabia  . Chronic kidney disease     Chronic, with a creatinine in the range of 1.2-1.6;  hx of ARF 11/2009  . Renal calculus     Recurrent lithotripsy  . Hx of echocardiogram     a. Echo 10/2009:  mild LVH, EF 55-60%, b.  Echo 11/13:  mild LVH, EF 70-75%, mid cavity dynamic obstruction wth Valsalva (pk velocity 307 cm/sec, pk gradient 38 mmHg), mild MS, mean gradient 7 mmHg    Past Surgical History  Procedure Laterality Date  . Angioplasty  10/97    Multiple   . Lithotripsy  04/29/00    Due to renal lithiasis   .  Cardiac catheterization  06/22/03    and PICA for in-stent restenosis  . Iliac vein angioplasty / stenting  05/16/04    Bilateral  . Laminectomy  09/28/07    L4,L5  (Dr. Ophelia Charter)  . Cholecystectomy open  10/23/2009    Attempted lap, purulent GB (Dr. Dwain Sarna)  . Cataract extraction      right eye 2013    Current Outpatient Prescriptions  Medication Sig Dispense Refill  . acetaminophen (TYLENOL) 500 MG tablet Take 500 mg by mouth. As needed.      Marland Kitchen aspirin 81 MG tablet Take 81 mg by mouth daily.       Marland Kitchen atorvastatin (LIPITOR) 80 MG tablet take 1 tablet by mouth every evening  30 tablet  12  . AVODART 0.5 MG capsule take 1 capsule by mouth once daily  30 capsule  12  . carbidopa-levodopa (SINEMET CR) 50-200 MG per tablet Take 1 tablet by mouth at bedtime.      . carbidopa-levodopa (SINEMET IR)  25-100 MG per tablet Take 1 tablet by mouth 3 (three) times daily.      . carvedilol (COREG) 25 MG tablet Take 25 mg by mouth 2 (two) times daily with meals.        . Cholecalciferol (VITAMIN D3) 1000 UNITS capsule Take 1,000 Units by mouth daily.        . cilostazol (PLETAL) 100 MG tablet take 1 tablet by mouth twice a day  60 tablet  6  . cyanocobalamin 500 MCG tablet Take 1,000 mcg by mouth daily.       . digoxin (LANOXIN) 0.125 MG tablet Take 1 tablet (125 mcg total) by mouth daily.  30 tablet  11  . donepezil (ARICEPT) 10 MG tablet Take 10 mg by mouth at bedtime.        . Ferrous Sulfate (RA IRON) 27 MG TABS Take 1 tablet by mouth daily.        . furosemide (LASIX) 40 MG tablet Take 1 tablet (40 mg total) by mouth 2 (two) times daily.  60 tablet  6  . insulin detemir (LEVEMIR) 100 UNIT/ML injection Inject 28 units subcutaneously once daily  10 mL  12  . Insulin Syringe-Needle U-100 (B-D INS SYRINGE 0.5CC/31GX5/16) 31G X 5/16" 0.5 ML MISC Use as directed twice a day.  100 each  11  . levothyroxine (SYNTHROID, LEVOTHROID) 25 MCG tablet Take 1 tablet (25 mcg total) by mouth daily.  30 tablet  12  . nitroGLYCERIN (NITROSTAT) 0.3 MG SL tablet Place 1 tablet (0.3 mg total) under the tongue every 5 (five) minutes as needed.  25 tablet  6  . NOVOLOG 100 UNIT/ML injection 35 UNITS EVERY MORNING AND 35 UNITS EVERY PM  30 mL  11  . potassium chloride SA (K-DUR,KLOR-CON) 20 MEQ tablet Take 1 tablet (20 mEq total) by mouth daily.  32 tablet  11  . warfarin (COUMADIN) 5 MG tablet Use as directed by Anticoagulation Clinic  45 tablet  3   No current facility-administered medications for this visit.    Allergies  Allergen Reactions  . Codeine Sulfate     REACTION: nausea and vomiting    History   Social History  . Marital Status: Married    Spouse Name: N/A    Number of Children: 3  . Years of Education: N/A   Occupational History  . Retired, [employer]    Social History  Main Topics  . Smoking status: Former Smoker  Quit date: 11/11/1986  . Smokeless tobacco: Not on file     Comment: Quit after MI  . Alcohol Use: No  . Drug Use:   . Sexually Active:    Other Topics Concern  . Not on file   Social History Narrative   Lives with wife    Family History  Problem Relation Age of Onset  . Heart disease Mother   . Cancer Father     ? Skin  . Diabetes Sister   . Heart disease Brother   . Heart disease Brother     Review of Systems:  As stated in the HPI and otherwise negative.   BP 114/54  Pulse 61  Ht 5\' 6"  (1.676 m)  Wt 205 lb (92.987 kg)  BMI 33.1 kg/m2  Physical Examination: General: Well developed, well nourished, NAD HEENT: OP clear, mucus membranes moist SKIN: warm, dry. No rashes. Neuro: No focal deficits Musculoskeletal: Muscle strength 5/5 all ext Psychiatric: Mood and affect normal Neck: No JVD, no carotid bruits, no thyromegaly, no lymphadenopathy. Lungs:Clear bilaterally, no wheezes, rhonci, crackles Cardiovascular: Regular rate and rhythm. No murmurs, gallops or rubs. Abdomen:Soft. Bowel sounds present. Non-tender.  Extremities: Trace lower extremity edema. Pulses are trace in the bilateral DP/PT.  Assessment and Plan:   1. CAD: Stable. No changes today. He will need lipids and LFTs at f/u visit.   2. PAD: He has at least moderate disease in both legs but his symptoms sound more consistent with arthritis type pain. No invasive workup unless he develops signs of ischemia including rest pain or ulcerations.   3. Bilateral lower extremity edema: This improves at night c/w dependent edema. Continue Lasix 40 mg po BID.   4. Paroxysmal atrial fibrillation: Appears regular today. On coumadin chronically to limit risk of CVA. Continue beta blocker

## 2012-11-30 NOTE — Patient Instructions (Addendum)
Your physician wants you to follow-up in:  6 months. You will receive a reminder letter in the mail two months in advance. If you don't receive a letter, please call our office to schedule the follow-up appointment.   

## 2012-12-07 ENCOUNTER — Other Ambulatory Visit: Payer: Self-pay | Admitting: Family Medicine

## 2012-12-07 DIAGNOSIS — E119 Type 2 diabetes mellitus without complications: Secondary | ICD-10-CM

## 2012-12-09 LAB — POCT INR: INR: 3.7

## 2012-12-16 ENCOUNTER — Other Ambulatory Visit: Payer: Medicare Other

## 2012-12-19 ENCOUNTER — Ambulatory Visit (INDEPENDENT_AMBULATORY_CARE_PROVIDER_SITE_OTHER): Payer: Medicare Other | Admitting: *Deleted

## 2012-12-19 DIAGNOSIS — I498 Other specified cardiac arrhythmias: Secondary | ICD-10-CM

## 2012-12-19 LAB — POCT INR: INR: 4

## 2012-12-22 ENCOUNTER — Ambulatory Visit (INDEPENDENT_AMBULATORY_CARE_PROVIDER_SITE_OTHER): Payer: Medicare Other | Admitting: Family Medicine

## 2012-12-22 ENCOUNTER — Encounter: Payer: Self-pay | Admitting: Family Medicine

## 2012-12-22 VITALS — BP 108/40 | HR 85 | Temp 98.0°F

## 2012-12-22 DIAGNOSIS — E119 Type 2 diabetes mellitus without complications: Secondary | ICD-10-CM

## 2012-12-22 DIAGNOSIS — E1149 Type 2 diabetes mellitus with other diabetic neurological complication: Secondary | ICD-10-CM

## 2012-12-22 DIAGNOSIS — E1142 Type 2 diabetes mellitus with diabetic polyneuropathy: Secondary | ICD-10-CM

## 2012-12-22 LAB — COMPREHENSIVE METABOLIC PANEL
ALT: 8 U/L (ref 0–53)
AST: 19 U/L (ref 0–37)
Alkaline Phosphatase: 64 U/L (ref 39–117)
Creatinine, Ser: 1.5 mg/dL (ref 0.4–1.5)
GFR: 48.16 mL/min — ABNORMAL LOW (ref >60.00)
Sodium: 138 mEq/L (ref 135–145)
Total Bilirubin: 0.9 mg/dL (ref 0.3–1.2)
Total Protein: 6.9 g/dL (ref 6.0–8.3)

## 2012-12-22 LAB — LIPID PANEL
HDL: 36.3 mg/dL — ABNORMAL LOW (ref >39.00)
LDL Cholesterol: 50 mg/dL (ref 0–99)
Total CHOL/HDL Ratio: 3
Triglycerides: 170 mg/dL — ABNORMAL HIGH (ref 0.0–149.0)
VLDL: 34 mg/dL (ref 0.0–40.0)

## 2012-12-22 NOTE — Assessment & Plan Note (Signed)
Would continue current meds/doses but if lows then cut back levemir.  D/w pt about this, will be appetite dependent.  He agrees.  Okay for outpatient f/u and recheck in 6 months, sooner prn.  He agrees.  >25 min spent with face to face with patient, >50% counseling and/or coordinating care

## 2012-12-22 NOTE — Patient Instructions (Addendum)
We'll contact you with your lab report.  Don't change your meds for now.  If you have any low sugars, then cut the levemir back to 25 units and notify me.  Take care.  Glad to see you.  Recheck in 6 months.  30 min visit. Labs ahead of time.

## 2012-12-22 NOTE — Progress Notes (Signed)
Parkinson's tremor continues.  Compliant with sinemet.  His appetite is decreased recently, over the last few weeks.  He's getting about 2-3 meals a day.   Diabetes:  Using medications without difficulties:yes Hypoglycemic episodes:no Hyperglycemic episodes:see below Feet problems: some numbness noted.   Due for labs.  Blood Sugars averaging: 169 this AM.  160-200 in AM  PMH and SH reviewed  Meds, vitals, and allergies reviewed.   ROS: See HPI.  Otherwise negative.    GEN: nad, alert and oriented HEENT: mucous membranes moist NECK: supple w/o LA CV: rrr. PULM: ctab, no inc wob ABD: soft, +bs EXT: trace edema SKIN: no acute rash L>R hand tremor noted  Diabetic foot exam: Normal inspection No skin breakdown No calluses  Normal DP pulses Dec sensation to light touch and monofilament Nails normal

## 2012-12-26 ENCOUNTER — Encounter: Payer: Self-pay | Admitting: *Deleted

## 2012-12-30 ENCOUNTER — Other Ambulatory Visit: Payer: Self-pay

## 2012-12-30 MED ORDER — INSULIN ASPART 100 UNIT/ML ~~LOC~~ SOLN: SUBCUTANEOUS | Status: DC

## 2012-12-30 NOTE — Telephone Encounter (Signed)
[facility] [address] request refill novolog. # 30 ml x 6. Pt is waiting. Refill done.

## 2013-01-02 ENCOUNTER — Ambulatory Visit (INDEPENDENT_AMBULATORY_CARE_PROVIDER_SITE_OTHER): Payer: Medicare Other | Admitting: Pharmacist

## 2013-01-02 DIAGNOSIS — I498 Other specified cardiac arrhythmias: Secondary | ICD-10-CM

## 2013-01-02 LAB — POCT INR: INR: 4.9

## 2013-01-10 ENCOUNTER — Other Ambulatory Visit: Payer: Self-pay | Admitting: Family Medicine

## 2013-01-16 ENCOUNTER — Ambulatory Visit (INDEPENDENT_AMBULATORY_CARE_PROVIDER_SITE_OTHER): Payer: Medicare Other | Admitting: Pharmacist

## 2013-01-16 DIAGNOSIS — I498 Other specified cardiac arrhythmias: Secondary | ICD-10-CM

## 2013-01-16 LAB — POCT INR: INR: 3.7

## 2013-01-30 ENCOUNTER — Ambulatory Visit (INDEPENDENT_AMBULATORY_CARE_PROVIDER_SITE_OTHER): Payer: Medicare Other | Admitting: Pharmacist

## 2013-01-30 DIAGNOSIS — I498 Other specified cardiac arrhythmias: Secondary | ICD-10-CM

## 2013-02-20 ENCOUNTER — Ambulatory Visit (INDEPENDENT_AMBULATORY_CARE_PROVIDER_SITE_OTHER): Payer: Medicare Other | Admitting: *Deleted

## 2013-02-20 DIAGNOSIS — I498 Other specified cardiac arrhythmias: Secondary | ICD-10-CM

## 2013-02-20 LAB — POCT INR: INR: 2.2

## 2013-03-20 ENCOUNTER — Ambulatory Visit (INDEPENDENT_AMBULATORY_CARE_PROVIDER_SITE_OTHER): Payer: Medicare Other | Admitting: *Deleted

## 2013-03-20 DIAGNOSIS — I498 Other specified cardiac arrhythmias: Secondary | ICD-10-CM

## 2013-03-20 LAB — POCT INR: INR: 2.1

## 2013-04-13 ENCOUNTER — Other Ambulatory Visit: Payer: Self-pay

## 2013-04-17 ENCOUNTER — Other Ambulatory Visit: Payer: Self-pay | Admitting: *Deleted

## 2013-04-17 DIAGNOSIS — R600 Localized edema: Secondary | ICD-10-CM

## 2013-04-17 MED ORDER — FUROSEMIDE 40 MG PO TABS: 40.0000 mg | ORAL_TABLET | Freq: Two times a day (BID) | ORAL | Status: DC

## 2013-04-18 ENCOUNTER — Encounter: Payer: Self-pay | Admitting: Nurse Practitioner

## 2013-04-18 ENCOUNTER — Ambulatory Visit (INDEPENDENT_AMBULATORY_CARE_PROVIDER_SITE_OTHER): Payer: Medicare Other | Admitting: Nurse Practitioner

## 2013-04-18 VITALS — BP 105/48 | HR 83 | Ht 69.0 in | Wt 186.0 lb

## 2013-04-18 DIAGNOSIS — G20 Parkinson's disease: Secondary | ICD-10-CM

## 2013-04-18 DIAGNOSIS — R413 Other amnesia: Secondary | ICD-10-CM

## 2013-04-18 MED ORDER — CARBIDOPA-LEVODOPA 25-100 MG PO TABS: 1.0000 | ORAL_TABLET | Freq: Three times a day (TID) | ORAL | Status: DC

## 2013-04-18 MED ORDER — CARBIDOPA-LEVODOPA ER 50-200 MG PO TBCR: 1.0000 | EXTENDED_RELEASE_TABLET | Freq: Every day | ORAL | Status: DC

## 2013-04-18 MED ORDER — DONEPEZIL HCL 10 MG PO TABS: 10.0000 mg | ORAL_TABLET | Freq: Every day | ORAL | Status: DC

## 2013-04-18 NOTE — Progress Notes (Signed)
HPI: [name], 79 year old white male returns today for followup. He is accompanied by his son at today's clinical visit. Last seen  10/24/2012   He has a history of memory loss and Parkinson's disease. He tolerates, aricept 10mg  qday and sinemet 25/100 tid , at 10 AM 2 PM and 5, but also states he is inconsistent with taking his medication in terms of time of day  and the number prescribed doses . Ambulating without assistant, he has decreased stamina, he lives with his wife.MRI brain showed  moderate diffuse atrophy. Mild chronic small vessel ischemic disease. No recent falls, using cane. Sinemet CR added at last visit helped ability to turn over in bed, decreased tremor in the mornings and with getting up several times at night to go to the bathroom. He denies increased confusion, hallucinations. Appetite fair, sleeping well. Recent doppler of lower extremities with some blockage per son, not a surgical candidate. MMSE 29/30 today. Son and patient report memory is stable .Lasix has been started for edema in lower extremities. Naps during the day.   History: He has past medical history of diabetes, hypertension,coronary arteryand disease, peripheral vascular disease, hyperlipidemia he presenting with 12 months history of short-term memory trouble, left hand tremor, mild gait changes. He graduated from [employer] school, work at [employer] for 40 years, filling orders and stacking supplies, he retired in 1992, per family members, he is always witty, until about a year ago, family noticed he become easily confused, can't find his way around, becomes very quiet, rarely talks during his dinner time anymore, over the past one year, he also noticed  left hand tremor,  he can control it, most noticable when he sitting and relaxing. He also complains of generalized weakness, tiredness across his shoulder, he has become very sedentary.He denied constipation, tends to move around during sleep, He also complains of loss of  smell, no visual hallucination, he denies bilateral lower extremity paresthesia or weakness.   ROS:  Hearing loss, trouble swallowing, shortness of breath, easy bruising, dizziness, tremor, blurred vision  Physical Exam General: well developed, well nourished, seated, in no evident distress Head: head normocephalic and atraumatic. Oropharynx benign Neck: supple with no carotid  bruits Cardiovascular: regular rate and rhythm, no murmurs Skin: Bruising and hands and forearms  Neurologic Exam Mental Status: Awake and fully alert. Oriented to place and time. MMSE 29/30 using 1 recall item . AFT 13. Follows all commands. Speech and language normal.   Cranial Nerves:  Pupils equal, briskly reactive to light. Extraocular movements full without nystagmus. Visual fields full to confrontation. Hearing intact and symmetric to finger snap. Facial sensation intact. Face, tongue, palate move normally and symmetrically. Neck flexion and extension normal. Negative Myerson sign Motor: Normal bulk and tone. Normal strength in all tested extremity muscles.No focal weakness. Mild resting tremor left hand, no outstretched tremor, mild rigidity Coordination: Rapid alternating movements normal in all extremities. Finger-to-nose performed accurately bilaterally. No dysmetria Gait and Station: Needs to push up from seated position, narrow-based and cautious gait leaning forward, moderate stride, increased left hand tremor when ambulating no decreased arm swing no difficulty with turns ambulating with a cane   Reflexes: 1+ and symmetric. Toes downgoing.     ASSESSMENT: 79 year old male with short-term memory trouble and resting tremor. 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 Continue long-acting carbidopa levodopa at  bedtime around 11 PM Continue Aricept for memory,  Mini-Mental Status exam stable Will renew medications  Followup in 6 months,   Nilda Riggs, GNP-BC APRN

## 2013-04-18 NOTE — Patient Instructions (Addendum)
Continue carbidopa levodopa short acting 3 times a day try  for 4-5 hours between doses Continue long-acting carbidopa levodopa at bedtime around 11 PM Continue Aricept for memory, Mini-Mental Status exam stable Will renew medications  Followup in 6 months,

## 2013-05-01 ENCOUNTER — Ambulatory Visit (INDEPENDENT_AMBULATORY_CARE_PROVIDER_SITE_OTHER): Payer: Medicare Other | Admitting: *Deleted

## 2013-05-01 DIAGNOSIS — I498 Other specified cardiac arrhythmias: Secondary | ICD-10-CM

## 2013-05-02 ENCOUNTER — Other Ambulatory Visit: Payer: Self-pay | Admitting: *Deleted

## 2013-05-02 MED ORDER — "INSULIN SYRINGE-NEEDLE U-100 31G X 3/8"" 1 ML MISC": Status: DC

## 2013-05-08 ENCOUNTER — Telehealth: Payer: Self-pay | Admitting: Family Medicine

## 2013-05-08 NOTE — Telephone Encounter (Signed)
Daughter advised.  Appt is already scheduled.

## 2013-05-08 NOTE — Telephone Encounter (Signed)
Call pt.  I'll see him tomorrow.  He can cut the levemir back by 4 units per day in the meantime if needed.  Thanks.

## 2013-05-08 NOTE — Telephone Encounter (Signed)
Patient Information:  Caller Name: Marchelle Folks  Phone: [phone]  Patient: Justin Lane, Justin Lane  Gender: Male  DOB: 1929-11-27  Age: 79 Years  PCP: Crawford Givens Clelia Croft) [facility])  Office Follow Up:  Does the office need to follow up with this patient?: No  Instructions For The Office: N/A  RN Note:  Takes Levimir 28 u SQ daily and sugars running lower than normal. Advised to bring BG Log to appointment on 05/09/13.  Symptoms  Reason For Call & Symptoms: Having weak spells and checked BP when it happened on 05/05/13= 98/60. Episodes 3-4 times week have been going on for past month. Appetite diminished. Drinking fluids. BG= 125- 148 when spells happen - giving OJ seems to help.  Reviewed Health History In EMR: Yes  Reviewed Medications In EMR: Yes  Reviewed Allergies In EMR: Yes  Reviewed Surgeries / Procedures: Yes  Date of Onset of Symptoms: 04/07/2013  Treatments Tried: checking BG and given jiuce and resting  Treatments Tried Worked: No  Guideline(s) Used:  Weakness (Generalized) and Fatigue  Disposition Per Guideline:   See Today in Office  Reason For Disposition Reached:   Moderate weakness (i.e., interferes with work, school, normal activities) and persists > 3 days  Advice Given:  Reassurance  Not drinking enough fluids and being a little dehydrated is a common cause of mild weakness.  During hot weather you sweat more and can become dehydrated more easily.  Fluids  : Drink several glasses of fruit juice, other clear fluids, or water. This will improve hydration and blood glucose.  Rest  : Lie down with feet elevated for 1 hour. This will improve blood flow and increase blood flow to the brain.  Cool Off  : If the weather is hot, apply a cold compress to the forehead or take a cool shower or bath.  Call Back If:   Still feeling weak after 2 hours of rest and fluids  Passes out (faints)  You become worse.  Patient Will Follow Care Advice:Refused to be seen at [facility] location today. Requested appnt on 05/09/13   Appointment Scheduled:  05/09/2013 11:45:00 Appointment Scheduled Provider:  Crawford Givens Clelia Croft) [facility])

## 2013-05-09 ENCOUNTER — Encounter: Payer: Self-pay | Admitting: Family Medicine

## 2013-05-09 ENCOUNTER — Ambulatory Visit (INDEPENDENT_AMBULATORY_CARE_PROVIDER_SITE_OTHER): Payer: Medicare Other | Admitting: Family Medicine

## 2013-05-09 VITALS — BP 116/42 | HR 82 | Temp 97.2°F | Wt 185.0 lb

## 2013-05-09 DIAGNOSIS — R609 Edema, unspecified: Secondary | ICD-10-CM

## 2013-05-09 DIAGNOSIS — E119 Type 2 diabetes mellitus without complications: Secondary | ICD-10-CM

## 2013-05-09 DIAGNOSIS — R600 Localized edema: Secondary | ICD-10-CM

## 2013-05-09 DIAGNOSIS — E1142 Type 2 diabetes mellitus with diabetic polyneuropathy: Secondary | ICD-10-CM

## 2013-05-09 DIAGNOSIS — I10 Essential (primary) hypertension: Secondary | ICD-10-CM

## 2013-05-09 DIAGNOSIS — E1149 Type 2 diabetes mellitus with other diabetic neurological complication: Secondary | ICD-10-CM

## 2013-05-09 LAB — HEMOGLOBIN A1C: Hgb A1c MFr Bld: 6.4 % (ref 4.6–6.5)

## 2013-05-09 MED ORDER — FUROSEMIDE 40 MG PO TABS: ORAL_TABLET | ORAL | Status: DC

## 2013-05-09 MED ORDER — CARVEDILOL 25 MG PO TABS: 12.5000 mg | ORAL_TABLET | Freq: Two times a day (BID) | ORAL | Status: DC

## 2013-05-09 NOTE — Patient Instructions (Addendum)
We'll contact you with your lab report. Try taking 1 lasix a day; take the second dose only if needed.  Cut the coreg in half.  I would cancel the September appointment here.  Recheck in about 6 months.  Take care.

## 2013-05-10 NOTE — Assessment & Plan Note (Signed)
With episodic hypotension, orthostasis.  Complicate by PD.  Would cut lasix to 1 a day, use 2nd dose only prn inc in edema.  Cut cored to 12.5 mg BID with caution for tachycardia. Call back prn.  He/family agree.

## 2013-05-10 NOTE — Progress Notes (Signed)
Recently with lower BP, mult episodes. Sugars hasn't been low at the times he has felt lightheaded.  Has to get up slowly due to lightheadedness.  Dec in edema with current lasix dose, 2 tabs a day.  SOB at baseline, not worse. No CP, no heart racing.   Meds, vitals, and allergies reviewed.   ROS: See HPI.  Otherwise, noncontributory.  nad Flat facial features as expected Sounds to be in AF but not tachy, IRR ctab abd soft Ext with trace edema Resting tremor L > R hand

## 2013-05-10 NOTE — Assessment & Plan Note (Signed)
Would not add ACE given the h/o orthostasis, therefore would not check MALB.  See notes on A1c.  Would continue as is and recheck in about 6 months.  He agrees.  D/w pt. sugar has generally been controlled with very rare lows.

## 2013-05-11 ENCOUNTER — Encounter: Payer: Self-pay | Admitting: *Deleted

## 2013-05-14 ENCOUNTER — Ambulatory Visit (INDEPENDENT_AMBULATORY_CARE_PROVIDER_SITE_OTHER): Payer: Medicare Other | Admitting: Family Medicine

## 2013-05-14 ENCOUNTER — Ambulatory Visit: Payer: Medicare Other

## 2013-05-14 VITALS — BP 120/48 | HR 74 | Temp 98.0°F | Resp 16 | Wt 185.0 lb

## 2013-05-14 DIAGNOSIS — G8929 Other chronic pain: Secondary | ICD-10-CM

## 2013-05-14 DIAGNOSIS — M25551 Pain in right hip: Secondary | ICD-10-CM

## 2013-05-14 DIAGNOSIS — M25559 Pain in unspecified hip: Secondary | ICD-10-CM

## 2013-05-14 MED ORDER — PREDNISONE 20 MG PO TABS: ORAL_TABLET | ORAL | Status: DC

## 2013-05-14 NOTE — Patient Instructions (Signed)
Results for orders placed in visit on 05/09/13  HEMOGLOBIN A1C      Result Value Range   Hemoglobin A1C 6.4  4.6 - 6.5 %

## 2013-05-14 NOTE — Progress Notes (Signed)
x this 79 year old gentleman has had over 6 months of right hip pain. It's been significantly getting worse over the last 2 weeks. He's tried taking Tylenol which has not really afforded much relief. He says the pain gets intense if he stays in any one position for very long or if he bears weight. He can only walk short distances before the pain starts getting unbearable. He also cannot lie flat without significant pain.  Patient has a history of ASCVD with stents in his peripheral arteries. He maintains pedal edema chronically. He says that pushing around his hip causes some pain but not as bad as standing and trying to walk any distance.  Objective: No acute distress, frail elderly man with Parkinsonian tremor Range of motion of the hip on the right reveals decreased internal/external rotation with only mild discomfort. Straight leg raising on the right reveals pain in the thigh and popliteal area which is reminiscent of the pain is complaining about. He has a shuffling unstable gait.  Palpation of the hip region reveals no clear or localized pain.  [facility] reading (PRIMARY) by  Dr. Milus Glazier:  Right hip shows arthritic changes.  No fracture seen  Assessment: Patient has features of both arthritis and sciatica. In addition he has peripheral vascular disease complicating the whole problem of diagnosis. At this point I think it is more likely he has a sciatic condition but he will need close followup and possibly further imaging.  Plan: Prednisone 40 mg daily x 5 with food in the morning.  Followup with Dr. Para March  Hip pain, chronic, right - Plan: DG Hip Complete Right, predniSONE (DELTASONE) 20 MG tablet  Signed, Elvina Sidle, MD

## 2013-05-15 ENCOUNTER — Other Ambulatory Visit: Payer: Self-pay

## 2013-05-15 DIAGNOSIS — R600 Localized edema: Secondary | ICD-10-CM

## 2013-05-15 MED ORDER — FUROSEMIDE 40 MG PO TABS: ORAL_TABLET | ORAL | Status: DC

## 2013-05-17 ENCOUNTER — Encounter: Payer: Self-pay | Admitting: Family Medicine

## 2013-05-17 ENCOUNTER — Other Ambulatory Visit: Payer: Self-pay | Admitting: *Deleted

## 2013-05-17 ENCOUNTER — Ambulatory Visit (INDEPENDENT_AMBULATORY_CARE_PROVIDER_SITE_OTHER): Payer: Medicare Other | Admitting: Family Medicine

## 2013-05-17 VITALS — BP 138/70 | HR 68 | Temp 97.5°F | Wt 184.2 lb

## 2013-05-17 DIAGNOSIS — M25551 Pain in right hip: Secondary | ICD-10-CM

## 2013-05-17 DIAGNOSIS — R600 Localized edema: Secondary | ICD-10-CM

## 2013-05-17 DIAGNOSIS — M25559 Pain in unspecified hip: Secondary | ICD-10-CM

## 2013-05-17 MED ORDER — FUROSEMIDE 40 MG PO TABS: ORAL_TABLET | ORAL | Status: DC

## 2013-05-17 MED ORDER — TRAMADOL HCL 50 MG PO TABS: 50.0000 mg | ORAL_TABLET | Freq: Two times a day (BID) | ORAL | Status: DC | PRN

## 2013-05-17 NOTE — Telephone Encounter (Signed)
Pharmacy called wanting refills Fax Received. Refill Completed. Carmita Boom Chowoe (R.M.A)

## 2013-05-17 NOTE — Progress Notes (Signed)
Rx called to [facility] as instructed.  Seen at [facility] for R hip and sciatica pain.  Improved on pred taper already, much less pain and better ROM.  BP is improved, less lightheadedness but needed 2 doses of lasix on most days.  Still on lower dose of BB and tolerating this well.   xrays reviewed, d/w pt.  OA noted.   Meds, vitals, and allergies reviewed.   ROS: See HPI.  Otherwise, noncontributory.  nad Tremor noted in hands Speech wnl R hip with minimal pain on int/ext rotation and able to flex the hip w/o pain.  Gait with short steps but not limping.

## 2013-05-17 NOTE — Patient Instructions (Addendum)
Try the tramadol if needed for the pain.  It can make you drowsy.  Take care.

## 2013-05-18 DIAGNOSIS — M25559 Pain in unspecified hip: Secondary | ICD-10-CM | POA: Insufficient documentation

## 2013-05-18 NOTE — Assessment & Plan Note (Signed)
Improved, finish the pred taper and then will try tramadol prn as I would like to avoid nsaids and opiates.  He agrees.  Rationale d/w pt. Call back as needed.  Sedation caution given on med.

## 2013-05-22 ENCOUNTER — Telehealth: Payer: Self-pay | Admitting: Family Medicine

## 2013-05-22 ENCOUNTER — Ambulatory Visit (INDEPENDENT_AMBULATORY_CARE_PROVIDER_SITE_OTHER): Payer: Medicare Other | Admitting: Family Medicine

## 2013-05-22 DIAGNOSIS — I498 Other specified cardiac arrhythmias: Secondary | ICD-10-CM

## 2013-05-22 NOTE — Progress Notes (Signed)
Pt ambulating in hallway with cane when he abruptly stopped, his face turned pale, and he became unsteady on his feet.  RN pulled chair around and assisted pt to sit down.  Vitals obtained.  BP 120/84, pulse 80.  Pt stated he felt "weak and shaky" and that his wife had to take over driving on their way to the appt.  Pt had not eaten since around 11:00. BG 77.  Dr. Para March notified and came into hallway to assess pt.  Pt given apple juice.  After sitting for a few minutes, he stated that he felt better.  Dr. Para March encouraged him to get something to eat, call us if problem persists.  Pt assisted to wheelchair and taken to car.

## 2013-05-22 NOTE — Progress Notes (Signed)
Pt with clear lungs and not tachy on exam.  Not edematous on exam.  He felt better with apple juice.  Will have patient monitor sugar and notify if he has other lows so we can address his meds.  Encouraged him to keep meal schedule.  He agrees.  Okay for outpatient f/u.

## 2013-05-24 NOTE — Telephone Encounter (Signed)
Error

## 2013-05-29 ENCOUNTER — Other Ambulatory Visit: Payer: Self-pay | Admitting: Family Medicine

## 2013-05-31 ENCOUNTER — Encounter: Payer: Self-pay | Admitting: Cardiovascular Disease

## 2013-05-31 ENCOUNTER — Ambulatory Visit (INDEPENDENT_AMBULATORY_CARE_PROVIDER_SITE_OTHER): Payer: Medicare Other | Admitting: Cardiovascular Disease

## 2013-05-31 VITALS — BP 116/55 | HR 82 | Ht 72.0 in | Wt 178.0 lb

## 2013-05-31 DIAGNOSIS — I2510 Atherosclerotic heart disease of native coronary artery without angina pectoris: Secondary | ICD-10-CM

## 2013-05-31 DIAGNOSIS — I739 Peripheral vascular disease, unspecified: Secondary | ICD-10-CM

## 2013-05-31 DIAGNOSIS — I4891 Unspecified atrial fibrillation: Secondary | ICD-10-CM

## 2013-05-31 NOTE — Patient Instructions (Addendum)
Your physician wants you to follow-up in:  6 months. You will receive a reminder letter in the mail two months in advance. If you don't receive a letter, please call our office to schedule the follow-up appointment.   

## 2013-05-31 NOTE — Progress Notes (Signed)
History of Present Illness: 79 yo WM with history of CAD, PAF, PVD s/p bilateral iliac artery stenting in past by Dr. [name], HTN, Hyperlipidemia, DM, Parkinsons disease and CRI here today for cardiac follow up. He has been followed in the past by Dr. Juanda Chance. His cardiac history includes remote inferior MI and more recent stenting of the RCA in 2004. He has been stable from a CV perspective since that time. The patient has paroxysmal atrial fibrillation and is maintained on chronic anticoagulation with coumadin. He also has chronic renal insufficiency with creatinines in the range of 1.7. He has been followed in Nephroogy by Dr. [name]. He has had issues with LE swelling that has been responsive to Lasix.  Echo 08/11/12 with vigorous LV function, LVEF 70-75% with mid cavity obliteration. Mild MS. He also had c/o left hip pain, worsened with walking, present for months. LE arterial dopplers 08/09/12 with falsely elevated ABI, moderate disease in both legs from common femorals to popliteals with at least biphasic waveforms. At his visit in November 2013, I increased his Lasix to 40 mg po BID and stopped Norvasc as it was felt that may have been contributing to his LE swelling.   He is here today for cardiac follow up. No chest pain or SOB. No palpitations, near syncope or syncope. He has not been aware of any irregularities of his heart rhythm. His left hip still hurts with walking. No calf pain. No ulcerations. LE edema still present but resolves at night.   Primary Care Physician: Crawford Givens   Last Lipid Profile:Lipid Panel     Component Value Date/Time   CHOL 120 12/22/2012 1153   TRIG 170.0* 12/22/2012 1153   HDL 36.30* 12/22/2012 1153   CHOLHDL 3 12/22/2012 1153   VLDL 34.0 12/22/2012 1153   LDLCALC 50 12/22/2012 1153     Past Medical History  Diagnosis Date  . GERD (gastroesophageal reflux disease)   . Hypothyroidism   . Anemia     Mild  . Hypertension   . Diabetes mellitus     Goal A1c  ~8 due to concern for hypoglycemia  . Hyperlipidemia   . PVD (peripheral vascular disease)     Status post bilateral renal stenting, LE's  . Paroxysmal atrial fibrillation     On Coumadin and rate control meds  . CAD (coronary artery disease)     a. Inf MI 1997 tx with POBA;  b. s/p BMS to RCA for restonosis;  b. NSTEMI 9/04 => LHC 06/22/03:  LM 30%, LAD and CFX irregs, mRCA 80% ISR, EF 60% => PCI: Taxus DES to mRCA  . Hemorrhoids   . Gastritis 06/24/03 & 12/08/06    EGD Christella Hartigan)  . Hiatal hernia   . Gout   . Vitamin B 12 deficiency   . BPH (benign prostatic hyperplasia)   . Diverticular disease     via ACBE  . Herniated disc     L4, L5  . Near syncope 10/07/09  . Parkinson's disease     Per Dr. Terrace Arabia  . Chronic kidney disease     Chronic, with a creatinine in the range of 1.2-1.6;  hx of ARF 11/2009  . Renal calculus     Recurrent lithotripsy  . Hx of echocardiogram     a. Echo 10/2009:  mild LVH, EF 55-60%, b.  Echo 11/13:  mild LVH, EF 70-75%, mid cavity dynamic obstruction wth Valsalva (pk velocity 307 cm/sec, pk gradient 38 mmHg), mild MS, mean gradient  7 mmHg    Past Surgical History  Procedure Laterality Date  . Angioplasty  10/97    Multiple   . Lithotripsy  04/29/00    Due to renal lithiasis   . Cardiac catheterization  06/22/03    and PICA for in-stent restenosis  . Iliac vein angioplasty / stenting  05/16/04    Bilateral  . Laminectomy  09/28/07    L4,L5  (Dr. Ophelia Charter)  . Cholecystectomy open  10/23/2009    Attempted lap, purulent GB (Dr. Dwain Sarna)  . Cataract extraction      right eye 2013    Current Outpatient Prescriptions  Medication Sig Dispense Refill  . acetaminophen (TYLENOL) 500 MG tablet Take 500 mg by mouth. As needed.      Marland Kitchen aspirin 81 MG tablet Take 81 mg by mouth daily.       Marland Kitchen atorvastatin (LIPITOR) 80 MG tablet take 1 tablet by mouth every evening  30 tablet  12  . AVODART 0.5 MG capsule take 1 capsule by mouth once daily  30 capsule  12  .  carbidopa-levodopa (SINEMET CR) 50-200 MG per tablet Take 1 tablet by mouth at bedtime.  30 tablet  6  . carbidopa-levodopa (SINEMET IR) 25-100 MG per tablet Take 1 tablet by mouth 3 (three) times daily. 1 tab at 8am, 1 pm, 6pm  90 tablet  6  . carvedilol (COREG) 25 MG tablet Take 0.5 tablets (12.5 mg total) by mouth 2 (two) times daily with a meal.      . Cholecalciferol (VITAMIN D3) 1000 UNITS capsule Take 1,000 Units by mouth daily.        . cilostazol (PLETAL) 100 MG tablet take 1 tablet by mouth twice a day  60 tablet  5  . cyanocobalamin 500 MCG tablet Take 1,000 mcg by mouth daily.       . digoxin (LANOXIN) 0.125 MG tablet Take 1 tablet (125 mcg total) by mouth daily.  30 tablet  11  . donepezil (ARICEPT) 10 MG tablet Take 1 tablet (10 mg total) by mouth daily.  30 tablet  6  . Ferrous Sulfate (RA IRON) 27 MG TABS Take 1 tablet by mouth daily.        . furosemide (LASIX) 40 MG tablet Take 1 a day.  If you have more swelling, you can take a second dose if needed.  30 tablet  3  . insulin aspart (NOVOLOG) 100 UNIT/ML injection 35 UNITS EVERY MORNING AND 35 UNITS EVERY PM  30 mL  6  . insulin detemir (LEVEMIR) 100 UNIT/ML injection Inject 5 units subcutaneously once daily      . Insulin Syringe-Needle U-100 31G X 3/8" 1 ML MISC Use as directed twice daily for insulin injection.  Diagnosis:  250.00 Insulin- Dependent.  100 each  6  . levothyroxine (SYNTHROID, LEVOTHROID) 25 MCG tablet TAKE 1 TABLET BY MOUTH DAILY  30 tablet  6  . nitroGLYCERIN (NITROSTAT) 0.3 MG SL tablet Place 1 tablet (0.3 mg total) under the tongue every 5 (five) minutes as needed.  25 tablet  6  . potassium chloride SA (K-DUR,KLOR-CON) 20 MEQ tablet Take 1 tablet (20 mEq total) by mouth daily.  32 tablet  11  . traMADol (ULTRAM) 50 MG tablet Take 1 tablet (50 mg total) by mouth 2 (two) times daily as needed for pain.  60 tablet  2  . warfarin (COUMADIN) 5 MG tablet Use as directed by Anticoagulation Clinic  45 tablet  3  No current facility-administered medications for this visit.    Allergies  Allergen Reactions  . Codeine Sulfate     REACTION: nausea and vomiting    History   Social History  . Marital Status: Married    Spouse Name: N/A    Number of Children: 3  . Years of Education: N/A   Occupational History  . Retired, [employer]    Social History Main Topics  . Smoking status: Former Smoker    Quit date: 11/11/1986  . Smokeless tobacco: Never Used     Comment: Quit after MI  . Alcohol Use: No  . Drug Use: No  . Sexual Activity: Not on file   Other Topics Concern  . Not on file   Social History Narrative   Lives with wife    Family History  Problem Relation Age of Onset  . Heart disease Mother   . Cancer Father     ? Skin  . Diabetes Sister   . Heart disease Brother   . Heart disease Brother     Review of Systems:  As stated in the HPI and otherwise negative.   BP 116/55  Pulse 82  Ht 6' (1.829 m)  Wt 178 lb (80.74 kg)  BMI 24.14 kg/m2  Physical Examination: General: Well developed, well nourished, NAD HEENT: OP clear, mucus membranes moist SKIN: warm, dry. No rashes. Neuro: No focal deficits Musculoskeletal: Muscle strength 5/5 all ext Psychiatric: Mood and affect normal Neck: No JVD, no carotid bruits, no thyromegaly, no lymphadenopathy. Lungs:Clear bilaterally, no wheezes, rhonci, crackles Cardiovascular: Regular rate and rhythm. No murmurs, gallops or rubs. Abdomen:Soft. Bowel sounds present. Non-tender.  Extremities: 1+ bilateral lower extremity edema. Pulses are 2 + in the bilateral DP/PT.  Assessment and Plan:   1. CAD: Stable. No changes today.   2. PAD: He has at least moderate disease in both legs but his symptoms sound more consistent with arthritis type pain. No invasive workup unless he develops signs of ischemia including rest pain or ulcerations.   3. Bilateral lower extremity edema: This improves at night c/w  dependent edema. Continue Lasix 40 mg po BID.   4. Paroxysmal atrial fibrillation: Appears regular today. On coumadin chronically to limit risk of CVA. Continue beta blocker

## 2013-06-02 ENCOUNTER — Other Ambulatory Visit: Payer: Self-pay | Admitting: Pharmacist

## 2013-06-02 ENCOUNTER — Other Ambulatory Visit: Payer: Self-pay | Admitting: *Deleted

## 2013-06-02 MED ORDER — DIGOXIN 125 MCG PO TABS: 125.0000 ug | ORAL_TABLET | Freq: Every day | ORAL | Status: DC

## 2013-06-02 MED ORDER — WARFARIN SODIUM 5 MG PO TABS: ORAL_TABLET | ORAL | Status: DC

## 2013-06-09 ENCOUNTER — Telehealth: Payer: Self-pay

## 2013-06-09 ENCOUNTER — Telehealth: Payer: Self-pay | Admitting: Family Medicine

## 2013-06-09 MED ORDER — HYDROCODONE-ACETAMINOPHEN 5-325 MG PO TABS: 0.5000 | ORAL_TABLET | Freq: Four times a day (QID) | ORAL | Status: DC | PRN

## 2013-06-09 NOTE — Telephone Encounter (Signed)
Patient Information:  Caller Name: Marchelle Folks  Phone: [phone]  Patient: Justin Lane, Justin Lane  Gender: Male  DOB: 07-Mar-1930  Age: 79 Years  PCP: Crawford Givens Clelia Croft) [facility])  Office Follow Up:  Does the office need to follow up with this patient?: Yes  Instructions For The Office: One call already in regarding pain medication for hip pain which was not primary source of need for RN's call.  Nausea with secondary dizziness.  Please follow up wth patient regarding these concerns.  RN educated for elevated BS since symptoms could be related, but would rather office follow up with family this afternoon.  Please noted assessment notes.  RN Note:  Patient calls about increased nausea this morning, but notes has nausea most every day just worse. Has complained of increased dizziness for past several days.  Also has hip pain he now rates as 5/10 pain scale.  Call from daughter notes that it had been an 8/10 and is not able to sleep in bed but he denies it is really bothering him now.  Daughter notes that current pain meds are not working.  BS this morning is elevated but has not had morning insulin. Which he takes BID 35 unit of Novalog-is not taking Levemir because sugar is low.  Patient states he is following doctor's treatment guidelines for him based on his readings-RN not sure in that wife says she just gives him what he asks for. Not sure if nausea is driven by BS levels since he has it daily nor dizziness.  Triaged using this guideline and CECCs for Diabetes with GI Problems with a disposition to be seen within 72 hours for all other situations.  RN will follow up with office note to request office follow up for hip pain and complaints of nausea/dizziness. Care advice focus was on controlling blood sugar but symptoms may be coming from another source.  Symptoms  Reason For Call & Symptoms: BS this morning was 263;  Pain is located in hip and is nausea.  Is unable to sleep in bed due to pain.   Rate is as 5/10 scale.  Reviewed Health History In EMR: Yes  Reviewed Medications In EMR: Yes  Reviewed Allergies In EMR: Yes  Reviewed Surgeries / Procedures: Yes  Date of Onset of Symptoms: 06/08/2013  Guideline(s) Used:  Diabetes - High Blood Sugar  Disposition Per Guideline:   Home Care  Reason For Disposition Reached:   Blood glucose > 240 mg/dl (13 mmol/l)  Advice Given:  Treatment - Liquids  Drink at least one glass (8 oz or 240 ml) of water per hour for the next 4 hours. (Reason: adequate hydration will reduce hyperglycemia).  Generally, you should try to drink 6-8 glasses of water each day.  Treatment - Insulin  Continue to take your insulin, as prescribed by your doctor.  Sliding Scale Insulin: IF your doctor has given you instructions to take extra rapid-acting (e.g., lispro, aspart) or short acting (regular) insulin when your blood sugar is high, give yourself the insulin dose your doctor has recommended.  Measure and Record Your Blood Glucose  Every day you should measure your blood glucose before breakfast and before going to bed.  Record the results and show them to your doctor at your next office visit.  Call Back If:  You become worse.  Patient Will Follow Care Advice:  YES

## 2013-06-09 NOTE — Telephone Encounter (Signed)
Patient Information:  Caller Name: Marchelle Folks  Phone: [phone]  Patient: [place], Schroll  Gender: Male  DOB: 1930/05/01  Age: 79 Years  PCP: Crawford Givens Clelia Croft) [facility])  Office Follow Up:  Does the office need to follow up with this patient?: Yes PLEASE USE HOME # [phone] WITH CELL SERVICE.   Instructions For The Office: Returning Rena's call.  RN Note:  Daughter in [place], [place] returning Rena's call. Artelia Laroche is at lunch per front desk. Marchelle Folks requesting a call back ASAP regarding new pain med Rx.  Symptoms  Reason For Call & Symptoms: Daughter in law calling regarding pt having severe pain in back and hip. Same as when seen office on 05/31/13. Got better with prednisone but not that Rx is over pain is as bad as it was. Pt is mobile enough to get to the bathroom. Pt is Parkinson's pt.  Was triaged already by Griselda Miner, RN.  Reviewed Health History In EMR: Yes  Reviewed Medications In EMR: Yes  Reviewed Allergies In EMR: Yes  Reviewed Surgeries / Procedures: Yes  Date of Onset of Symptoms: 05/30/2013  Treatments Tried: Giving him an extra Lasix for lower extremity swelling-is helping and Tramadol for pain, which is not touching pain.  Treatments Tried Worked: No  Guideline(s) Used:  Back Pain  No Protocol Available - Sick Adult  Disposition Per Guideline:   Discuss with PCP and Callback by Nurse Today  Reason For Disposition Reached:   Nursing judgment  Advice Given:  N/A  Patient Will Follow Care Advice:  YES

## 2013-06-09 NOTE — Telephone Encounter (Signed)
Marchelle Folks pts daughter; pt saw Dr Para March for severe pain with legs and back. While pt was taking prednisone pt had great relief of the pain;pt has finished the prednisone and now pt is having severe pain again in both legs and back. Tramadol does not relieve pain. Marchelle Folks request different pain med or whatever doctor suggest. pts pain level now is 8. Pt is allergic to codeine. [place].Marchelle Folks request cb ASAP

## 2013-06-09 NOTE — Telephone Encounter (Signed)
Also advised Marchelle Folks in message to call and schedule appt with Dr. Para March for Monday for nausea and dizziness or go to [facility] today or over the weekend if worsening.

## 2013-06-09 NOTE — Telephone Encounter (Signed)
elevated blood sugars may be from prednisone course last month. Really needs eval for nausea and dizziness if persistent. Please have pt schedule appt with PCP for Monday, to seek urgent care today or over weekend if worsening. If pain uncontrolled with tramadol, would recommend trial of hydrocodone or vicodin narcotic - plz phone in.  To only use 1/2 tablet initially to ensure he tolerates it well in h/o codeine intolerance.

## 2013-06-09 NOTE — Telephone Encounter (Signed)
Message left notifying Marchelle Folks. Rx called in as directed.

## 2013-06-10 NOTE — Telephone Encounter (Signed)
Noted, thanks. If pt isn't coming in on Monday, then please get an update. Thanks.

## 2013-06-12 ENCOUNTER — Ambulatory Visit (INDEPENDENT_AMBULATORY_CARE_PROVIDER_SITE_OTHER): Payer: Medicare Other | Admitting: Family Medicine

## 2013-06-12 ENCOUNTER — Encounter: Payer: Self-pay | Admitting: Family Medicine

## 2013-06-12 VITALS — BP 102/40 | HR 80 | Temp 97.4°F | Ht 69.0 in | Wt 178.2 lb

## 2013-06-12 DIAGNOSIS — I10 Essential (primary) hypertension: Secondary | ICD-10-CM

## 2013-06-12 DIAGNOSIS — R1310 Dysphagia, unspecified: Secondary | ICD-10-CM

## 2013-06-12 DIAGNOSIS — L989 Disorder of the skin and subcutaneous tissue, unspecified: Secondary | ICD-10-CM

## 2013-06-12 DIAGNOSIS — R238 Other skin changes: Secondary | ICD-10-CM

## 2013-06-12 DIAGNOSIS — M25559 Pain in unspecified hip: Secondary | ICD-10-CM

## 2013-06-12 MED ORDER — CARVEDILOL 6.25 MG PO TABS: 6.2500 mg | ORAL_TABLET | Freq: Two times a day (BID) | ORAL | Status: DC

## 2013-06-12 MED ORDER — DOCUSATE SODIUM 100 MG PO CAPS: 100.0000 mg | ORAL_CAPSULE | Freq: Every day | ORAL | Status: DC

## 2013-06-12 NOTE — Patient Instructions (Addendum)
Use barrier cream and a doughnut pillow.  Take 6.25mg  of carvedilol twice a day.  See if that helps with the lightheadedness.  If not, let me know.  Take the hydrocodone (1/2 tab) before bed.  Take care.

## 2013-06-12 NOTE — Telephone Encounter (Signed)
Patient scheduled for OV on 06/12/13.

## 2013-06-13 DIAGNOSIS — R1310 Dysphagia, unspecified: Secondary | ICD-10-CM | POA: Insufficient documentation

## 2013-06-13 DIAGNOSIS — R238 Other skin changes: Secondary | ICD-10-CM | POA: Insufficient documentation

## 2013-06-13 NOTE — Assessment & Plan Note (Signed)
Continue prn vicodin with 1/2 tab at night before bed to help rest.  Constipation and sedation caution given, start colace.

## 2013-06-13 NOTE — Progress Notes (Signed)
Still with orthostatic sx.  Had BB dropped to 12.5mg  BID.  Still needing lasix.    Back and hip pain.  Off pred, prev with glucose elevation then lower numbers recently.  pred helped with pain.  Tramadol didn't help.  Less pain with vicodin, taking about 1/2 tab a day, usually in AM.  Pain at night, trouble getting comfortable.  Hasn't used before bed.  No new sx, just continued pain in prev distribution.    Dysphagia noted, with larger bolus of food.  occ sensation of food sticking.  No choking.  Asking for options.  Sig trouble for patient to be able to leave the home due to parkinsonism.    Meds, vitals, and allergies reviewed.   ROS: See HPI.  Otherwise, noncontributory.  nad Tremor at baseline Mmm rrr ctab abd soft 1+ edema in BLE Gluteal crease with irritation but no skin breakdown.

## 2013-06-13 NOTE — Assessment & Plan Note (Signed)
Noted with larger bolus of food.  Occ sensation of food sticking.  No florid choking. Sig trouble for patient to be able to leave the home due to parkinsonism.  Will ask for [facility] to see pt.

## 2013-06-13 NOTE — Assessment & Plan Note (Signed)
Doesn't appear infected, offload with doughnut pillow and use barrier cream.  Fu prn.

## 2013-06-13 NOTE — Assessment & Plan Note (Signed)
orthostasis continues, cut the coreg to 6.25mg  BID.  Continue prn lasix.  Call back with update as needed.  He and family agree.

## 2013-06-19 ENCOUNTER — Other Ambulatory Visit: Payer: Medicare Other

## 2013-06-19 ENCOUNTER — Ambulatory Visit (INDEPENDENT_AMBULATORY_CARE_PROVIDER_SITE_OTHER): Payer: Medicare Other | Admitting: Family Medicine

## 2013-06-19 DIAGNOSIS — I498 Other specified cardiac arrhythmias: Secondary | ICD-10-CM

## 2013-06-20 ENCOUNTER — Telehealth: Payer: Self-pay

## 2013-06-20 NOTE — Telephone Encounter (Signed)
Please give the verbal order.  Thanks.  

## 2013-06-20 NOTE — Telephone Encounter (Signed)
Revonda Standard speech therapist with [facility] saw pt for start of care for speech therapy and Revonda Standard request verbal order for PT home health evaluation due to multiple risk factors for pt falling.Please advise.

## 2013-06-20 NOTE — Telephone Encounter (Signed)
Left detailed message on voicemail.  

## 2013-06-26 ENCOUNTER — Ambulatory Visit: Payer: Medicare Other | Admitting: Family Medicine

## 2013-06-27 ENCOUNTER — Telehealth: Payer: Self-pay

## 2013-06-27 NOTE — Telephone Encounter (Signed)
Revonda Standard Speech therapist with [facility] left v/m; on 06/23/13 and 06/26/13 pt's BP ws 101/47 and on 06/27/13 BP was 108/42. Revonda Standard said BP was taken between 9:30 - 11:30 am on all three days.Please advise.

## 2013-06-27 NOTE — Telephone Encounter (Signed)
Is he symptomatic with these bp readings or feeling ok ?

## 2013-06-28 NOTE — Telephone Encounter (Signed)
In that case-let's hold this until the PCP Dr Para March returns -I do not know whether pt's coreg can be safely adj further or not - if things worsen however please let me know asap

## 2013-06-28 NOTE — Telephone Encounter (Signed)
If the sx are tolerable and not worsening, then I would continue as is.  If the sx are worsening, then we'll need to make some decisions about cutting his med doses.  Let me know. Thanks.

## 2013-06-28 NOTE — Telephone Encounter (Signed)
Left voicemail on Justin Lane's phone letting her know we are going to wait until Dr. Para March returns to make a plan but if any new sxs or worsening sxs to let us know asap

## 2013-06-28 NOTE — Telephone Encounter (Signed)
Spoke to [place] (speech therapist) and was advised that the only complaint that patient has is dizziness when standing at times. Patient told her that this is not new and has been going on for a while. Revonda Standard stated that the patient told her that his BP is normally not that low.

## 2013-06-29 NOTE — Telephone Encounter (Signed)
Patient advised.

## 2013-06-30 NOTE — Telephone Encounter (Signed)
I would hold his coreg for the weekend- update [place] if any changes and I will forward to Dr Para March so her is aware and can advise you further when he returns

## 2013-06-30 NOTE — Telephone Encounter (Signed)
Stacy PT with [facility] left v/m; Kennyth Arnold is concerned about trend of low BP, today BP 100/40; Stacy said pt is symptomatic and symptoms do pass but pt had a fall this week when tried to get out of bed and fell with bruising, no major injury but Stacy request cb.

## 2013-07-02 NOTE — Telephone Encounter (Signed)
Please call and get update on patient, BP and pulse.  See how he is feeling off the coreg.  Thanks.

## 2013-07-03 NOTE — Telephone Encounter (Signed)
Tried to phone home number 3 times during the day (am, noon, pm), no answer, no VM.

## 2013-07-04 NOTE — Telephone Encounter (Signed)
Agreed, thanks

## 2013-07-04 NOTE — Telephone Encounter (Signed)
I don't know where the message from Dr. Milinda Antis was routed but apparently the message was not given to the patient so he has not held the Coreg.  He says his BP and pulse is still low so I advised him to hold the Coreg for the next few days and call us on Thursday or Friday to give [place] an update.

## 2013-07-07 ENCOUNTER — Telehealth: Payer: Self-pay

## 2013-07-07 NOTE — Telephone Encounter (Signed)
Stacy advised.  She states that she thinks he needs to be evaluated here in the office.  He is declining quickly.  Will call his home to see about setting up appt.

## 2013-07-07 NOTE — Telephone Encounter (Signed)
Please give verbal order for home health nurse, rolling walker and w/c.  Fall risk.  What are his BP and pulse readings now?  Let me know.  Thanks.

## 2013-07-07 NOTE — Telephone Encounter (Signed)
Justin Lane, PT [facility] left v/m; Justin Lane has ongoing concerns about pt's BP, dizziness,falling issue and new problem of bedsore on buttock; Stacy request verbal order for home health nurse, rolling walker and w/c.Please advise.

## 2013-07-07 NOTE — Telephone Encounter (Signed)
Agreed, I'd like to see him in clinic.  Thanks.

## 2013-07-08 ENCOUNTER — Emergency Department (HOSPITAL_COMMUNITY): Payer: Medicare Other

## 2013-07-08 ENCOUNTER — Encounter (HOSPITAL_COMMUNITY): Payer: Self-pay

## 2013-07-08 ENCOUNTER — Emergency Department (HOSPITAL_COMMUNITY)
Admission: EM | Admit: 2013-07-08 | Discharge: 2013-07-08 | Disposition: A | Discharge status: Home / Self Care | Payer: Medicare Other | Attending: Emergency Medicine | Admitting: Emergency Medicine

## 2013-07-08 DIAGNOSIS — J329 Chronic sinusitis, unspecified: Secondary | ICD-10-CM

## 2013-07-08 DIAGNOSIS — E1149 Type 2 diabetes mellitus with other diabetic neurological complication: Secondary | ICD-10-CM

## 2013-07-08 DIAGNOSIS — I4891 Unspecified atrial fibrillation: Secondary | ICD-10-CM | POA: Insufficient documentation

## 2013-07-08 DIAGNOSIS — R443 Hallucinations, unspecified: Secondary | ICD-10-CM | POA: Insufficient documentation

## 2013-07-08 DIAGNOSIS — E039 Hypothyroidism, unspecified: Secondary | ICD-10-CM | POA: Insufficient documentation

## 2013-07-08 DIAGNOSIS — I739 Peripheral vascular disease, unspecified: Secondary | ICD-10-CM | POA: Insufficient documentation

## 2013-07-08 DIAGNOSIS — N189 Chronic kidney disease, unspecified: Secondary | ICD-10-CM | POA: Insufficient documentation

## 2013-07-08 DIAGNOSIS — R4182 Altered mental status, unspecified: Secondary | ICD-10-CM

## 2013-07-08 DIAGNOSIS — D649 Anemia, unspecified: Secondary | ICD-10-CM | POA: Insufficient documentation

## 2013-07-08 DIAGNOSIS — Z79899 Other long term (current) drug therapy: Secondary | ICD-10-CM | POA: Insufficient documentation

## 2013-07-08 DIAGNOSIS — M109 Gout, unspecified: Secondary | ICD-10-CM | POA: Insufficient documentation

## 2013-07-08 DIAGNOSIS — Z8719 Personal history of other diseases of the digestive system: Secondary | ICD-10-CM | POA: Insufficient documentation

## 2013-07-08 DIAGNOSIS — Z7901 Long term (current) use of anticoagulants: Secondary | ICD-10-CM | POA: Insufficient documentation

## 2013-07-08 DIAGNOSIS — G988 Other disorders of nervous system: Secondary | ICD-10-CM

## 2013-07-08 DIAGNOSIS — Z87891 Personal history of nicotine dependence: Secondary | ICD-10-CM | POA: Insufficient documentation

## 2013-07-08 DIAGNOSIS — S79919A Unspecified injury of unspecified hip, initial encounter: Secondary | ICD-10-CM | POA: Insufficient documentation

## 2013-07-08 DIAGNOSIS — I2510 Atherosclerotic heart disease of native coronary artery without angina pectoris: Secondary | ICD-10-CM | POA: Insufficient documentation

## 2013-07-08 DIAGNOSIS — Y939 Activity, unspecified: Secondary | ICD-10-CM | POA: Insufficient documentation

## 2013-07-08 DIAGNOSIS — Z7982 Long term (current) use of aspirin: Secondary | ICD-10-CM | POA: Insufficient documentation

## 2013-07-08 DIAGNOSIS — Y929 Unspecified place or not applicable: Secondary | ICD-10-CM | POA: Insufficient documentation

## 2013-07-08 DIAGNOSIS — Z8739 Personal history of other diseases of the musculoskeletal system and connective tissue: Secondary | ICD-10-CM | POA: Insufficient documentation

## 2013-07-08 DIAGNOSIS — E785 Hyperlipidemia, unspecified: Secondary | ICD-10-CM | POA: Insufficient documentation

## 2013-07-08 DIAGNOSIS — E119 Type 2 diabetes mellitus without complications: Secondary | ICD-10-CM | POA: Insufficient documentation

## 2013-07-08 DIAGNOSIS — I129 Hypertensive chronic kidney disease with stage 1 through stage 4 chronic kidney disease, or unspecified chronic kidney disease: Secondary | ICD-10-CM | POA: Insufficient documentation

## 2013-07-08 DIAGNOSIS — W07XXXA Fall from chair, initial encounter: Secondary | ICD-10-CM | POA: Insufficient documentation

## 2013-07-08 DIAGNOSIS — Z87442 Personal history of urinary calculi: Secondary | ICD-10-CM | POA: Insufficient documentation

## 2013-07-08 DIAGNOSIS — Z794 Long term (current) use of insulin: Secondary | ICD-10-CM | POA: Insufficient documentation

## 2013-07-08 DIAGNOSIS — Z9861 Coronary angioplasty status: Secondary | ICD-10-CM | POA: Insufficient documentation

## 2013-07-08 LAB — COMPREHENSIVE METABOLIC PANEL
ALT: 7 U/L (ref 0–53)
Alkaline Phosphatase: 92 U/L (ref 39–117)
BUN: 29 mg/dL — ABNORMAL HIGH (ref 6–23)
CO2: 23 mEq/L (ref 19–32)
Calcium: 9.2 mg/dL (ref 8.4–10.5)
Creatinine, Ser: 1.21 mg/dL (ref 0.50–1.35)
GFR calc Af Amer: 63 mL/min — ABNORMAL LOW (ref >90)
GFR calc non Af Amer: 54 mL/min — ABNORMAL LOW (ref >90)
Glucose, Bld: 172 mg/dL — ABNORMAL HIGH (ref 70–99)
Potassium: 4 mEq/L (ref 3.5–5.1)
Sodium: 139 mEq/L (ref 135–145)
Total Protein: 6.8 g/dL (ref 6.0–8.3)

## 2013-07-08 LAB — CBC WITH DIFFERENTIAL/PLATELET
Basophils Relative: 0 % (ref 0–1)
Eosinophils Absolute: 0 10*3/uL (ref 0.0–0.7)
Eosinophils Relative: 0 % (ref 0–5)
HCT: 34.2 % — ABNORMAL LOW (ref 39.0–52.0)
Hemoglobin: 11.9 g/dL — ABNORMAL LOW (ref 13.0–17.0)
Lymphocytes Relative: 18 % (ref 12–46)
Lymphs Abs: 1.6 10*3/uL (ref 0.7–4.0)
MCH: 31.4 pg (ref 26.0–34.0)
MCHC: 34.8 g/dL (ref 30.0–36.0)
MCV: 90.2 fL (ref 78.0–100.0)
Monocytes Absolute: 1 10*3/uL (ref 0.1–1.0)
Monocytes Relative: 11 % (ref 3–12)
Neutrophils Relative %: 70 % (ref 43–77)
Platelets: 199 10*3/uL (ref 150–400)
RBC: 3.79 MIL/uL — ABNORMAL LOW (ref 4.22–5.81)
RDW: 13.9 % (ref 11.5–15.5)
WBC: 8.6 10*3/uL (ref 4.0–10.5)

## 2013-07-08 LAB — URINALYSIS, ROUTINE W REFLEX MICROSCOPIC
Bilirubin Urine: NEGATIVE
Glucose, UA: NEGATIVE mg/dL
Ketones, ur: NEGATIVE mg/dL
Nitrite: NEGATIVE
Protein, ur: NEGATIVE mg/dL
pH: 5.5 (ref 5.0–8.0)

## 2013-07-08 LAB — TROPONIN I: Troponin I: <0.3 ng/mL (ref <0.30)

## 2013-07-08 LAB — PROTIME-INR: Prothrombin Time: 25.7 seconds — ABNORMAL HIGH (ref 11.6–15.2)

## 2013-07-08 MED ORDER — OXYMETAZOLINE HCL 0.05 % NA SOLN: 2.0000 | Freq: Two times a day (BID) | NASAL | Status: DC

## 2013-07-08 MED ORDER — ACETAMINOPHEN 325 MG PO TABS
650.0000 mg | ORAL_TABLET | Freq: Once | ORAL | Status: AC
  Administered 2013-07-08: 650 mg via ORAL
  Filled 2013-07-08: qty 2

## 2013-07-08 MED ORDER — LEVOFLOXACIN 500 MG PO TABS
500.0000 mg | ORAL_TABLET | Freq: Once | ORAL | Status: AC
  Administered 2013-07-08: 500 mg via ORAL
  Filled 2013-07-08: qty 1

## 2013-07-08 MED ORDER — TRAMADOL HCL 50 MG PO TABS
50.0000 mg | ORAL_TABLET | Freq: Once | ORAL | Status: AC
  Administered 2013-07-08: 50 mg via ORAL
  Filled 2013-07-08: qty 1

## 2013-07-08 MED ORDER — LEVOFLOXACIN 500 MG PO TABS: 500.0000 mg | ORAL_TABLET | Freq: Every day | ORAL | Status: DC

## 2013-07-08 MED ORDER — MOMETASONE FUROATE 50 MCG/ACT NA SUSP: 2.0000 | Freq: Every day | NASAL | Status: DC

## 2013-07-08 MED ORDER — LEVOFLOXACIN IN D5W 750 MG/150ML IV SOLN
750.0000 mg | Freq: Once | INTRAVENOUS | Status: DC
  Filled 2013-07-08: qty 150

## 2013-07-08 MED ORDER — SODIUM CHLORIDE 0.9 % IV BOLUS (SEPSIS)
1000.0000 mL | Freq: Once | INTRAVENOUS | Status: AC
  Administered 2013-07-08: 1000 mL via INTRAVENOUS

## 2013-07-08 NOTE — ED Notes (Signed)
Patient transported to CT 

## 2013-07-08 NOTE — ED Notes (Signed)
Pt arrives to the ED via EMS from a fall out of the recliner.  Right hip pain but no LOC.  Pt also has some altered mental status

## 2013-07-08 NOTE — Consult Note (Signed)
[facility] History and Physical  Justin Lane ZOX:[id number] DOB: Sep 10, 1930    PCP:   Justin Givens, MD   Chief Complaint: intermittent delerium.  HPI: Justin Lane is an 79 y.o. male with hx of Parkinson's disease, DM2, hypertension with recent episodes of hypotension and dizziness, hyperlipidemia GERD, hypothyroidism, brought to the ER as he has 2 days of intermittent confusion and delirium.  He said he saw a cat which was not there.  He denied fever, chills, HA, shortness of breath or chest pain.  He has been taking his medication on his own.  Because his BP was borderline low, PCP recomemded that he reduces his betablocker.  Evaluation in the ER inlcuded normal WBC, electrolytes, and clear CXR.  His head CT showed moderate left maxillary sinusitis.  In the ER, he is perfectly lucid and was able to converse meaningfully. He has been on anticoagulation for afib.  He has increased episodes of fallings.  Rewiew of Systems:  Constitutional: Negative for malaise, fever and chills. No significant weight loss or weight gain Eyes: Negative for eye pain, redness and discharge, diplopia, visual changes, or flashes of light. ENMT: Negative for ear pain, hoarseness, nasal congestion, sinus pressure and sore throat. No headaches; tinnitus, drooling, or problem swallowing. Cardiovascular: Negative for chest pain, palpitations, diaphoresis, dyspnea and peripheral edema. ; No orthopnea, PND Respiratory: Negative for cough, hemoptysis, wheezing and stridor. No pleuritic chestpain. Gastrointestinal: Negative for nausea, vomiting, diarrhea, constipation, abdominal pain, melena, blood in stool, hematemesis, jaundice and rectal bleeding.    Genitourinary: Negative for frequency, dysuria, incontinence,flank pain and hematuria; Musculoskeletal: Negative for back pain and neck pain. Negative for swelling and trauma.;  Skin: . Negative for pruritus, rash, abrasions, bruising and skin lesion.;  ulcerations Neuro: Negative for headache, lightheadedness and neck stiffness. Negative for weakness, altered level of consciousness ,extremity weakness, burning feet, involuntary movement, seizure and syncope.  Psych: negative for anxiety, depression, insomnia, tearfulness, panic attacks,paranoia, suicidal or homicidal ideation.   Past Medical History  Diagnosis Date  . GERD (gastroesophageal reflux disease)   . Hypothyroidism   . Anemia     Mild  . Hypertension   . Diabetes mellitus     Goal A1c ~8 due to concern for hypoglycemia  . Hyperlipidemia   . PVD (peripheral vascular disease)     Status post bilateral renal stenting, Justin Lane's  . Paroxysmal atrial fibrillation     On Coumadin and rate control meds  . CAD (coronary artery disease)     a. Inf MI 1997 tx with POBA;  b. s/p BMS to RCA for restonosis;  b. NSTEMI 9/04 => LHC 06/22/03:  LM 30%, LAD and CFX irregs, mRCA 80% ISR, EF 60% => PCI: Taxus DES to mRCA  . Hemorrhoids   . Gastritis 06/24/03 & 12/08/06    EGD Justin Lane)  . Hiatal hernia   . Gout   . Vitamin B 12 deficiency   . BPH (benign prostatic hyperplasia)   . Diverticular disease     via ACBE  . Herniated disc     L4, L5  . Near syncope 10/07/09  . Parkinson's disease     Per Dr. Terrace Lane  . Chronic kidney disease     Chronic, with a creatinine in the range of 1.2-1.6;  hx of ARF 11/2009  . Renal calculus     Recurrent lithotripsy  . Hx of echocardiogram     a. Echo 10/2009:  mild LVH, EF 55-60%, b.  Echo 11/13:  mild LVH, EF 70-75%, mid cavity dynamic obstruction wth Valsalva (pk velocity 307 cm/sec, pk gradient 38 mmHg), mild MS, mean gradient 7 mmHg    Past Surgical History  Procedure Laterality Date  . Angioplasty  10/97    Multiple   . Lithotripsy  04/29/00    Due to renal lithiasis   . Cardiac catheterization  06/22/03    and PICA for in-stent restenosis  . Iliac vein angioplasty / stenting  05/16/04    Bilateral  . Laminectomy  09/28/07    L4,L5  (Dr. Ophelia Lane)   . Cholecystectomy open  10/23/2009    Attempted lap, purulent GB (Dr. Dwain Lane)  . Cataract extraction      right eye 2013    Medications:  HOME MEDS: Prior to Admission medications   Medication Sig Start Date End Date Taking? Authorizing Provider  acetaminophen (TYLENOL) 500 MG tablet Take 500 mg by mouth. As needed.   Yes Historical Provider, MD  aspirin 81 MG tablet Take 81 mg by mouth daily.    Yes Historical Provider, MD  atorvastatin (LIPITOR) 80 MG tablet take 1 tablet by mouth every evening 09/29/12  Yes Joaquim Nam, MD  AVODART 0.5 MG capsule take 1 capsule by mouth once daily 11/20/12  Yes Joaquim Nam, MD  carbidopa-levodopa (SINEMET CR) 50-200 MG per tablet Take 1 tablet by mouth at bedtime. 04/18/13  Yes Nilda Riggs, NP  carbidopa-levodopa (SINEMET IR) 25-100 MG per tablet Take 1 tablet by mouth 3 (three) times daily. 1 tab at 8am, 1 pm, 6pm 04/18/13  Yes Nilda Riggs, NP  carvedilol (COREG) 6.25 MG tablet Take 1 tablet (6.25 mg total) by mouth 2 (two) times daily with a meal. 06/12/13  Yes Joaquim Nam, MD  Cholecalciferol (VITAMIN D3) 1000 UNITS capsule Take 1,000 Units by mouth daily.     Yes Historical Provider, MD  cilostazol (PLETAL) 100 MG tablet take 1 tablet by mouth twice a day 01/10/13  Yes Joaquim Nam, MD  cyanocobalamin 500 MCG tablet Take 1,000 mcg by mouth daily.    Yes Historical Provider, MD  digoxin (LANOXIN) 0.125 MG tablet Take 1 tablet (125 mcg total) by mouth daily. 06/02/13  Yes Kathleene Hazel, MD  docusate sodium (COLACE) 100 MG capsule Take 1 capsule (100 mg total) by mouth daily. 06/12/13  Yes Joaquim Nam, MD  donepezil (ARICEPT) 10 MG tablet Take 1 tablet (10 mg total) by mouth daily. 04/18/13  Yes Nilda Riggs, NP  Ferrous Sulfate (RA IRON) 27 MG TABS Take 1 tablet by mouth daily.     Yes Historical Provider, MD  furosemide (LASIX) 40 MG tablet Take 1 a day.  If you have more swelling, you can take a second  dose if needed. 05/17/13  Yes Kathleene Hazel, MD  HYDROcodone-acetaminophen (NORCO/VICODIN) 5-325 MG per tablet Take 0.5-1 tablets by mouth every 6 (six) hours as needed for pain. 06/09/13  Yes Eustaquio Boyden, MD  insulin aspart (NOVOLOG) 100 UNIT/ML injection 35 UNITS EVERY MORNING AND 35 UNITS EVERY PM 12/30/12  Yes Joaquim Nam, MD  insulin aspart (NOVOLOG) 100 UNIT/ML injection Inject 12 Units into the skin 2 (two) times daily.   Yes Historical Provider, MD  insulin detemir (LEVEMIR) 100 UNIT/ML injection Inject 5 units subcutaneously once daily 04/26/12  Yes Joaquim Nam, MD  levothyroxine (SYNTHROID, LEVOTHROID) 25 MCG tablet TAKE 1 TABLET BY MOUTH DAILY 05/29/13  Yes Joaquim Nam, MD  potassium chloride SA (  K-DUR,KLOR-CON) 20 MEQ tablet Take 1 tablet (20 mEq total) by mouth daily. 09/06/12  Yes Lewayne Bunting, MD  traMADol (ULTRAM) 50 MG tablet Take 50 mg by mouth every 6 (six) hours as needed for pain.   Yes Historical Provider, MD  nitroGLYCERIN (NITROSTAT) 0.3 MG SL tablet Place 1 tablet (0.3 mg total) under the tongue every 5 (five) minutes as needed. 05/23/12   Kathleene Hazel, MD  warfarin (COUMADIN) 5 MG tablet Use as directed by Anticoagulation Clinic 06/02/13   Kathleene Hazel, MD     Allergies:  Allergies  Allergen Reactions  . Codeine Sulfate     REACTION: nausea and vomiting    Social History:   reports that he quit smoking about 26 years ago. He has never used smokeless tobacco. He reports that he does not drink alcohol or use illicit drugs.  Family History: Family History  Problem Relation Age of Onset  . Heart disease Mother   . Cancer Father     ? Skin  . Diabetes Sister   . Heart disease Brother   . Heart disease Brother      Physical Exam: Filed Vitals:   07/08/13 0102 07/08/13 0230  BP: 145/55 151/60  Pulse: 116 101  Temp: 101.3 F (38.5 C)   TempSrc: Rectal   Resp: 18 20  SpO2: 97% 98%   Blood pressure 151/60, pulse  101, temperature 101.3 F (38.5 C), temperature source Rectal, resp. rate 20, SpO2 98.00%.  GEN:  Pleasant  patient lying in the stretcher in no acute distress; cooperative with exam. PSYCH:  alert and oriented x4; does not appear anxious or depressed; affect is appropriate. HEENT: Mucous membranes pink and anicteric; PERRLA; EOM intact; no cervical lymphadenopathy nor thyromegaly or carotid bruit; no JVD; There were no stridor. Neck is very supple. Breasts:: Not examined CHEST WALL: No tenderness CHEST: Normal respiration, clear to auscultation bilaterally.  HEART: Regular rate and rhythm.  There are no murmur, rub, or gallops.   BACK: No kyphosis or scoliosis; no CVA tenderness ABDOMEN: soft and non-tender; no masses, no organomegaly, normal abdominal bowel sounds; no pannus; no intertriginous candida. There is no rebound and no distention. Rectal Exam: Not done EXTREMITIES: No bone or joint deformity; age-appropriate arthropathy of the hands and knees; no edema; no ulcerations.  There is no calf tenderness. Genitalia: not examined PULSES: 2+ and symmetric SKIN: Normal hydration no rash or ulceration CNS: Cranial nerves 2-12 grossly intact no focal lateralizing neurologic deficit.  Speech is fluent; uvula elevated with phonation, facial symmetry and tongue midline. DTR are normal bilaterally, cerebella exam is intact, barbinski is negative and strengths are equaled bilaterally.  No sensory loss. He has 5 Hertz tremor.   Labs on Admission:  Basic Metabolic Panel:  Recent Labs Lab 07/08/13 0137  NA 139  K 4.0  CL 100  CO2 23  GLUCOSE 172*  BUN 29*  CREATININE 1.21  CALCIUM 9.2   Liver Function Tests:  Recent Labs Lab 07/08/13 0137  AST 20  ALT 7  ALKPHOS 92  BILITOT 0.9  PROT 6.8  ALBUMIN 3.2*   No results found for this basename: LIPASE, AMYLASE,  in the last 168 hours No results found for this basename: AMMONIA,  in the last 168 hours CBC:  Recent Labs Lab  07/08/13 0137  WBC 8.6  NEUTROABS 6.0  HGB 11.9*  HCT 34.2*  MCV 90.2  PLT 199   Cardiac Enzymes:  Recent Labs Lab 07/08/13 0138  TROPONINI <0.30    CBG: No results found for this basename: GLUCAP,  in the last 168 hours   Radiological Exams on Admission: Ct Head Wo Contrast  07/08/2013   *RADIOLOGY REPORT*  Clinical Data: Fall, altered mental status.  The  CT HEAD WITHOUT CONTRAST  Technique:  Contiguous axial images were obtained from the base of the skull through the vertex without contrast.  Comparison: None available at time of study interpretation.  Findings: The ventricles and sulci are normal for age.  No intraparenchymal hemorrhage, mass effect nor midline shift.  Patchy supratentorial white matter hypodensities are within normal range for patient's age and though non-specific suggest sequalae of chronic small vessel ischemic disease. No acute large vascular territory infarcts.  No abnormal extra-axial fluid collections.  Basal cisterns are patent.  Severe calcific atheroma atherosclerosis of the carotid siphons, to a lesser extent vertebral arteries.  No skull fracture.  Visualized paranasal sinuses and mastoid aircells are well-aerated. Status post bilateral ocular lens implants.  Moderate left maxillary sinusitis without paranasal sinus air-fluid levels, trace ethmoid mucosal thickening. 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  IMPRESSION: No acute intracranial process.  Moderate left maxillary sinusitis .  Involutional changes, mild to moderate white matter changes suggestive chronic small vessel ischemic disease.   Original Report Authenticated By: Awilda Metro   Dg Chest Port 1 View  (if Code Sepsis Called)  07/08/2013   *RADIOLOGY REPORT*  Clinical Data: Fall, altered mental status.  PORTABLE CHEST - 1 VIEW  Comparison: Chest radiograph November 01, 2009  Findings: The cardiomediastinal silhouette is non  suspicious, mild calcific atherosclerosis of the aortic knob as previously seen. The lungs are clear without pleural effusions or focal consolidations.  The pulmonary vasculature is unremarkable. Trachea projects midline and there is no pneumothorax.  The included soft tissue planes and osseous structures are unremarkable.  Multiple EKG lines overlay the patient and could obscure underlying subtle pathology.  IMPRESSION: No acute cardiopulmonary process.  Improved aeration of the lungs from November 01, 2009.   Original Report Authenticated By: Awilda Metro    Assessment/Plan Intermittent delirium Parkinson's disease Left maxillary sinusitis. Hypertension  PLAN:  He had intermittent delirium which I suspect may be from incorrectly taking his sinemet.  He is going to have a pill box set up for that.  I offered to admit him for OBS, or discharged him for follow up with his PCP and he and his family would prefer to go home.  I think since he is so lucid now, it is very reasonable.  I recommended that his sinusitis be treated, and he was given Levaquin.  I also recommend that he speak with his PCP concerning anticoagulation risk if his fall risk should increase.  Lastly, he should decrease his BP medication as per his PCP, since it was borderline low.  In the ER, at discharge, his BP was 140 systolic. He will be discharged for follow up with his PCP.  Thank you for allowing me to participate in his care.  Other plans as per orders.  Code Status: FULL Unk Lightning, MD. [facility] Pager [phone] 7pm to 7am.  07/08/2013, 4:16 AM

## 2013-07-08 NOTE — ED Provider Notes (Signed)
CSN: [id number]     Arrival date & time 07/08/13  0100 History   First MD Initiated Contact with Patient 07/08/13 0106     Chief Complaint  Patient presents with  . Fall  . Altered Mental Status   (Consider location/radiation/quality/duration/timing/severity/associated sxs/prior Treatment) HPI Patient is an 79 year old male presents with family for increased confusion for the past 3 days. Daughter states the patient has been hallucinating recently. This evening the patient slid out of his chair. There was no head or neck trauma. Was noted that the patient had a fever. He denied any chest pain, shortness of breath, cough, vomiting, diarrhea, urinary symptoms, abdominal pain.  Past Medical History  Diagnosis Date  . GERD (gastroesophageal reflux disease)   . Hypothyroidism   . Anemia     Mild  . Hypertension   . Diabetes mellitus     Goal A1c ~8 due to concern for hypoglycemia  . Hyperlipidemia   . PVD (peripheral vascular disease)     Status post bilateral renal stenting, LE's  . Paroxysmal atrial fibrillation     On Coumadin and rate control meds  . CAD (coronary artery disease)     a. Inf MI 1997 tx with POBA;  b. s/p BMS to RCA for restonosis;  b. NSTEMI 9/04 => LHC 06/22/03:  LM 30%, LAD and CFX irregs, mRCA 80% ISR, EF 60% => PCI: Taxus DES to mRCA  . Hemorrhoids   . Gastritis 06/24/03 & 12/08/06    EGD Christella Hartigan)  . Hiatal hernia   . Gout   . Vitamin B 12 deficiency   . BPH (benign prostatic hyperplasia)   . Diverticular disease     via ACBE  . Herniated disc     L4, L5  . Near syncope 10/07/09  . Parkinson's disease     Per Dr. Terrace Arabia  . Chronic kidney disease     Chronic, with a creatinine in the range of 1.2-1.6;  hx of ARF 11/2009  . Renal calculus     Recurrent lithotripsy  . Hx of echocardiogram     a. Echo 10/2009:  mild LVH, EF 55-60%, b.  Echo 11/13:  mild LVH, EF 70-75%, mid cavity dynamic obstruction wth Valsalva (pk velocity 307 cm/sec, pk gradient 38 mmHg), mild  MS, mean gradient 7 mmHg   Past Surgical History  Procedure Laterality Date  . Angioplasty  10/97    Multiple   . Lithotripsy  04/29/00    Due to renal lithiasis   . Cardiac catheterization  06/22/03    and PICA for in-stent restenosis  . Iliac vein angioplasty / stenting  05/16/04    Bilateral  . Laminectomy  09/28/07    L4,L5  (Dr. Ophelia Charter)  . Cholecystectomy open  10/23/2009    Attempted lap, purulent GB (Dr. Dwain Sarna)  . Cataract extraction      right eye 2013   Family History  Problem Relation Age of Onset  . Heart disease Mother   . Cancer Father     ? Skin  . Diabetes Sister   . Heart disease Brother   . Heart disease Brother    History  Substance Use Topics  . Smoking status: Former Smoker    Quit date: 11/11/1986  . Smokeless tobacco: Never Used     Comment: Quit after MI  . Alcohol Use: No    Review of Systems  Constitutional: Positive for fever. Negative for chills.  HENT: Negative for neck pain.   Respiratory: Negative  for cough and shortness of breath.   Cardiovascular: Negative for chest pain.  Gastrointestinal: Negative for nausea, vomiting, abdominal pain and diarrhea.  Genitourinary: Negative for dysuria, frequency and flank pain.  Musculoskeletal: Negative for back pain.  Skin: Negative for rash and wound.  Neurological: Negative for dizziness, weakness, light-headedness, numbness and headaches.  Psychiatric/Behavioral: Positive for hallucinations and confusion.  All other systems reviewed and are negative.    Allergies  Codeine sulfate  Home Medications   Current Outpatient Rx  Name  Route  Sig  Dispense  Refill  . acetaminophen (TYLENOL) 500 MG tablet   Oral   Take 500 mg by mouth. As needed.         Marland Kitchen aspirin 81 MG tablet   Oral   Take 81 mg by mouth daily.          Marland Kitchen atorvastatin (LIPITOR) 80 MG tablet      take 1 tablet by mouth every evening   30 tablet   12   . AVODART 0.5 MG capsule      take 1 capsule by mouth once  daily   30 capsule   12   . carbidopa-levodopa (SINEMET CR) 50-200 MG per tablet   Oral   Take 1 tablet by mouth at bedtime.   30 tablet   6   . carbidopa-levodopa (SINEMET IR) 25-100 MG per tablet   Oral   Take 1 tablet by mouth 3 (three) times daily. 1 tab at 8am, 1 pm, 6pm   90 tablet   6   . carvedilol (COREG) 6.25 MG tablet   Oral   Take 1 tablet (6.25 mg total) by mouth 2 (two) times daily with a meal.   60 tablet   12   . Cholecalciferol (VITAMIN D3) 1000 UNITS capsule   Oral   Take 1,000 Units by mouth daily.           . cilostazol (PLETAL) 100 MG tablet      take 1 tablet by mouth twice a day   60 tablet   5   . cyanocobalamin 500 MCG tablet   Oral   Take 1,000 mcg by mouth daily.          . digoxin (LANOXIN) 0.125 MG tablet   Oral   Take 1 tablet (125 mcg total) by mouth daily.   30 tablet   5   . docusate sodium (COLACE) 100 MG capsule   Oral   Take 1 capsule (100 mg total) by mouth daily.         Marland Kitchen donepezil (ARICEPT) 10 MG tablet   Oral   Take 1 tablet (10 mg total) by mouth daily.   30 tablet   6   . Ferrous Sulfate (RA IRON) 27 MG TABS   Oral   Take 1 tablet by mouth daily.           . furosemide (LASIX) 40 MG tablet      Take 1 a day.  If you have more swelling, you can take a second dose if needed.   30 tablet   3   . HYDROcodone-acetaminophen (NORCO/VICODIN) 5-325 MG per tablet   Oral   Take 0.5-1 tablets by mouth every 6 (six) hours as needed for pain.   30 tablet   0   . insulin aspart (NOVOLOG) 100 UNIT/ML injection      35 UNITS EVERY MORNING AND 35 UNITS EVERY PM   30 mL   6   .  insulin aspart (NOVOLOG) 100 UNIT/ML injection   Subcutaneous   Inject 12 Units into the skin 2 (two) times daily.         . insulin detemir (LEVEMIR) 100 UNIT/ML injection      Inject 5 units subcutaneously once daily         . levothyroxine (SYNTHROID, LEVOTHROID) 25 MCG tablet      TAKE 1 TABLET BY MOUTH DAILY   30  tablet   6   . potassium chloride SA (K-DUR,KLOR-CON) 20 MEQ tablet   Oral   Take 1 tablet (20 mEq total) by mouth daily.   32 tablet   11     Take 2 tablets today then one daily thereafter.   . traMADol (ULTRAM) 50 MG tablet   Oral   Take 50 mg by mouth every 6 (six) hours as needed for pain.         Marland Kitchen levofloxacin (LEVAQUIN) 500 MG tablet   Oral   Take 1 tablet (500 mg total) by mouth daily.   7 tablet   0   . mometasone (NASONEX) 50 MCG/ACT nasal spray   Nasal   Place 2 sprays into the nose daily.   17 g   12   . nitroGLYCERIN (NITROSTAT) 0.3 MG SL tablet   Sublingual   Place 1 tablet (0.3 mg total) under the tongue every 5 (five) minutes as needed.   25 tablet   6   . oxymetazoline (AFRIN NASAL SPRAY) 0.05 % nasal spray   Nasal   Place 2 sprays into the nose 2 (two) times daily.   30 mL   0   . warfarin (COUMADIN) 5 MG tablet      Use as directed by Anticoagulation Clinic   45 tablet   3    BP 120/48  Pulse 99  Temp(Src) 101.3 F (38.5 C) (Rectal)  Resp 18  SpO2 97% Physical Exam  Nursing note and vitals reviewed. Constitutional: He is oriented to person, place, and time. He appears well-developed and well-nourished. No distress.  HENT:  Head: Normocephalic and atraumatic.  Mouth/Throat: Oropharynx is clear and moist.  Eyes: EOM are normal. Pupils are equal, round, and reactive to light.  Neck: Normal range of motion. Neck supple.  No posterior cervical spine tenderness. No meningismus  Cardiovascular:  Irregularly irregular  Pulmonary/Chest: Effort normal and breath sounds normal. No respiratory distress. He has no wheezes. He has no rales. He exhibits no tenderness.  Abdominal: Soft. Bowel sounds are normal. He exhibits no distension and no mass. There is no tenderness. There is no rebound and no guarding.  Musculoskeletal: Normal range of motion. He exhibits no edema and no tenderness.  Full range of motion in all joints without any pain.   Neurological: He is alert and oriented to person, place, and time.  5/5 motor in all extremities. Sensation to intact. Patient is oriented x3. He periodically makes nonsensical statements.  Skin: Skin is warm and dry. No rash noted. No erythema.    ED Course  Procedures (including critical care time) Labs Review Labs Reviewed  CBC WITH DIFFERENTIAL - Abnormal; Notable for the following:    RBC 3.79 (*)    Hemoglobin 11.9 (*)    HCT 34.2 (*)    All other components within normal limits  COMPREHENSIVE METABOLIC PANEL - Abnormal; Notable for the following:    Glucose, Bld 172 (*)    BUN 29 (*)    Albumin 3.2 (*)  GFR calc non Af Amer 54 (*)    GFR calc Af Amer 63 (*)    All other components within normal limits  PROTIME-INR - Abnormal; Notable for the following:    Prothrombin Time 25.7 (*)    INR 2.44 (*)    All other components within normal limits  DIGOXIN LEVEL - Abnormal; Notable for the following:    Digoxin Level 0.5 (*)    All other components within normal limits  CG4 I-STAT (LACTIC ACID) - Abnormal; Notable for the following:    Lactic Acid, Venous 2.21 (*)    All other components within normal limits  CULTURE, BLOOD (ROUTINE X 2)  CULTURE, BLOOD (ROUTINE X 2)  TROPONIN I  URINALYSIS, ROUTINE W REFLEX MICROSCOPIC   Imaging Review Ct Head Wo Contrast  07/08/2013   *RADIOLOGY REPORT*  Clinical Data: Fall, altered mental status.  The  CT HEAD WITHOUT CONTRAST  Technique:  Contiguous axial images were obtained from the base of the skull through the vertex without contrast.  Comparison: None available at time of study interpretation.  Findings: The ventricles and sulci are normal for age.  No intraparenchymal hemorrhage, mass effect nor midline shift.  Patchy supratentorial white matter hypodensities are within normal range for patient's age and though non-specific suggest sequalae of chronic small vessel ischemic disease. No acute large vascular territory infarcts.  No  abnormal extra-axial fluid collections.  Basal cisterns are patent.  Severe calcific atheroma atherosclerosis of the carotid siphons, to a lesser extent vertebral arteries.  No skull fracture.  Visualized paranasal sinuses and mastoid aircells are well-aerated. Status post bilateral ocular lens implants.  Moderate left maxillary sinusitis without paranasal sinus air-fluid levels, trace ethmoid mucosal thickening. 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  IMPRESSION: No acute intracranial process.  Moderate left maxillary sinusitis .  Involutional changes, mild to moderate white matter changes suggestive chronic small vessel ischemic disease.   Original Report Authenticated By: Awilda Metro   Dg Chest Port 1 View  (if Code Sepsis Called)  07/08/2013   *RADIOLOGY REPORT*  Clinical Data: Fall, altered mental status.  PORTABLE CHEST - 1 VIEW  Comparison: Chest radiograph November 01, 2009  Findings: The cardiomediastinal silhouette is non suspicious, mild calcific atherosclerosis of the aortic knob as previously seen. The lungs are clear without pleural effusions or focal consolidations.  The pulmonary vasculature is unremarkable. Trachea projects midline and there is no pneumothorax.  The included soft tissue planes and osseous structures are unremarkable.  Multiple EKG lines overlay the patient and could obscure underlying subtle pathology.  IMPRESSION: No acute cardiopulmonary process.  Improved aeration of the lungs from November 01, 2009.   Original Report Authenticated By: Awilda Metro    MDM   1. Sinusitis   2. Altered mental status    It appears patient has a sinusitis which is responsible for his acute delirium. I asked Dr. Conley Rolls of [facility] to evaluate the patient for admission. Dr. Conley Rolls states he had an extensive talk with family and patient. Patient's confusion has improved with IV fluids. He is coherent and fully oriented.  Patient and family were offered admission but stated they would want to be discharged home with antibiotics. Return precautions have been given.    Loren Racer, MD 07/08/13 [phone]

## 2013-07-10 ENCOUNTER — Encounter: Payer: Self-pay | Admitting: Family Medicine

## 2013-07-10 ENCOUNTER — Ambulatory Visit (INDEPENDENT_AMBULATORY_CARE_PROVIDER_SITE_OTHER): Payer: Medicare Other | Admitting: Family Medicine

## 2013-07-10 VITALS — BP 98/59 | HR 72

## 2013-07-10 DIAGNOSIS — C4492 Squamous cell carcinoma of skin, unspecified: Secondary | ICD-10-CM | POA: Insufficient documentation

## 2013-07-10 DIAGNOSIS — L98429 Non-pressure chronic ulcer of back with unspecified severity: Secondary | ICD-10-CM | POA: Insufficient documentation

## 2013-07-10 DIAGNOSIS — G988 Other disorders of nervous system: Secondary | ICD-10-CM

## 2013-07-10 DIAGNOSIS — L989 Disorder of the skin and subcutaneous tissue, unspecified: Secondary | ICD-10-CM

## 2013-07-10 DIAGNOSIS — I4891 Unspecified atrial fibrillation: Secondary | ICD-10-CM

## 2013-07-10 DIAGNOSIS — E1142 Type 2 diabetes mellitus with diabetic polyneuropathy: Secondary | ICD-10-CM

## 2013-07-10 DIAGNOSIS — M25559 Pain in unspecified hip: Secondary | ICD-10-CM

## 2013-07-10 DIAGNOSIS — I10 Essential (primary) hypertension: Secondary | ICD-10-CM

## 2013-07-10 DIAGNOSIS — J329 Chronic sinusitis, unspecified: Secondary | ICD-10-CM | POA: Insufficient documentation

## 2013-07-10 DIAGNOSIS — Z7189 Other specified counseling: Secondary | ICD-10-CM | POA: Insufficient documentation

## 2013-07-10 DIAGNOSIS — E1149 Type 2 diabetes mellitus with other diabetic neurological complication: Secondary | ICD-10-CM

## 2013-07-10 DIAGNOSIS — L89109 Pressure ulcer of unspecified part of back, unspecified stage: Secondary | ICD-10-CM

## 2013-07-10 DIAGNOSIS — L8990 Pressure ulcer of unspecified site, unspecified stage: Secondary | ICD-10-CM

## 2013-07-10 DIAGNOSIS — L89159 Pressure ulcer of sacral region, unspecified stage: Secondary | ICD-10-CM

## 2013-07-10 MED ORDER — HYDROCODONE-ACETAMINOPHEN 5-325 MG PO TABS: 0.5000 | ORAL_TABLET | Freq: Four times a day (QID) | ORAL | Status: DC | PRN

## 2013-07-10 MED ORDER — INSULIN ASPART 100 UNIT/ML ~~LOC~~ SOLN: 12.0000 [IU] | Freq: Two times a day (BID) | SUBCUTANEOUS | Status: DC

## 2013-07-10 NOTE — Assessment & Plan Note (Signed)
Currently on abx per ER.

## 2013-07-10 NOTE — Assessment & Plan Note (Signed)
Stop coumadin given the fall risk.  Continue ASA.  Off BB due to low BP, continue digoxin.

## 2013-07-10 NOTE — Assessment & Plan Note (Signed)
We can add back long acting insulin depending on sugar and appetite.

## 2013-07-10 NOTE — Assessment & Plan Note (Signed)
Couldn't stage given the scabbing.  Has a cut out pillow.  Will try to off load and will have HH RN to eval and treat for consideration of dressing at home.

## 2013-07-10 NOTE — Assessment & Plan Note (Addendum)
Will notify Dr. Terrace Arabia about his current state.  Given the weakness, wrote hard rx for lift chair.  My preference would be not to use this generally, but given his instability this may prevent him from being bed bound in the near future.  Hallucinations likely multifactorial, with fever and baseline neuro condition.

## 2013-07-10 NOTE — Assessment & Plan Note (Signed)
Off BB for now, will follow.

## 2013-07-10 NOTE — Assessment & Plan Note (Signed)
Wouldn't intervene now given the coumadin use.  This could be a SCC and we'll follow this clinically.

## 2013-07-10 NOTE — Assessment & Plan Note (Signed)
Stop tramadol and use low dose of intermittent hydrocodone for now, try to limit use.

## 2013-07-10 NOTE — Progress Notes (Signed)
Recently with lower BP at home.  BB held.  In interval, more confusion and hallucinations, visual and tactile.  Was seen at ER with fever noted, didn't require admission.  Discharged to home with family support/supervision.  Progressively weak with several falls recently.  Currently still on coumadin.  Sinusitis noted on imaging at the ER, started on levaquin.  ER notes, labs, imaging reviewed.  Asking about lift chair as difficult to get up from chair.    Appetite decreased and has been off levemir with only short acting insulin at meals.  Sugar has generally been controlled w/o lows recently.   Pt is aware, oriented today and can state his wishes.  Would want his wife designated if he is incapacitated.    For AF, still on digoxin and ASA.    Still with occ R hip and L knee pain after the falls.  When his pain wasn't controlled with tylenol, he has taken occ 1/2 dose of hydrocodone.    Noted sacral ucler recently.  Also with nonacute lesion posterior to L pinna, "I've been picking at it."   PMH and SH reviewed  ROS: See HPI, otherwise noncontributory.  Meds, vitals, and allergies reviewed.   nad Alert and oriented  Speech at baseline Appears fatigued Mmm IRR ctab abd soft Ext with minimal edema Tremor at baseline Sig difficulty standing, BLE diffusely weak and tremulous.  R>L ulceration appears scabbed in the gluteal crease ~1 cm lesion posterior to L pinna appears chronically irritated.

## 2013-07-10 NOTE — Patient Instructions (Addendum)
Stop coumadin and tramadol.   Continue aspirin 81mg  a day.  Take hydrocodone sparingly for pain.  Give me an update in a few days.  Shirlee Limerick will call about your referral. I'll check with Dr. Terrace Arabia.  Take care.

## 2013-07-11 ENCOUNTER — Encounter: Payer: Self-pay | Admitting: *Deleted

## 2013-07-14 ENCOUNTER — Emergency Department (HOSPITAL_COMMUNITY): Payer: Medicare Other

## 2013-07-14 ENCOUNTER — Telehealth: Payer: Self-pay | Admitting: Family Medicine

## 2013-07-14 ENCOUNTER — Inpatient Hospital Stay (HOSPITAL_COMMUNITY)
Admission: EM | Admit: 2013-07-14 | Discharge: 2013-07-18 | DRG: 070 | Disposition: A | Discharge status: Skilled Nursing Facility | Payer: Medicare Other | Attending: Internal Medicine | Admitting: Internal Medicine

## 2013-07-14 ENCOUNTER — Encounter (HOSPITAL_COMMUNITY): Payer: Self-pay | Admitting: Emergency Medicine

## 2013-07-14 DIAGNOSIS — K2970 Gastritis, unspecified, without bleeding: Secondary | ICD-10-CM

## 2013-07-14 DIAGNOSIS — R5381 Other malaise: Secondary | ICD-10-CM

## 2013-07-14 DIAGNOSIS — Z7401 Bed confinement status: Secondary | ICD-10-CM | POA: Diagnosis present

## 2013-07-14 DIAGNOSIS — R238 Other skin changes: Secondary | ICD-10-CM

## 2013-07-14 DIAGNOSIS — J309 Allergic rhinitis, unspecified: Secondary | ICD-10-CM

## 2013-07-14 DIAGNOSIS — R42 Dizziness and giddiness: Secondary | ICD-10-CM

## 2013-07-14 DIAGNOSIS — M549 Dorsalgia, unspecified: Secondary | ICD-10-CM

## 2013-07-14 DIAGNOSIS — Z87891 Personal history of nicotine dependence: Secondary | ICD-10-CM

## 2013-07-14 DIAGNOSIS — L89151 Pressure ulcer of sacral region, stage 1: Secondary | ICD-10-CM

## 2013-07-14 DIAGNOSIS — I10 Essential (primary) hypertension: Secondary | ICD-10-CM

## 2013-07-14 DIAGNOSIS — Z87898 Personal history of other specified conditions: Secondary | ICD-10-CM

## 2013-07-14 DIAGNOSIS — E119 Type 2 diabetes mellitus without complications: Secondary | ICD-10-CM | POA: Diagnosis present

## 2013-07-14 DIAGNOSIS — Z9861 Coronary angioplasty status: Secondary | ICD-10-CM

## 2013-07-14 DIAGNOSIS — I701 Atherosclerosis of renal artery: Secondary | ICD-10-CM

## 2013-07-14 DIAGNOSIS — R627 Adult failure to thrive: Secondary | ICD-10-CM | POA: Diagnosis present

## 2013-07-14 DIAGNOSIS — Z9181 History of falling: Secondary | ICD-10-CM

## 2013-07-14 DIAGNOSIS — E876 Hypokalemia: Secondary | ICD-10-CM

## 2013-07-14 DIAGNOSIS — I739 Peripheral vascular disease, unspecified: Secondary | ICD-10-CM

## 2013-07-14 DIAGNOSIS — I4949 Other premature depolarization: Secondary | ICD-10-CM | POA: Diagnosis present

## 2013-07-14 DIAGNOSIS — J320 Chronic maxillary sinusitis: Secondary | ICD-10-CM | POA: Diagnosis present

## 2013-07-14 DIAGNOSIS — R413 Other amnesia: Secondary | ICD-10-CM

## 2013-07-14 DIAGNOSIS — K7689 Other specified diseases of liver: Secondary | ICD-10-CM

## 2013-07-14 DIAGNOSIS — K219 Gastro-esophageal reflux disease without esophagitis: Secondary | ICD-10-CM

## 2013-07-14 DIAGNOSIS — G319 Degenerative disease of nervous system, unspecified: Secondary | ICD-10-CM

## 2013-07-14 DIAGNOSIS — R609 Edema, unspecified: Secondary | ICD-10-CM

## 2013-07-14 DIAGNOSIS — I472 Ventricular tachycardia, unspecified: Secondary | ICD-10-CM

## 2013-07-14 DIAGNOSIS — Z7982 Long term (current) use of aspirin: Secondary | ICD-10-CM

## 2013-07-14 DIAGNOSIS — J329 Chronic sinusitis, unspecified: Secondary | ICD-10-CM

## 2013-07-14 DIAGNOSIS — R112 Nausea with vomiting, unspecified: Secondary | ICD-10-CM

## 2013-07-14 DIAGNOSIS — I498 Other specified cardiac arrhythmias: Secondary | ICD-10-CM

## 2013-07-14 DIAGNOSIS — Z7189 Other specified counseling: Secondary | ICD-10-CM

## 2013-07-14 DIAGNOSIS — K649 Unspecified hemorrhoids: Secondary | ICD-10-CM

## 2013-07-14 DIAGNOSIS — E43 Unspecified severe protein-calorie malnutrition: Secondary | ICD-10-CM

## 2013-07-14 DIAGNOSIS — E039 Hypothyroidism, unspecified: Secondary | ICD-10-CM

## 2013-07-14 DIAGNOSIS — E669 Obesity, unspecified: Secondary | ICD-10-CM

## 2013-07-14 DIAGNOSIS — R932 Abnormal findings on diagnostic imaging of liver and biliary tract: Secondary | ICD-10-CM

## 2013-07-14 DIAGNOSIS — L989 Disorder of the skin and subcutaneous tissue, unspecified: Secondary | ICD-10-CM

## 2013-07-14 DIAGNOSIS — K449 Diaphragmatic hernia without obstruction or gangrene: Secondary | ICD-10-CM

## 2013-07-14 DIAGNOSIS — N183 Chronic kidney disease, stage 3 unspecified: Secondary | ICD-10-CM | POA: Diagnosis present

## 2013-07-14 DIAGNOSIS — M109 Gout, unspecified: Secondary | ICD-10-CM

## 2013-07-14 DIAGNOSIS — I219 Acute myocardial infarction, unspecified: Secondary | ICD-10-CM

## 2013-07-14 DIAGNOSIS — G20A1 Parkinson's disease without dyskinesia, without mention of fluctuations: Secondary | ICD-10-CM

## 2013-07-14 DIAGNOSIS — N179 Acute kidney failure, unspecified: Secondary | ICD-10-CM | POA: Diagnosis present

## 2013-07-14 DIAGNOSIS — R0602 Shortness of breath: Secondary | ICD-10-CM

## 2013-07-14 DIAGNOSIS — R109 Unspecified abdominal pain: Secondary | ICD-10-CM

## 2013-07-14 DIAGNOSIS — Z79899 Other long term (current) drug therapy: Secondary | ICD-10-CM

## 2013-07-14 DIAGNOSIS — Z794 Long term (current) use of insulin: Secondary | ICD-10-CM

## 2013-07-14 DIAGNOSIS — I129 Hypertensive chronic kidney disease with stage 1 through stage 4 chronic kidney disease, or unspecified chronic kidney disease: Secondary | ICD-10-CM | POA: Diagnosis present

## 2013-07-14 DIAGNOSIS — I252 Old myocardial infarction: Secondary | ICD-10-CM

## 2013-07-14 DIAGNOSIS — N400 Enlarged prostate without lower urinary tract symptoms: Secondary | ICD-10-CM | POA: Diagnosis present

## 2013-07-14 DIAGNOSIS — I4891 Unspecified atrial fibrillation: Secondary | ICD-10-CM

## 2013-07-14 DIAGNOSIS — G9340 Encephalopathy, unspecified: Principal | ICD-10-CM

## 2013-07-14 DIAGNOSIS — E860 Dehydration: Secondary | ICD-10-CM

## 2013-07-14 DIAGNOSIS — E785 Hyperlipidemia, unspecified: Secondary | ICD-10-CM

## 2013-07-14 DIAGNOSIS — G20 Parkinson's disease: Secondary | ICD-10-CM

## 2013-07-14 DIAGNOSIS — Z66 Do not resuscitate: Secondary | ICD-10-CM | POA: Diagnosis present

## 2013-07-14 DIAGNOSIS — N259 Disorder resulting from impaired renal tubular function, unspecified: Secondary | ICD-10-CM

## 2013-07-14 DIAGNOSIS — N189 Chronic kidney disease, unspecified: Secondary | ICD-10-CM

## 2013-07-14 DIAGNOSIS — M25559 Pain in unspecified hip: Secondary | ICD-10-CM

## 2013-07-14 DIAGNOSIS — E1149 Type 2 diabetes mellitus with other diabetic neurological complication: Secondary | ICD-10-CM

## 2013-07-14 DIAGNOSIS — M4120 Other idiopathic scoliosis, site unspecified: Secondary | ICD-10-CM | POA: Diagnosis present

## 2013-07-14 DIAGNOSIS — I2510 Atherosclerotic heart disease of native coronary artery without angina pectoris: Secondary | ICD-10-CM

## 2013-07-14 DIAGNOSIS — D649 Anemia, unspecified: Secondary | ICD-10-CM

## 2013-07-14 DIAGNOSIS — K2990 Gastroduodenitis, unspecified, without bleeding: Secondary | ICD-10-CM

## 2013-07-14 DIAGNOSIS — I4729 Other ventricular tachycardia: Secondary | ICD-10-CM

## 2013-07-14 DIAGNOSIS — R1310 Dysphagia, unspecified: Secondary | ICD-10-CM

## 2013-07-14 DIAGNOSIS — E538 Deficiency of other specified B group vitamins: Secondary | ICD-10-CM

## 2013-07-14 LAB — CBC WITH DIFFERENTIAL/PLATELET
Basophils Relative: 0 % (ref 0–1)
Eosinophils Absolute: 0 10*3/uL (ref 0.0–0.7)
Eosinophils Relative: 0 % (ref 0–5)
Hemoglobin: 12.1 g/dL — ABNORMAL LOW (ref 13.0–17.0)
Lymphs Abs: 1.8 10*3/uL (ref 0.7–4.0)
MCH: 30.2 pg (ref 26.0–34.0)
MCHC: 33.6 g/dL (ref 30.0–36.0)
MCV: 89.8 fL (ref 78.0–100.0)
Neutrophils Relative %: 69 % (ref 43–77)
Platelets: 276 10*3/uL (ref 150–400)
RBC: 4.01 MIL/uL — ABNORMAL LOW (ref 4.22–5.81)
RDW: 13.8 % (ref 11.5–15.5)

## 2013-07-14 LAB — CULTURE, BLOOD (ROUTINE X 2)
Culture: NO GROWTH
Culture: NO GROWTH

## 2013-07-14 LAB — MAGNESIUM: Magnesium: 1.6 mg/dL (ref 1.5–2.5)

## 2013-07-14 LAB — URINALYSIS, ROUTINE W REFLEX MICROSCOPIC
Bilirubin Urine: NEGATIVE
Hgb urine dipstick: NEGATIVE
Nitrite: NEGATIVE
Protein, ur: NEGATIVE mg/dL
Urobilinogen, UA: 0.2 mg/dL (ref 0.0–1.0)

## 2013-07-14 LAB — PRO B NATRIURETIC PEPTIDE: Pro B Natriuretic peptide (BNP): 3907 pg/mL — ABNORMAL HIGH (ref 0–450)

## 2013-07-14 LAB — LIPASE, BLOOD: Lipase: 23 U/L (ref 11–59)

## 2013-07-14 LAB — PROTIME-INR: INR: 1.38 (ref 0.00–1.49)

## 2013-07-14 LAB — COMPREHENSIVE METABOLIC PANEL
ALT: 6 U/L (ref 0–53)
Albumin: 3.1 g/dL — ABNORMAL LOW (ref 3.5–5.2)
BUN: 50 mg/dL — ABNORMAL HIGH (ref 6–23)
Calcium: 9.4 mg/dL (ref 8.4–10.5)
Chloride: 100 mEq/L (ref 96–112)
GFR calc Af Amer: 39 mL/min — ABNORMAL LOW (ref >90)
Glucose, Bld: 86 mg/dL (ref 70–99)
Potassium: 3.2 mEq/L — ABNORMAL LOW (ref 3.5–5.1)
Sodium: 141 mEq/L (ref 135–145)
Total Protein: 7.3 g/dL (ref 6.0–8.3)

## 2013-07-14 LAB — DIGOXIN LEVEL: Digoxin Level: 1.1 ng/mL (ref 0.8–2.0)

## 2013-07-14 LAB — CG4 I-STAT (LACTIC ACID): Lactic Acid, Venous: 1.95 mmol/L (ref 0.5–2.2)

## 2013-07-14 LAB — CK: Total CK: 636 U/L — ABNORMAL HIGH (ref 7–232)

## 2013-07-14 LAB — GLUCOSE, CAPILLARY: Glucose-Capillary: 113 mg/dL — ABNORMAL HIGH (ref 70–99)

## 2013-07-14 LAB — TROPONIN I
Troponin I: <0.3 ng/mL (ref <0.30)
Troponin I: <0.3 ng/mL (ref <0.30)

## 2013-07-14 MED ORDER — DIGOXIN 125 MCG PO TABS
125.0000 ug | ORAL_TABLET | Freq: Every day | ORAL | Status: DC
  Administered 2013-07-16 – 2013-07-17 (×2): 125 ug via ORAL
  Filled 2013-07-14 (×3): qty 1

## 2013-07-14 MED ORDER — DOCUSATE SODIUM 100 MG PO CAPS
100.0000 mg | ORAL_CAPSULE | Freq: Every day | ORAL | Status: DC
  Administered 2013-07-16 – 2013-07-18 (×3): 100 mg via ORAL
  Filled 2013-07-14 (×4): qty 1

## 2013-07-14 MED ORDER — ONDANSETRON HCL 4 MG/2ML IJ SOLN: 4.0000 mg | Freq: Four times a day (QID) | INTRAMUSCULAR | Status: DC | PRN

## 2013-07-14 MED ORDER — DUTASTERIDE 0.5 MG PO CAPS
0.5000 mg | ORAL_CAPSULE | Freq: Every day | ORAL | Status: DC
  Administered 2013-07-16 – 2013-07-18 (×3): 0.5 mg via ORAL
  Filled 2013-07-14 (×4): qty 1

## 2013-07-14 MED ORDER — ONDANSETRON HCL 4 MG PO TABS: 4.0000 mg | ORAL_TABLET | Freq: Four times a day (QID) | ORAL | Status: DC | PRN

## 2013-07-14 MED ORDER — HEPARIN SODIUM (PORCINE) 5000 UNIT/ML IJ SOLN
5000.0000 [IU] | Freq: Three times a day (TID) | INTRAMUSCULAR | Status: DC
  Administered 2013-07-15 – 2013-07-18 (×11): 5000 [IU] via SUBCUTANEOUS
  Filled 2013-07-14 (×13): qty 1

## 2013-07-14 MED ORDER — ACETAMINOPHEN 325 MG PO TABS: 650.0000 mg | ORAL_TABLET | Freq: Four times a day (QID) | ORAL | Status: DC | PRN

## 2013-07-14 MED ORDER — SODIUM CHLORIDE 0.9 % IV SOLN
INTRAVENOUS | Status: DC
  Administered 2013-07-14: 23:00:00 via INTRAVENOUS
  Filled 2013-07-14 (×2): qty 1000

## 2013-07-14 MED ORDER — HYDROCODONE-ACETAMINOPHEN 5-325 MG PO TABS
0.5000 | ORAL_TABLET | Freq: Four times a day (QID) | ORAL | Status: DC | PRN
  Administered 2013-07-17 – 2013-07-18 (×2): 1 via ORAL
  Filled 2013-07-14 (×2): qty 1

## 2013-07-14 MED ORDER — AMOXICILLIN-POT CLAVULANATE 500-125 MG PO TABS
1.0000 | ORAL_TABLET | Freq: Two times a day (BID) | ORAL | Status: DC
  Administered 2013-07-14 – 2013-07-18 (×7): 500 mg via ORAL
  Filled 2013-07-14 (×9): qty 1

## 2013-07-14 MED ORDER — SODIUM CHLORIDE 0.9 % IV SOLN
INTRAVENOUS | Status: DC
  Administered 2013-07-14 – 2013-07-17 (×3): via INTRAVENOUS

## 2013-07-14 MED ORDER — CARBIDOPA-LEVODOPA 25-100 MG PO TABS
1.0000 | ORAL_TABLET | ORAL | Status: DC
  Administered 2013-07-15 – 2013-07-18 (×9): 1 via ORAL
  Filled 2013-07-14 (×13): qty 1

## 2013-07-14 MED ORDER — POTASSIUM CHLORIDE 10 MEQ/100ML IV SOLN
10.0000 meq | Freq: Once | INTRAVENOUS | Status: AC
  Administered 2013-07-14: 10 meq via INTRAVENOUS
  Filled 2013-07-14: qty 100

## 2013-07-14 MED ORDER — CARBIDOPA-LEVODOPA ER 50-200 MG PO TBCR
1.0000 | EXTENDED_RELEASE_TABLET | Freq: Every day | ORAL | Status: DC
  Administered 2013-07-14 – 2013-07-17 (×4): 1 via ORAL
  Filled 2013-07-14 (×5): qty 1

## 2013-07-14 MED ORDER — VITAMIN B-12 1000 MCG PO TABS
1000.0000 ug | ORAL_TABLET | Freq: Every day | ORAL | Status: DC
  Administered 2013-07-16 – 2013-07-18 (×3): 1000 ug via ORAL
  Filled 2013-07-14 (×4): qty 1

## 2013-07-14 MED ORDER — VITAMIN D3 25 MCG (1000 UNIT) PO TABS
1000.0000 [IU] | ORAL_TABLET | Freq: Every day | ORAL | Status: DC
  Administered 2013-07-16 – 2013-07-18 (×3): 1000 [IU] via ORAL
  Filled 2013-07-14 (×5): qty 1

## 2013-07-14 MED ORDER — FLUTICASONE PROPIONATE 50 MCG/ACT NA SUSP
1.0000 | Freq: Every day | NASAL | Status: DC
  Administered 2013-07-16 – 2013-07-18 (×3): 1 via NASAL
  Filled 2013-07-14: qty 16

## 2013-07-14 MED ORDER — INSULIN ASPART 100 UNIT/ML ~~LOC~~ SOLN
0.0000 [IU] | Freq: Three times a day (TID) | SUBCUTANEOUS | Status: DC
  Administered 2013-07-15 (×2): 1 [IU] via SUBCUTANEOUS
  Administered 2013-07-16: 2 [IU] via SUBCUTANEOUS
  Administered 2013-07-17: 08:00:00 1 [IU] via SUBCUTANEOUS
  Administered 2013-07-17 (×2): 2 [IU] via SUBCUTANEOUS
  Administered 2013-07-18: 1 [IU] via SUBCUTANEOUS
  Administered 2013-07-18: 13:00:00 2 [IU] via SUBCUTANEOUS

## 2013-07-14 MED ORDER — POTASSIUM CHLORIDE CRYS ER 10 MEQ PO TBCR: 10.0000 meq | EXTENDED_RELEASE_TABLET | Freq: Once | ORAL | Status: DC

## 2013-07-14 MED ORDER — ASPIRIN 81 MG PO CHEW
81.0000 mg | CHEWABLE_TABLET | Freq: Every day | ORAL | Status: DC
Start: 2013-07-15 — End: 2013-07-18
  Administered 2013-07-16 – 2013-07-18 (×3): 81 mg via ORAL
  Filled 2013-07-14 (×4): qty 1

## 2013-07-14 MED ORDER — ACETAMINOPHEN 650 MG RE SUPP: 650.0000 mg | Freq: Four times a day (QID) | RECTAL | Status: DC | PRN

## 2013-07-14 MED ORDER — ATORVASTATIN CALCIUM 80 MG PO TABS
80.0000 mg | ORAL_TABLET | Freq: Every day | ORAL | Status: DC
  Administered 2013-07-16 – 2013-07-17 (×2): 80 mg via ORAL
  Filled 2013-07-14 (×4): qty 1

## 2013-07-14 MED ORDER — DONEPEZIL HCL 10 MG PO TABS
10.0000 mg | ORAL_TABLET | Freq: Every day | ORAL | Status: DC
  Administered 2013-07-15 – 2013-07-18 (×4): 10 mg via ORAL
  Filled 2013-07-14 (×5): qty 1

## 2013-07-14 MED ORDER — SODIUM CHLORIDE 0.9 % IV BOLUS (SEPSIS)
250.0000 mL | Freq: Once | INTRAVENOUS | Status: AC
  Administered 2013-07-14: 250 mL via INTRAVENOUS

## 2013-07-14 MED ORDER — LEVOTHYROXINE SODIUM 25 MCG PO TABS
25.0000 ug | ORAL_TABLET | Freq: Every day | ORAL | Status: DC
  Administered 2013-07-15 – 2013-07-18 (×4): 25 ug via ORAL
  Filled 2013-07-14 (×5): qty 1

## 2013-07-14 MED ORDER — CILOSTAZOL 100 MG PO TABS
100.0000 mg | ORAL_TABLET | Freq: Two times a day (BID) | ORAL | Status: DC
  Administered 2013-07-14 – 2013-07-18 (×7): 100 mg via ORAL
  Filled 2013-07-14 (×9): qty 1

## 2013-07-14 MED ORDER — SODIUM CHLORIDE 0.9 % IJ SOLN
3.0000 mL | Freq: Two times a day (BID) | INTRAMUSCULAR | Status: DC
  Administered 2013-07-16 (×2): 3 mL via INTRAVENOUS

## 2013-07-14 NOTE — ED Provider Notes (Signed)
CSN: [id number]     Arrival date & time 07/14/13  1740 History   First MD Initiated Contact with Patient 07/14/13 1803     Chief Complaint  Patient presents with  . Altered Mental Status   (Consider location/radiation/quality/duration/timing/severity/associated sxs/prior Treatment) Patient is a 79 y.o. male presenting with altered mental status. The history is provided by the spouse and the patient. The history is limited by the condition of the patient.  Altered Mental Status  level 5 caveat applies to the patient's history due to his altered mental status.  As per the patient's wife the patient became increased confusion overnight to increase hallucinations. Patient has a history of stage V Parkinson's he is not able to walk and has not been able to walk for a long time. Wife noted a low-grade temperature of 99. Patient was seen on October 4 for a sinus infection treated with Levaquin for that. As stated patient is bedridden. Spouse denies any nausea vomiting diarrhea by mouth intake has been poor.  Past Medical History  Diagnosis Date  . GERD (gastroesophageal reflux disease)   . Hypothyroidism   . Anemia     Mild  . Hypertension   . Diabetes mellitus     Goal A1c ~8 due to concern for hypoglycemia  . Hyperlipidemia   . PVD (peripheral vascular disease)     Status post bilateral renal stenting, LE's  . Paroxysmal atrial fibrillation     On Coumadin and rate control meds  . CAD (coronary artery disease)     a. Inf MI 1997 tx with POBA;  b. s/p BMS to RCA for restonosis;  b. NSTEMI 9/04 => LHC 06/22/03:  LM 30%, LAD and CFX irregs, mRCA 80% ISR, EF 60% => PCI: Taxus DES to mRCA  . Hemorrhoids   . Gastritis 06/24/03 & 12/08/06    EGD Christella Hartigan)  . Hiatal hernia   . Gout   . Vitamin B 12 deficiency   . BPH (benign prostatic hyperplasia)   . Diverticular disease     via ACBE  . Herniated disc     L4, L5  . Near syncope 10/07/09  . Parkinson's disease     Per Dr. Terrace Arabia  . Chronic  kidney disease     Chronic, with a creatinine in the range of 1.2-1.6;  hx of ARF 11/2009  . Renal calculus     Recurrent lithotripsy  . Hx of echocardiogram     a. Echo 10/2009:  mild LVH, EF 55-60%, b.  Echo 11/13:  mild LVH, EF 70-75%, mid cavity dynamic obstruction wth Valsalva (pk velocity 307 cm/sec, pk gradient 38 mmHg), mild MS, mean gradient 7 mmHg   Past Surgical History  Procedure Laterality Date  . Angioplasty  10/97    Multiple   . Lithotripsy  04/29/00    Due to renal lithiasis   . Cardiac catheterization  06/22/03    and PICA for in-stent restenosis  . Iliac vein angioplasty / stenting  05/16/04    Bilateral  . Laminectomy  09/28/07    L4,L5  (Dr. Ophelia Charter)  . Cholecystectomy open  10/23/2009    Attempted lap, purulent GB (Dr. Dwain Sarna)  . Cataract extraction      right eye 2013   Family History  Problem Relation Age of Onset  . Heart disease Mother   . Cancer Father     ? Skin  . Diabetes Sister   . Heart disease Brother   . Heart disease Brother  History  Substance Use Topics  . Smoking status: Former Smoker    Quit date: 11/11/1986  . Smokeless tobacco: Never Used     Comment: Quit after MI  . Alcohol Use: No    Review of Systems  Unable to perform ROS Constitutional: Positive for fatigue.   patient's Parkinson's and some of this the altered mental status level V caveat applies the review of systems.  Allergies  Codeine sulfate  Home Medications   Current Outpatient Rx  Name  Route  Sig  Dispense  Refill  . aspirin 81 MG tablet   Oral   Take 81 mg by mouth daily.          Marland Kitchen atorvastatin (LIPITOR) 80 MG tablet      take 1 tablet by mouth every evening   30 tablet   12   . AVODART 0.5 MG capsule      take 1 capsule by mouth once daily   30 capsule   12   . carbidopa-levodopa (SINEMET CR) 50-200 MG per tablet   Oral   Take 1 tablet by mouth at bedtime.   30 tablet   6   . carbidopa-levodopa (SINEMET IR) 25-100 MG per tablet    Oral   Take 1 tablet by mouth 3 (three) times daily. 1 tab at 8am, 1 pm, 6pm   90 tablet   6   . Cholecalciferol (VITAMIN D3) 1000 UNITS capsule   Oral   Take 1,000 Units by mouth daily.           . cilostazol (PLETAL) 100 MG tablet      take 1 tablet by mouth twice a day   60 tablet   5   . cyanocobalamin 500 MCG tablet   Oral   Take 1,000 mcg by mouth daily.          . digoxin (LANOXIN) 0.125 MG tablet   Oral   Take 1 tablet (125 mcg total) by mouth daily.   30 tablet   5   . docusate sodium (COLACE) 100 MG capsule   Oral   Take 1 capsule (100 mg total) by mouth daily.         Marland Kitchen donepezil (ARICEPT) 10 MG tablet   Oral   Take 1 tablet (10 mg total) by mouth daily.   30 tablet   6   . Ferrous Sulfate (RA IRON) 27 MG TABS   Oral   Take 1 tablet by mouth daily.           . furosemide (LASIX) 40 MG tablet      Take 1 a day.  If you have more swelling, you can take a second dose if needed.   30 tablet   3   . HYDROcodone-acetaminophen (NORCO/VICODIN) 5-325 MG per tablet   Oral   Take 0.5-1 tablets by mouth every 6 (six) hours as needed for pain.   30 tablet   0   . insulin aspart (NOVOLOG) 100 UNIT/ML injection   Subcutaneous   Inject 12-15 Units into the skin 2 (two) times daily.   1 vial      . levofloxacin (LEVAQUIN) 500 MG tablet   Oral   Take 1 tablet (500 mg total) by mouth daily.   7 tablet   0   . levothyroxine (SYNTHROID, LEVOTHROID) 25 MCG tablet      TAKE 1 TABLET BY MOUTH DAILY   30 tablet   6   . mometasone (NASONEX)  50 MCG/ACT nasal spray   Nasal   Place 2 sprays into the nose daily.   17 g   12   . oxymetazoline (AFRIN NASAL SPRAY) 0.05 % nasal spray   Nasal   Place 2 sprays into the nose 2 (two) times daily.   30 mL   0   . potassium chloride SA (K-DUR,KLOR-CON) 20 MEQ tablet   Oral   Take 1 tablet (20 mEq total) by mouth daily.   32 tablet   11     Take 2 tablets today then one daily thereafter.   .  nitroGLYCERIN (NITROSTAT) 0.3 MG SL tablet   Sublingual   Place 1 tablet (0.3 mg total) under the tongue every 5 (five) minutes as needed.   25 tablet   6    BP 146/65  Pulse 125  Temp(Src) 98 F (36.7 C) (Oral)  Resp 20  SpO2 100% Physical Exam  Nursing note and vitals reviewed. Constitutional: He is oriented to person, place, and time. He appears well-developed and well-nourished. No distress.  HENT:  Head: Normocephalic and atraumatic.  Mouth/Throat: Oropharynx is clear and moist.  Mucous membranes are dry.  Eyes: Conjunctivae and EOM are normal. Pupils are equal, round, and reactive to light.  Neck: Normal range of motion. Neck supple.  Cardiovascular: Normal rate, regular rhythm and normal heart sounds.   No murmur heard. Pulmonary/Chest: Effort normal and breath sounds normal.  Abdominal: Soft. Bowel sounds are normal.  Musculoskeletal: Normal range of motion. He exhibits no edema.  Neurological: He is alert and oriented to person, place, and time. No cranial nerve deficit. He exhibits normal muscle tone. Coordination normal.  Skin: Skin is warm. No rash noted. No erythema.    ED Course  Procedures (including critical care time) Labs Review Labs Reviewed  CBC WITH DIFFERENTIAL - Abnormal; Notable for the following:    RBC 4.01 (*)    Hemoglobin 12.1 (*)    HCT 36.0 (*)    Monocytes Absolute 1.2 (*)    All other components within normal limits  COMPREHENSIVE METABOLIC PANEL - Abnormal; Notable for the following:    Potassium 3.2 (*)    BUN 50 (*)    Creatinine, Ser 1.78 (*)    Albumin 3.1 (*)    AST 50 (*)    GFR calc non Af Amer 34 (*)    GFR calc Af Amer 39 (*)    All other components within normal limits  PROTIME-INR - Abnormal; Notable for the following:    Prothrombin Time 16.6 (*)    All other components within normal limits  PRO B NATRIURETIC PEPTIDE - Abnormal; Notable for the following:    Pro B Natriuretic peptide (BNP) 3907.0 (*)    All other  components within normal limits  CULTURE, BLOOD (ROUTINE X 2)  CULTURE, BLOOD (ROUTINE X 2)  LIPASE, BLOOD  TROPONIN I  URINALYSIS, ROUTINE W REFLEX MICROSCOPIC  CG4 I-STAT (LACTIC ACID)   Results for orders placed during the hospital encounter of 07/14/13  CBC WITH DIFFERENTIAL      Result Value Range   WBC 9.6  4.0 - 10.5 K/uL   RBC 4.01 (*) 4.22 - 5.81 MIL/uL   Hemoglobin 12.1 (*) 13.0 - 17.0 g/dL   HCT 16.1 (*) 09.6 - 04.5 %   MCV 89.8  78.0 - 100.0 fL   MCH 30.2  26.0 - 34.0 pg   MCHC 33.6  30.0 - 36.0 g/dL   RDW 40.9  81.1 -  15.5 %   Platelets 276  150 - 400 K/uL   Neutrophils Relative % 69  43 - 77 %   Neutro Abs 6.7  1.7 - 7.7 K/uL   Lymphocytes Relative 18  12 - 46 %   Lymphs Abs 1.8  0.7 - 4.0 K/uL   Monocytes Relative 12  3 - 12 %   Monocytes Absolute 1.2 (*) 0.1 - 1.0 K/uL   Eosinophils Relative 0  0 - 5 %   Eosinophils Absolute 0.0  0.0 - 0.7 K/uL   Basophils Relative 0  0 - 1 %   Basophils Absolute 0.0  0.0 - 0.1 K/uL  COMPREHENSIVE METABOLIC PANEL      Result Value Range   Sodium 141  135 - 145 mEq/L   Potassium 3.2 (*) 3.5 - 5.1 mEq/L   Chloride 100  96 - 112 mEq/L   CO2 26  19 - 32 mEq/L   Glucose, Bld 86  70 - 99 mg/dL   BUN 50 (*) 6 - 23 mg/dL   Creatinine, Ser 4.09 (*) 0.50 - 1.35 mg/dL   Calcium 9.4  8.4 - 81.1 mg/dL   Total Protein 7.3  6.0 - 8.3 g/dL   Albumin 3.1 (*) 3.5 - 5.2 g/dL   AST 50 (*) 0 - 37 U/L   ALT 6  0 - 53 U/L   Alkaline Phosphatase 96  39 - 117 U/L   Total Bilirubin 0.7  0.3 - 1.2 mg/dL   GFR calc non Af Amer 34 (*) >90 mL/min   GFR calc Af Amer 39 (*) >90 mL/min  LIPASE, BLOOD      Result Value Range   Lipase 23  11 - 59 U/L  PROTIME-INR      Result Value Range   Prothrombin Time 16.6 (*) 11.6 - 15.2 seconds   INR 1.38  0.00 - 1.49  PRO B NATRIURETIC PEPTIDE      Result Value Range   Pro B Natriuretic peptide (BNP) 3907.0 (*) 0 - 450 pg/mL  TROPONIN I      Result Value Range   Troponin I <0.30  <0.30 ng/mL  CG4  I-STAT (LACTIC ACID)      Result Value Range   Lactic Acid, Venous 1.95  0.5 - 2.2 mmol/L    Imaging Review Ct Head Wo Contrast  07/14/2013   CLINICAL DATA:  Increased hallucinations.  EXAM: CT HEAD WITHOUT CONTRAST  TECHNIQUE: Contiguous axial images were obtained from the base of the skull through the vertex without intravenous contrast.  COMPARISON:  07/08/2013.  FINDINGS: Stable enlarged ventricles and subarachnoid spaces. No intracranial hemorrhage, mass lesion or CT evidence of acute infarction. Marked left maxillary sinus mucosal thickening/retention cyst formation with improvement. Decreased right maxillary sinus mucosal thickening/retention cyst with minimal residual. Unremarkable bones.  IMPRESSION: 1. No acute abnormality. 2. Stable atrophy. 3. Improving bilateral chronic maxillary sinusitis.   Electronically Signed   By: Gordan Payment M.D.   On: 07/14/2013 20:09   Dg Chest Port 1 View  07/14/2013   CLINICAL DATA:  Altered mental status. Hypertension. Ex-smoker.  EXAM: PORTABLE CHEST - 1 VIEW  COMPARISON:  07/08/2013.  FINDINGS: The heart remains normal in size and the lungs are clear. Stable mild scoliosis.  IMPRESSION: No acute abnormality.   Electronically Signed   By: Gordan Payment M.D.   On: 07/14/2013 18:42    EKG Interpretation   None      Date: 07/14/2013  Rate: 96  Rhythm:  atrial fibrillation  QRS Axis: normal  Intervals: normal  ST/T Wave abnormalities: nonspecific ST/T changes and early repolarization  Conduction Disutrbances:none  Narrative Interpretation:   Old EKG Reviewed: unchanged Today's EKG with artifact in leads 3 aVL and V2. Do not feel that this is distinctly atrial fib. There is some evidence of ventricular premature contractions. I think his tremors from his parkinsonism leading to the atrial fib statement by the computer. No significant change in EKG compared to 07/08/2013   MDM   1. Dehydration   2. Hypokalemia    The patient clinically with  dehydration by labs also consistent with dehydration and elevation of BUN and creatinine. No evidence of infection. Head CT shows no further sinusitis no evidence of acute intracranial abnormality. Suspect that the patient's worsening mental status is related to dehydration. Lactic acid done not elevated. Chest x-ray shows no evidence of pneumonia or edema or pneumothorax. Recommend continued fluid hydration admission for this overnight until patient improves. Patient also shows signs of hypokalemia we'll treat with 10 mEq of potassium IV piggyback and then reassess since the patient's creatinine is elevated.    Shelda Jakes, MD 07/14/13 2027

## 2013-07-14 NOTE — H&P (Addendum)
[facility] History and Physical  Justin Lane ZOX:[id number] DOB: 29-Dec-1929 DOA: 07/14/2013   PCP: Crawford Givens, MD   Chief Complaint: Confusion, hallucinations  HPI:  79 year old male with a history of atrial fibrillation, diabetes mellitus, hypothyroidism, hyperlipidemia, hypertension, CAD, CKD stage III presents with five-day history of increasing confusion and hallucinations. The patient's daughter and wife are at the bedside to supplement history. The patient was in emergency department on 07/08/2013 because of increasing confusion. It was thought that this may have been due to the patient's sinusitis as well as taking his Sinemet incorrectly. The patient and his family wanted to go home at that time and was discharged from the ED with a prescription for Levaquin. He has been on Levaquin 500 mg daily since 07/09/2013. Since that time, the patient has had increasing confusion and hallucinations. The patient is essentially bedbound for the past 2-3 months which was thought to be partly due to his progressive Parkinson's disease. There's been no history of vomiting, diarrhea, headache, chest discomfort, shortness of breath, coughing, abdominal pain, dysuria, hematuria, hematochezia, melena. The wife reports decreased by mouth intake for the past one to 2 days.  The patient was recently taken off of his warfarin due to frequent falls. In addition his beta blocker has recently been stopped due soft blood pressures. The patient slid off of the chair last Friday, one week prior to this admission. In the ED, the patient was found to have a serum creatinine 1.78, potassium 3.2. His CBC was unremarkable. ProBNP was 3907. Chest x-ray was clear. Lactic acid was 1.95. EKG showed A. fibrillation with a heart rate of 114. Initial troponin was negative. Assessment/Plan: Acute encephalopathy -Multifactorial including possible infectious process, dehydration, as well as his Levaquin -In out cath to  obtain UA and urine culture -Check TSH -Cycle troponins -Check digoxin level -Discontinue Levaquin Acute on chronic renal failure (CKD 3)--secondary to dehydration -Gentle IV hydration -Discontinue furosemide which he was taking at home -Check CPK as the patient is on Lipitor -baseline creatinine 1.2-1.5 Atrial fibrillation -Continue aspirin -Recently taken off of his warfarin to 2 frequent falls -Heart rate has improved with hydration into the mid to upper 90s -May need to start AV nodal agent if HR increases -Echocardiogram Bilateral hip pain -Obtain hip x-rays Maxillary sinusitis  -Discontinue Levaquin which may be exacerbating his confusion and hallucinations -Start Augmentin continue for additional days to complete 10 days of therapy Hypokalemia  -Replete  -Check magnesium  Bilateral leg edema and pain  -Venous duplex on DVT  Elevated proBNP  -May be related to the patient's increased heart rate from his dehydration  -Patient appears clinically volume depleted  -Echocardiogram  Diabetes mellitus type 2 -NovoLog sliding scale -Hemoglobin A1c 6.4 on 05/09/2013      Past Medical History  Diagnosis Date  . GERD (gastroesophageal reflux disease)   . Hypothyroidism   . Anemia     Mild  . Hypertension   . Diabetes mellitus     Goal A1c ~8 due to concern for hypoglycemia  . Hyperlipidemia   . PVD (peripheral vascular disease)     Status post bilateral renal stenting, LE's  . Paroxysmal atrial fibrillation     On Coumadin and rate control meds  . CAD (coronary artery disease)     a. Inf MI 1997 tx with POBA;  b. s/p BMS to RCA for restonosis;  b. NSTEMI 9/04 => LHC 06/22/03:  LM 30%, LAD and CFX irregs, mRCA 80% ISR, EF 60% =>  PCI: Taxus DES to [facility]  . Hemorrhoids   . Gastritis 06/24/03 & 12/08/06    EGD Christella Hartigan)  . Hiatal hernia   . Gout   . Vitamin B 12 deficiency   . BPH (benign prostatic hyperplasia)   . Diverticular disease     via ACBE  . Herniated disc      L4, L5  . Near syncope 10/07/09  . Parkinson's disease     Per Dr. Terrace Arabia  . Chronic kidney disease     Chronic, with a creatinine in the range of 1.2-1.6;  hx of ARF 11/2009  . Renal calculus     Recurrent lithotripsy  . Hx of echocardiogram     a. Echo 10/2009:  mild LVH, EF 55-60%, b.  Echo 11/13:  mild LVH, EF 70-75%, mid cavity dynamic obstruction wth Valsalva (pk velocity 307 cm/sec, pk gradient 38 mmHg), mild MS, mean gradient 7 mmHg   Past Surgical History  Procedure Laterality Date  . Angioplasty  10/97    Multiple   . Lithotripsy  04/29/00    Due to renal lithiasis   . Cardiac catheterization  06/22/03    and PICA for in-stent restenosis  . Iliac vein angioplasty / stenting  05/16/04    Bilateral  . Laminectomy  09/28/07    L4,L5  (Dr. Ophelia Charter)  . Cholecystectomy open  10/23/2009    Attempted lap, purulent GB (Dr. Dwain Sarna)  . Cataract extraction      right eye 2013   Social History:  reports that he quit smoking about 26 years ago. He has never used smokeless tobacco. He reports that he does not drink alcohol or use illicit drugs.   Family History  Problem Relation Age of Onset  . Heart disease Mother   . Cancer Father     ? Skin  . Diabetes Sister   . Heart disease Brother   . Heart disease Brother      Allergies  Allergen Reactions  . Codeine Sulfate     REACTION: nausea and vomiting      Prior to Admission medications   Medication Sig Start Date End Date Taking? Authorizing Provider  aspirin 81 MG tablet Take 81 mg by mouth daily.    Yes Historical Provider, MD  atorvastatin (LIPITOR) 80 MG tablet take 1 tablet by mouth every evening 09/29/12  Yes Joaquim Nam, MD  AVODART 0.5 MG capsule take 1 capsule by mouth once daily 11/20/12  Yes Joaquim Nam, MD  carbidopa-levodopa (SINEMET CR) 50-200 MG per tablet Take 1 tablet by mouth at bedtime. 04/18/13  Yes Nilda Riggs, NP  carbidopa-levodopa (SINEMET IR) 25-100 MG per tablet Take 1 tablet by  mouth 3 (three) times daily. 1 tab at 8am, 1 pm, 6pm 04/18/13  Yes Nilda Riggs, NP  Cholecalciferol (VITAMIN D3) 1000 UNITS capsule Take 1,000 Units by mouth daily.     Yes Historical Provider, MD  cilostazol (PLETAL) 100 MG tablet take 1 tablet by mouth twice a day 01/10/13  Yes Joaquim Nam, MD  cyanocobalamin 500 MCG tablet Take 1,000 mcg by mouth daily.    Yes Historical Provider, MD  digoxin (LANOXIN) 0.125 MG tablet Take 1 tablet (125 mcg total) by mouth daily. 06/02/13  Yes Kathleene Hazel, MD  docusate sodium (COLACE) 100 MG capsule Take 1 capsule (100 mg total) by mouth daily. 06/12/13  Yes Joaquim Nam, MD  donepezil (ARICEPT) 10 MG tablet Take 1 tablet (10 mg total)  by mouth daily. 04/18/13  Yes Nilda Riggs, NP  Ferrous Sulfate (RA IRON) 27 MG TABS Take 1 tablet by mouth daily.     Yes Historical Provider, MD  furosemide (LASIX) 40 MG tablet Take 1 a day.  If you have more swelling, you can take a second dose if needed. 05/17/13  Yes Kathleene Hazel, MD  HYDROcodone-acetaminophen (NORCO/VICODIN) 5-325 MG per tablet Take 0.5-1 tablets by mouth every 6 (six) hours as needed for pain. 07/10/13  Yes Joaquim Nam, MD  insulin aspart (NOVOLOG) 100 UNIT/ML injection Inject 12-15 Units into the skin 2 (two) times daily. 07/10/13  Yes Joaquim Nam, MD  levofloxacin (LEVAQUIN) 500 MG tablet Take 1 tablet (500 mg total) by mouth daily. 07/08/13  Yes Loren Racer, MD  levothyroxine (SYNTHROID, LEVOTHROID) 25 MCG tablet TAKE 1 TABLET BY MOUTH DAILY 05/29/13  Yes Joaquim Nam, MD  mometasone (NASONEX) 50 MCG/ACT nasal spray Place 2 sprays into the nose daily. 07/08/13  Yes Loren Racer, MD  oxymetazoline (AFRIN NASAL SPRAY) 0.05 % nasal spray Place 2 sprays into the nose 2 (two) times daily. 07/08/13  Yes Loren Racer, MD  potassium chloride SA (K-DUR,KLOR-CON) 20 MEQ tablet Take 1 tablet (20 mEq total) by mouth daily. 09/06/12  Yes Lewayne Bunting, MD   nitroGLYCERIN (NITROSTAT) 0.3 MG SL tablet Place 1 tablet (0.3 mg total) under the tongue every 5 (five) minutes as needed. 05/23/12   Kathleene Hazel, MD    Review of Systems:  Constitutional:  No weight loss, night sweats, Fevers, chills Head&Eyes: No headache.  No vision loss.   ENT:  No Difficulty swallowing,Tooth/dental problems,Sore throat,   Cardio-vascular:  No chest pain, Orthopnea, PND, swelling in lower extremities,  dizziness, palpitations  GI:  No  abdominal pain, nausea, vomiting, diarrhea, , hematochezia, melena Resp:  No shortness of breath with exertion or at rest. No cough. No coughing up of blood .No wheezing.No chest wall deformity  Skin:  no rash or lesions.  GU:  no dysuria, change in color of urine, no urgency or frequency. No flank pain.  Musculoskeletal:  No joint pain or swelling. No decreased range of motion. No back pain.  Psych:  No change in mood or affect. No depression or anxiety. Neurologic: No headache, no dysesthesia, no focal weakness, no vision loss. No syncope  Physical Exam: Filed Vitals:   07/14/13 1741 07/14/13 1815  BP: 152/55 146/65  Pulse: 125   Temp: 98 F (36.7 C)   TempSrc: Oral   Resp:  20  SpO2: 100%    General:  A&O x 2, NAD, nontoxic, pleasant/cooperative Head/Eye: No conjunctival hemorrhage, no icterus, /AT, No nystagmus ENT:  No icterus,  No thrush, no pharyngeal exudate Neck:  No masses, no lymphadenpathy, no meningismus CV:  IRRR, no rub Lung:  CTAB, good air movement, no wheeze, no rhonchi Abdomen: soft/mild RLQ tender without guarding or rebound, +BS, nondistended, no peritoneal signs Ext: No cyanosis, No rashes, No petechiae, No lymphangitis, 2+ L>R LE edema Neuro: CNII-XII intact, strength 3+/5 in bilateral upper and lower extremities, no dysmetria  Labs on Admission:  Basic Metabolic Panel:  Recent Labs Lab 07/08/13 0137 07/14/13 1810  NA 139 141  K 4.0 3.2*  CL 100 100  CO2 23 26   GLUCOSE 172* 86  BUN 29* 50*  CREATININE 1.21 1.78*  CALCIUM 9.2 9.4   Liver Function Tests:  Recent Labs Lab 07/08/13 0137 07/14/13 1810  AST 20 50*  ALT 7 6  ALKPHOS 92 96  BILITOT 0.9 0.7  PROT 6.8 7.3  ALBUMIN 3.2* 3.1*    Recent Labs Lab 07/14/13 1810  LIPASE 23   No results found for this basename: AMMONIA,  in the last 168 hours CBC:  Recent Labs Lab 07/08/13 0137 07/14/13 1810  WBC 8.6 9.6  NEUTROABS 6.0 6.7  HGB 11.9* 12.1*  HCT 34.2* 36.0*  MCV 90.2 89.8  PLT 199 276   Cardiac Enzymes:  Recent Labs Lab 07/08/13 0138 07/14/13 1810  TROPONINI <0.30 <0.30   BNP: No components found with this basename: POCBNP,  CBG:  Recent Labs Lab 07/14/13 2034  GLUCAP 79    Radiological Exams on Admission: Ct Head Wo Contrast  07/14/2013   CLINICAL DATA:  Increased hallucinations.  EXAM: CT HEAD WITHOUT CONTRAST  TECHNIQUE: Contiguous axial images were obtained from the base of the skull through the vertex without intravenous contrast.  COMPARISON:  07/08/2013.  FINDINGS: Stable enlarged ventricles and subarachnoid spaces. No intracranial hemorrhage, mass lesion or CT evidence of acute infarction. Marked left maxillary sinus mucosal thickening/retention cyst formation with improvement. Decreased right maxillary sinus mucosal thickening/retention cyst with minimal residual. Unremarkable bones.  IMPRESSION: 1. No acute abnormality. 2. Stable atrophy. 3. Improving bilateral chronic maxillary sinusitis.   Electronically Signed   By: Gordan Payment M.D.   On: 07/14/2013 20:09   Dg Chest Port 1 View  07/14/2013   CLINICAL DATA:  Altered mental status. Hypertension. Ex-smoker.  EXAM: PORTABLE CHEST - 1 VIEW  COMPARISON:  07/08/2013.  FINDINGS: The heart remains normal in size and the lungs are clear. Stable mild scoliosis.  IMPRESSION: No acute abnormality.   Electronically Signed   By: Gordan Payment M.D.   On: 07/14/2013 18:42    EKG: Independently reviewed. Atrial  fibrillation, no ST-T wave change    Time spent:60 minutes Code Status:   DNR Family Communication:   Family at bedside   Jaxie Racanelli, Vishal, DO  [facility] Pager [phone]  If 7PM-7AM, please contact night-coverage [email or web address] Password [email or web address] 07/14/2013, 9:28 PM

## 2013-07-14 NOTE — Telephone Encounter (Signed)
Patient Information:  Caller Name: Marchelle Folks  Phone: [phone]  Patient: Justin Lane, Justin Lane  Gender: Male  DOB: [birth year]  Age: 79 Years  PCP: Crawford Givens Clelia Croft) [facility])  Office Follow Up:  Does the office need to follow up with this patient?: No  Instructions For The Office: N/A  RN Note:  Temp 99.2 po at 1630.  Symptoms  Reason For Call & Symptoms: Confusion.  Worsening visual hallucinations; seeing spiders and people who are not rpesent.  On Leviquin for sinus infection diagnosed in ED 07/07/13.  Had CVA after falling out of chair per CT scan 07/07/13.  Has not urinated today; last void at 0400. Blood sugar 245 at 1530.  Reviewed Health History In EMR: Yes  Reviewed Medications In EMR: Yes  Reviewed Allergies In EMR: Yes  Reviewed Surgeries / Procedures: Yes  Date of Onset of Symptoms: 07/14/2013  Treatments Tried: > fluids, re-orienting  Treatments Tried Worked: No  Guideline(s) Used:  Confusion - Delirium  Disposition Per Guideline:   Call EMS 911 Now  Reason For Disposition Reached:   Difficult to awaken or acting confused (disoriented, slurred speech) and diabetic  Advice Given:  N/A  Patient Will Follow Care Advice:  YES

## 2013-07-14 NOTE — ED Notes (Signed)
Pt from home via ems for increasing hallucinations.  Pt hx stage 5 parkinsons.  Has hallucinations but they've been increasingly worse since yesterday.  Pt's PCP told family to have pt transported by ems to ed.  Pt was seen here last week dx sinus infection. bilat hip pain with movement, normal for pt. Family member reported to ems pt had 99 temp prior to ems arrival.

## 2013-07-14 NOTE — Telephone Encounter (Signed)
Amanda/Other Phone [phone] called regarding confusion. Declined 911.  Line was busy at time of call back.

## 2013-07-15 ENCOUNTER — Inpatient Hospital Stay (HOSPITAL_COMMUNITY): Payer: Medicare Other

## 2013-07-15 DIAGNOSIS — M79609 Pain in unspecified limb: Secondary | ICD-10-CM

## 2013-07-15 DIAGNOSIS — G20 Parkinson's disease: Secondary | ICD-10-CM | POA: Diagnosis present

## 2013-07-15 DIAGNOSIS — I4891 Unspecified atrial fibrillation: Secondary | ICD-10-CM

## 2013-07-15 DIAGNOSIS — J329 Chronic sinusitis, unspecified: Secondary | ICD-10-CM

## 2013-07-15 DIAGNOSIS — M549 Dorsalgia, unspecified: Secondary | ICD-10-CM

## 2013-07-15 LAB — BASIC METABOLIC PANEL
Calcium: 8.4 mg/dL (ref 8.4–10.5)
GFR calc Af Amer: 55 mL/min — ABNORMAL LOW (ref >90)
GFR calc non Af Amer: 48 mL/min — ABNORMAL LOW (ref >90)
Potassium: 3.8 mEq/L (ref 3.5–5.1)
Sodium: 144 mEq/L (ref 135–145)

## 2013-07-15 LAB — CBC
Hemoglobin: 10.7 g/dL — ABNORMAL LOW (ref 13.0–17.0)
MCHC: 34.3 g/dL (ref 30.0–36.0)
Platelets: 232 10*3/uL (ref 150–400)
RBC: 3.48 MIL/uL — ABNORMAL LOW (ref 4.22–5.81)

## 2013-07-15 LAB — TSH: TSH: 7.935 u[IU]/mL — ABNORMAL HIGH (ref 0.350–4.500)

## 2013-07-15 LAB — VITAMIN B12: Vitamin B-12: 1703 pg/mL — ABNORMAL HIGH (ref 211–911)

## 2013-07-15 MED ORDER — QUETIAPINE FUMARATE 25 MG PO TABS
25.0000 mg | ORAL_TABLET | Freq: Every day | ORAL | Status: DC
  Administered 2013-07-15 – 2013-07-16 (×2): 25 mg via ORAL
  Filled 2013-07-15 (×3): qty 1

## 2013-07-15 MED ORDER — ENSURE COMPLETE PO LIQD
237.0000 mL | Freq: Two times a day (BID) | ORAL | Status: DC
  Administered 2013-07-16 – 2013-07-18 (×5): 237 mL via ORAL

## 2013-07-15 MED ORDER — HEPARIN SODIUM (PORCINE) 5000 UNIT/ML IJ SOLN
5000.0000 [IU] | Freq: Three times a day (TID) | INTRAMUSCULAR | Status: DC
  Filled 2013-07-15 (×3): qty 1

## 2013-07-15 NOTE — Progress Notes (Signed)
Patient received back from xray

## 2013-07-15 NOTE — Progress Notes (Signed)
LOGON UTTECH [id number] Admitted to 4U98: 07/15/2013 5:20 AM Attending Provider: Edsel Petrin, DO    Justin Lane is a 79 y.o. male patient admitted from ED awake, alert  & orientated  X 3,  DNR, VSS - Blood pressure 156/61, pulse 111, temperature 98.4 F (36.9 C), temperature source Oral, resp. rate 20, height 6' (1.829 m), weight 76.1 kg (167 lb 12.3 oz), SpO2 94.00%., Tele # 01 placed and pt is currently running:sinus tachycardia.   IV site WDL:  hand left, condition patent and no redness with a transparent dsg that's clean dry and intact.  Allergies:   Allergies  Allergen Reactions  . Codeine Sulfate     REACTION: nausea and vomiting     Past Medical History  Diagnosis Date  . GERD (gastroesophageal reflux disease)   . Hypothyroidism   . Anemia     Mild  . Hypertension   . Diabetes mellitus     Goal A1c ~8 due to concern for hypoglycemia  . Hyperlipidemia   . PVD (peripheral vascular disease)     Status post bilateral renal stenting, LE's  . Paroxysmal atrial fibrillation     On Coumadin and rate control meds  . CAD (coronary artery disease)     a. Inf MI 1997 tx with POBA;  b. s/p BMS to RCA for restonosis;  b. NSTEMI 9/04 => LHC 06/22/03:  LM 30%, LAD and CFX irregs, mRCA 80% ISR, EF 60% => PCI: Taxus DES to mRCA  . Hemorrhoids   . Gastritis 06/24/03 & 12/08/06    EGD Christella Hartigan)  . Hiatal hernia   . Gout   . Vitamin B 12 deficiency   . BPH (benign prostatic hyperplasia)   . Diverticular disease     via ACBE  . Herniated disc     L4, L5  . Near syncope 10/07/09  . Parkinson's disease     Per Dr. Terrace Arabia  . Chronic kidney disease     Chronic, with a creatinine in the range of 1.2-1.6;  hx of ARF 11/2009  . Renal calculus     Recurrent lithotripsy  . Hx of echocardiogram     a. Echo 10/2009:  mild LVH, EF 55-60%, b.  Echo 11/13:  mild LVH, EF 70-75%, mid cavity dynamic obstruction wth Valsalva (pk velocity 307 cm/sec, pk gradient 38 mmHg), mild MS, mean gradient 7  mmHg    History:  obtained from the patient and family.  Pt orientation to unit, room and routine. Information packet given to patient/family and safety video watched.  Admission INP armband ID verified with patient/family, and in place. SR up x 2, fall risk assessment complete with Patient and family verbalizing understanding of risks associated with falls. Pt verbalizes an understanding of how to use the call bell and to call for help before getting out of bed.  Skin, clean-dry- intact without evidence of bruising, or skin tears.   No evidence of skin break down noted on exam.    Will cont to monitor and assist as needed.  Tessie Eke, RN 07/15/2013 5:20 AM

## 2013-07-15 NOTE — Progress Notes (Signed)
[facility]                                                                                Patient Demographics  Justin Lane, is a 79 y.o. male, DOB - 1930-06-10, ZOX:[id number]  Admit date - 07/14/2013   Admitting Physician Justin Hartshorn, MD  Outpatient Primary MD for the patient is Justin Givens, MD  LOS - 1   Chief Complaint  Patient presents with  . Altered Mental Status        Assessment & Plan  Acute encephalopathy with delirium -Multifactorial including possible infectious process, dehydration, as well as his Levaquin, or Parkinson Disease  -UA negative, Cardiac enzymes cycled and normal, TSH within normal limits 4.34 -Will continue to monitor, will consider neurology consult -Will add low dose seroquel    Acute on chronic renal failure (CKD 3)--secondary to dehydration  -Resolved, Cr 1.35.  Gentle IV hydration  -Lasix discontinued on admission  -Check CPK as the patient is on Lipitor  -baseline creatinine 1.2-1.5   Atrial fibrillation  -Continue aspirin.  Warfarin discontinued to fall risk.   Bilateral hip pain  -Xray: No evidence of fracture or dislocation.  Maxillary sinusitis  -Discontinue Levaquin which may be exacerbating his confusion and hallucinations  -Continue Augmentin  Hypokalemia  -Resolved, continue to monitor. -Magnesium 1.6, will replete.  Bilateral leg edema and pain  -Pending Venous duplex  Elevated proBNP  -May be related to the patient's increased heart rate from his dehydration  -Patient appears clinically volume depleted  -Echocardiogram results listed below  Diabetes mellitus type 2  -NovoLog sliding scale with accuchecks -Hemoglobin A1c 6.4 on 05/09/2013  Parkinson Disease -Continue current medication   Active Problems:   B12 DEFICIENCY   Atrial fibrillation   Acute on chronic renal failure   Dehydration   Acute encephalopathy   Parkinson disease   Code Status: DNR  Family Communication: Wife  Disposition  Plan: Admitted   Procedures  Echocardiogram Study Conclusions  - Left ventricle: The cavity size was normal. Wall thickness was normal. Systolic function was hyperdynamic. The estimated ejection fraction was in the range of 75% to 80%. There was no dynamic obstruction. Although no diagnostic regional wall motion abnormality was identified, this possibility cannot be completely excluded on the basis of this study. There was an increased relative contribution of atrial contraction to ventricular filling, which may be due to aging or hypovolemia. The study is not technically sufficient to allow evaluation of LV diastolic function. - Mitral valve: Moderately to severely calcified annulus. - Right ventricle: Systolic function was hyperdynamic. - Atrial septum: No defect or patent foramen ovale was identified.   Consults  None  DVT Prophylaxis  Heparin  Lab Results  Component Value Date   PLT 232 07/15/2013    Medications  Scheduled Meds: . amoxicillin-clavulanate  1 tablet Oral Q12H  . aspirin  81 mg Oral Daily  . atorvastatin  80 mg Oral q1800  . carbidopa-levodopa  1 tablet Oral QHS  . carbidopa-levodopa  1 tablet Oral Custom  . cholecalciferol  1,000 Units Oral Daily  . cilostazol  100 mg Oral BID  . digoxin  125 mcg Oral Daily  .  docusate sodium  100 mg Oral Daily  . donepezil  10 mg Oral Daily  . dutasteride  0.5 mg Oral Daily  . fluticasone  1 spray Each Nare Daily  . heparin  5,000 Units Subcutaneous Q8H  . insulin aspart  0-9 Units Subcutaneous TID WC  . levothyroxine  25 mcg Oral QAC breakfast  . potassium chloride  10 mEq Oral Once  . sodium chloride  3 mL Intravenous Q12H  . cyanocobalamin  1,000 mcg Oral Daily   Continuous Infusions: . sodium chloride 100 mL/hr at 07/14/13 2119  . sodium chloride 0.9 % 1,000 mL with potassium chloride 20 mEq infusion 75 mL/hr at 07/14/13 2311   PRN Meds:.acetaminophen, acetaminophen, HYDROcodone-acetaminophen,  ondansetron (ZOFRAN) IV, ondansetron  Antibiotics    Anti-infectives   Start     Dose/Rate Route Frequency Ordered Stop   07/14/13 2200  amoxicillin-clavulanate (AUGMENTIN) 500-125 MG per tablet 500 mg     1 tablet Oral Every 12 hours 07/14/13 2159         Time Spent in minutes   30 minutes   Justin Lane D.O. on 07/15/2013 at 11:33 AM  Between 7am to 7pm - Pager - [phone]  After 7pm go to [email or web address] - password [email or web address]  And look for the night coverage person covering for me after hours  [facility] Office  [phone]    Subjective:   [name] seen and examined today.  Patient had no complaints, however, was minimally verbal. Patient denies dizziness, chest pain, shortness of breath, abdominal pain, N/V/D/C, new weakness, numbess, tingling.    Objective:   Filed Vitals:   07/14/13 1815 07/14/13 2229 07/14/13 2300 07/15/13 0500  BP: 146/65 156/61  127/55  Pulse:  111  52  Temp:  98.4 F (36.9 C)  98.1 F (36.7 C)  TempSrc:  Oral  Oral  Resp: 20 20    Height:   6' (1.829 m)   Weight:   76.1 kg (167 lb 12.3 oz)   SpO2:  94%  81%    Wt Readings from Last 3 Encounters:  07/14/13 76.1 kg (167 lb 12.3 oz)  06/12/13 80.854 kg (178 lb 4 oz)  05/31/13 80.74 kg (178 lb)     Intake/Output Summary (Last 24 hours) at 07/15/13 1133 Last data filed at 07/15/13 1019  Gross per 24 hour  Intake     80 ml  Output    550 ml  Net   -470 ml    Exam  General: Well developed, well nourished, NAD, appears stated age  HEENT: NCAT, PERRLA, EOMI, Anicteic Sclera, mucous membranes moist.   Neck: Supple, no JVD, no masses  Cardiovascular: S1 S2 auscultated, no rubs, murmurs or gallops.  Irregularly irregular.  Respiratory: Clear to auscultation bilaterally with equal chest rise  Abdomen: Soft, non-tender to palpation, + bowel sounds  Extremities: warm dry without cyanosis clubbing.  +1 lower ext edema, hands placed in mittens.  Neuro: AAOx3,  cranial nerves grossly intact.   Skin: Without rashes exudates or nodules  Psych: Normal affect   Data Review   Micro Results Recent Results (from the past 240 hour(s))  CULTURE, BLOOD (ROUTINE X 2)     Status: None   Collection Time    07/08/13  1:20 AM      Result Value Range Status   Specimen Description BLOOD RIGHT ARM   Final   Special Requests BOTTLES DRAWN AEROBIC AND ANAEROBIC 5CC   Final   Culture  Setup Time  Final   Value: 07/08/2013 17:07     Performed at [facility]   Culture     Final   Value: NO GROWTH 5 DAYS     Performed at [facility]   Report Status 07/14/2013 FINAL   Final  CULTURE, BLOOD (ROUTINE X 2)     Status: None   Collection Time    07/08/13  1:20 AM      Result Value Range Status   Specimen Description BLOOD LEFT FOREARM   Final   Special Requests BOTTLES DRAWN AEROBIC ONLY 5CC   Final   Culture  Setup Time     Final   Value: 07/08/2013 17:06     Performed at [facility]   Culture     Final   Value: NO GROWTH 5 DAYS     Performed at [facility]   Report Status 07/14/2013 FINAL   Final    Radiology Reports Dg Hip Bilateral W/pelvis  07/15/2013   *RADIOLOGY REPORT*  Clinical Data: Bilateral hip pain; recent fall.  BILATERAL HIP WITH PELVIS - 4+ VIEW  Comparison: CT of the abdomen and pelvis performed 11/16/2009  Findings: There is no evidence of fracture or dislocation.  Both femoral heads are seated normally within their respective acetabula.  Degenerative change is noted along the visualized lumbar spine.  The sacroiliac joints are unremarkable in appearance.  The visualized bowel gas pattern is grossly unremarkable in appearance.  IMPRESSION: No evidence of fracture or dislocation.   Original Report Authenticated By: Tonia Ghent, M.D.   Ct Head Wo Contrast  07/14/2013   CLINICAL DATA:  Increased hallucinations.  EXAM: CT HEAD WITHOUT CONTRAST  TECHNIQUE: Contiguous axial images were obtained from the  base of the skull through the vertex without intravenous contrast.  COMPARISON:  07/08/2013.  FINDINGS: Stable enlarged ventricles and subarachnoid spaces. No intracranial hemorrhage, mass lesion or CT evidence of acute infarction. Marked left maxillary sinus mucosal thickening/retention cyst formation with improvement. Decreased right maxillary sinus mucosal thickening/retention cyst with minimal residual. Unremarkable bones.  IMPRESSION: 1. No acute abnormality. 2. Stable atrophy. 3. Improving bilateral chronic maxillary sinusitis.   Electronically Signed   By: Gordan Payment M.D.   On: 07/14/2013 20:09   Ct Head Wo Contrast  07/08/2013   *RADIOLOGY REPORT*  Clinical Data: Fall, altered mental status.  The  CT HEAD WITHOUT CONTRAST  Technique:  Contiguous axial images were obtained from the base of the skull through the vertex without contrast.  Comparison: None available at time of study interpretation.  Findings: The ventricles and sulci are normal for age.  No intraparenchymal hemorrhage, mass effect nor midline shift.  Patchy supratentorial white matter hypodensities are within normal range for patient's age and though non-specific suggest sequalae of chronic small vessel ischemic disease. No acute large vascular territory infarcts.  No abnormal extra-axial fluid collections.  Basal cisterns are patent.  Severe calcific atheroma atherosclerosis of the carotid siphons, to a lesser extent vertebral arteries.  No skull fracture.  Visualized paranasal sinuses and mastoid aircells are well-aerated. Status post bilateral ocular lens implants.  Moderate left maxillary sinusitis without paranasal sinus air-fluid levels, trace ethmoid mucosal thickening. 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  IMPRESSION: No acute intracranial process.  Moderate left maxillary sinusitis .  Involutional changes, mild to moderate white matter changes suggestive chronic small  vessel ischemic disease.   Original Report Authenticated By: Awilda Metro  Dg Chest Port 1 View  07/14/2013   CLINICAL DATA:  Altered mental status. Hypertension. Ex-smoker.  EXAM: PORTABLE CHEST - 1 VIEW  COMPARISON:  07/08/2013.  FINDINGS: The heart remains normal in size and the lungs are clear. Stable mild scoliosis.  IMPRESSION: No acute abnormality.   Electronically Signed   By: Gordan Payment M.D.   On: 07/14/2013 18:42   Dg Chest Port 1 View  (if Code Sepsis Called)  07/08/2013   *RADIOLOGY REPORT*  Clinical Data: Fall, altered mental status.  PORTABLE CHEST - 1 VIEW  Comparison: Chest radiograph November 01, 2009  Findings: The cardiomediastinal silhouette is non suspicious, mild calcific atherosclerosis of the aortic knob as previously seen. The lungs are clear without pleural effusions or focal consolidations.  The pulmonary vasculature is unremarkable. Trachea projects midline and there is no pneumothorax.  The included soft tissue planes and osseous structures are unremarkable.  Multiple EKG lines overlay the patient and could obscure underlying subtle pathology.  IMPRESSION: No acute cardiopulmonary process.  Improved aeration of the lungs from November 01, 2009.   Original Report Authenticated By: Awilda Metro    CBC  Recent Labs Lab 07/14/13 1810 07/15/13 0500  WBC 9.6 7.3  HGB 12.1* 10.7*  HCT 36.0* 31.2*  PLT 276 232  MCV 89.8 89.7  MCH 30.2 30.7  MCHC 33.6 34.3  RDW 13.8 13.8  LYMPHSABS 1.8  --   MONOABS 1.2*  --   EOSABS 0.0  --   BASOSABS 0.0  --     Chemistries   Recent Labs Lab 07/14/13 1810 07/14/13 2205 07/15/13 0500  NA 141  --  144  K 3.2*  --  3.8  CL 100  --  108  CO2 26  --  23  GLUCOSE 86  --  120*  BUN 50*  --  42*  CREATININE 1.78*  --  1.35  CALCIUM 9.4  --  8.4  MG  --  1.6  --   AST 50*  --   --   ALT 6  --   --   ALKPHOS 96  --   --   BILITOT 0.7  --   --     ------------------------------------------------------------------------------------------------------------------ estimated creatinine clearance is 46.2 ml/min (by C-G formula based on Cr of 1.35). ------------------------------------------------------------------------------------------------------------------ No results found for this basename: HGBA1C,  in the last 72 hours ------------------------------------------------------------------------------------------------------------------ No results found for this basename: CHOL, HDL, LDLCALC, TRIG, CHOLHDL, LDLDIRECT,  in the last 72 hours ------------------------------------------------------------------------------------------------------------------ No results found for this basename: TSH, T4TOTAL, FREET3, T3FREE, THYROIDAB,  in the last 72 hours ------------------------------------------------------------------------------------------------------------------ No results found for this basename: VITAMINB12, FOLATE, FERRITIN, TIBC, IRON, RETICCTPCT,  in the last 72 hours  Coagulation profile  Recent Labs Lab 07/14/13 1810  INR 1.38    No results found for this basename: DDIMER,  in the last 72 hours  Cardiac Enzymes  Recent Labs Lab 07/14/13 1810 07/14/13 2205 07/15/13 0500  TROPONINI <0.30 <0.30 <0.30   ------------------------------------------------------------------------------------------------------------------ No components found with this basename: POCBNP,

## 2013-07-15 NOTE — Progress Notes (Signed)
Echocardiogram 2D Echocardiogram has been performed.  Kippy Melena 07/15/2013, 9:43 AM

## 2013-07-15 NOTE — Progress Notes (Signed)
INITIAL NUTRITION ASSESSMENT  DOCUMENTATION CODES Per approved criteria  -Severe malnutrition in the context of chronic illness   INTERVENTION:  Ensure Complete twice daily (350 kcals, 13 gm protein per 8 fl oz bottle) RD to follow for nutrition care plan  NUTRITION DIAGNOSIS: Inadequate oral intake related to acute encephalopathy as evidenced by PO intake 0%  Goal: Pt to meet >/= 90% of their estimated nutrition needs   Monitor:  PO & supplemental intake, weight, labs, I/O's  Reason for Assessment: Malnutrition Screening Tool Report, Low Braden  79 y.o. male  Admitting Dx: confusion, hallucinations   ASSESSMENT: Patient with PMH of Parkinson's disease, DM, hypothyroidism, hyperlipidemia, hypertension, CAD, CKD stage III presented with increased confusion and hallucinations; patient has been essentially bedbound for the past 2-3 months.  Per H&P wife reported patient had a decreased appetite for a few days PTA; per weight readings, patient has had severe weight loss (10% since July 2014); visible fat loss around ribs/thoracic area; would benefit from nutrition supplements -- RD to order.  Patient meets criteria for severe malnutrition in the context of chronic illness as evidenced by < 75% intake of estimated energy requirement for > 1 month, 10% weight loss x 3 months, severe subcutaneous loss (thoracic region).  Height: Ht Readings from Last 1 Encounters:  07/14/13 6' (1.829 m)    Weight: Wt Readings from Last 1 Encounters:  07/14/13 167 lb 12.3 oz (76.1 kg)    Ideal Body Weight: 178 lb  % Ideal Body Weight: 94%  Wt Readings from Last 10 Encounters:  07/14/13 167 lb 12.3 oz (76.1 kg)  06/12/13 178 lb 4 oz (80.854 kg)  05/31/13 178 lb (80.74 kg)  05/17/13 184 lb 4 oz (83.575 kg)  05/14/13 185 lb (83.915 kg)  05/09/13 185 lb (83.915 kg)  04/18/13 186 lb (84.369 kg)  11/30/12 205 lb (92.987 kg)  08/24/12 208 lb 12.8 oz (94.711 kg)  08/02/12 203 lb (92.08 kg)     Usual Body Weight: 186 lb -- August 2014  % Usual Body Weight: 90%  BMI:  Body mass index is 22.75 kg/(m^2).  Estimated Nutritional Needs: Kcal: 1700-1900 Protein: 80-90 gm Fluid: 1.7-1.9 L  Skin: Intact  Diet Order: Carb Control  EDUCATION NEEDS: -No education needs identified at this time   Intake/Output Summary (Last 24 hours) at 07/15/13 1203 Last data filed at 07/15/13 1019  Gross per 24 hour  Intake     80 ml  Output    550 ml  Net   -470 ml    Labs:   Recent Labs Lab 07/14/13 1810 07/14/13 2205 07/15/13 0500  NA 141  --  144  K 3.2*  --  3.8  CL 100  --  108  CO2 26  --  23  BUN 50*  --  42*  CREATININE 1.78*  --  1.35  CALCIUM 9.4  --  8.4  MG  --  1.6  --   GLUCOSE 86  --  120*    CBG (last 3)   Recent Labs  07/14/13 2034 07/14/13 2242 07/15/13 0747  GLUCAP 79 113* 122*    Scheduled Meds: . amoxicillin-clavulanate  1 tablet Oral Q12H  . aspirin  81 mg Oral Daily  . atorvastatin  80 mg Oral q1800  . carbidopa-levodopa  1 tablet Oral QHS  . carbidopa-levodopa  1 tablet Oral Custom  . cholecalciferol  1,000 Units Oral Daily  . cilostazol  100 mg Oral BID  . digoxin  125 mcg Oral Daily  . docusate sodium  100 mg Oral Daily  . donepezil  10 mg Oral Daily  . dutasteride  0.5 mg Oral Daily  . fluticasone  1 spray Each Nare Daily  . heparin  5,000 Units Subcutaneous Q8H  . insulin aspart  0-9 Units Subcutaneous TID WC  . levothyroxine  25 mcg Oral QAC breakfast  . potassium chloride  10 mEq Oral Once  . sodium chloride  3 mL Intravenous Q12H  . cyanocobalamin  1,000 mcg Oral Daily    Continuous Infusions: . sodium chloride 100 mL/hr at 07/14/13 2119  . sodium chloride 0.9 % 1,000 mL with potassium chloride 20 mEq infusion 75 mL/hr at 07/14/13 2311    Past Medical History  Diagnosis Date  . GERD (gastroesophageal reflux disease)   . Hypothyroidism   . Anemia     Mild  . Hypertension   . Diabetes mellitus     Goal A1c ~8  due to concern for hypoglycemia  . Hyperlipidemia   . PVD (peripheral vascular disease)     Status post bilateral renal stenting, LE's  . Paroxysmal atrial fibrillation     On Coumadin and rate control meds  . CAD (coronary artery disease)     a. Inf MI 1997 tx with POBA;  b. s/p BMS to RCA for restonosis;  b. NSTEMI 9/04 => LHC 06/22/03:  LM 30%, LAD and CFX irregs, mRCA 80% ISR, EF 60% => PCI: Taxus DES to mRCA  . Hemorrhoids   . Gastritis 06/24/03 & 12/08/06    EGD Christella Hartigan)  . Hiatal hernia   . Gout   . Vitamin B 12 deficiency   . BPH (benign prostatic hyperplasia)   . Diverticular disease     via ACBE  . Herniated disc     L4, L5  . Near syncope 10/07/09  . Parkinson's disease     Per Dr. Terrace Arabia  . Chronic kidney disease     Chronic, with a creatinine in the range of 1.2-1.6;  hx of ARF 11/2009  . Renal calculus     Recurrent lithotripsy  . Hx of echocardiogram     a. Echo 10/2009:  mild LVH, EF 55-60%, b.  Echo 11/13:  mild LVH, EF 70-75%, mid cavity dynamic obstruction wth Valsalva (pk velocity 307 cm/sec, pk gradient 38 mmHg), mild MS, mean gradient 7 mmHg    Past Surgical History  Procedure Laterality Date  . Angioplasty  10/97    Multiple   . Lithotripsy  04/29/00    Due to renal lithiasis   . Cardiac catheterization  06/22/03    and PICA for in-stent restenosis  . Iliac vein angioplasty / stenting  05/16/04    Bilateral  . Laminectomy  09/28/07    L4,L5  (Dr. Ophelia Charter)  . Cholecystectomy open  10/23/2009    Attempted lap, purulent GB (Dr. Dwain Sarna)  . Cataract extraction      right eye 2013    Maureen Chatters, RD, LDN Pager #: [phone] After-Hours Pager #: [phone]

## 2013-07-15 NOTE — Progress Notes (Signed)
Patient will not stay awake to take pills. Patient will awaken, but within a few seconds closes his eyes and returns to sleep.  Madelin Rear, MSN, RN, [employer]

## 2013-07-15 NOTE — Progress Notes (Signed)
Family gives permission to use all 4 bed rails to prevent patient from falling

## 2013-07-15 NOTE — Evaluation (Addendum)
Physical Therapy Evaluation Patient Details Name: Justin Lane MRN: [id number] DOB: 06-Jul-1930 Today's Date: 07/15/2013 Time: [id number] PT Time Calculation (min): 23 min  PT Assessment / Plan / Recommendation History of Present Illness  79 year old male with a history of stage V Parkinson's disease, atrial fibrillation, diabetes mellitus, hypothyroidism, hyperlipidemia, hypertension, CAD, CKD stage III presents with five-day history of increasing confusion and hallucinations  Clinical Impression  Patient admitted with AMS and history of Parkinson's with major decline in function over the past 3-4 weeks.  Wife is very supportive and would like to take patient home, but needs patient to be at higher level of function to do so.  Feel patient would benefit from PT to increase mobility and level of independence.  Feel patient would benefit from CIR to reach maximum potential for hopeful return to home.  Today's session limited by patient's lethargy.    PT Assessment  Patient needs continued PT services    Follow Up Recommendations  CIR    Does the patient have the potential to tolerate intense rehabilitation      Barriers to Discharge Decreased caregiver support wife not able to care for patient at current level; will need to be more independent to return home    Equipment Recommendations  None recommended by PT    Recommendations for Other Services Rehab consult   Frequency Min 3X/week    Precautions / Restrictions Precautions Precautions: Fall Restrictions Weight Bearing Restrictions: No   Pertinent Vitals/Pain No pain indicated      Mobility  Bed Mobility Bed Mobility: Supine to Sit;Sit to Supine Supine to Sit: 2: Max assist Sit to Supine: 1: +2 Total assist Sit to Supine: Patient Percentage: 20%    Exercises     PT Diagnosis: Generalized weakness;Altered mental status  PT Problem List: Decreased activity tolerance;Decreased balance;Decreased mobility PT Treatment  Interventions: DME instruction;Gait training;Functional mobility training;Therapeutic activities;Therapeutic exercise;Balance training;Patient/family education     PT Goals(Current goals can be found in the care plan section) Acute Rehab PT Goals Patient Stated Goal: family goals:  get back to what he was doing and come home PT Goal Formulation: With family Time For Goal Achievement: 07/29/13 Potential to Achieve Goals: Fair  Visit Information  Last PT Received On: 07/15/13 Assistance Needed: +2 History of Present Illness: 79 year old male with a history of stage V Parkinson's disease, atrial fibrillation, diabetes mellitus, hypothyroidism, hyperlipidemia, hypertension, CAD, CKD stage III presents with five-day history of increasing confusion and hallucinations       Prior Functioning  Home Living Family/patient expects to be discharged to:: Private residence Living Arrangements: Spouse/significant other Available Help at Discharge: Family Type of Home: House Home Access: Ramped entrance Home Layout: One level Home Equipment: Environmental consultant - 2 wheels;Bedside commode;Shower seat;Wheelchair - manual Additional Comments: per wife and granddaughter, patient was ambulatory until about a week ago and has been dependent for transfers since that time Prior Function Level of Independence: Independent with assistive device(s) Communication Communication: No difficulties (mumbling some today)    Cognition  Cognition Arousal/Alertness: Lethargic Behavior During Therapy: WFL for tasks assessed/performed Overall Cognitive Status: Difficult to assess (appeared intact; able to recall anniversary) Difficult to assess due to: Level of arousal    Extremity/Trunk Assessment Upper Extremity Assessment Upper Extremity Assessment: Defer to OT evaluation Lower Extremity Assessment Lower Extremity Assessment: Generalized weakness   Balance Balance Balance Assessed: Yes Static Sitting Balance Static  Sitting - Balance Support: Bilateral upper extremity supported;Feet supported Static Sitting - Level of Assistance: 4: Min  assist Static Sitting - Comment/# of Minutes: 10 minutes  End of Session PT - End of Session Activity Tolerance: Patient limited by lethargy Patient left: in bed;with call bell/phone within reach;with family/visitor present  GP Functional Assessment Tool Used: clinical judgement Functional Limitation: Mobility: Walking and moving around Mobility: Walking and Moving Around Current Status (Z6109): At least 80 percent but less than 100 percent impaired, limited or restricted Mobility: Walking and Moving Around Goal Status [phone]): At least 20 percent but less than 40 percent impaired, limited or restricted   Olivia Canter, [place] [phone] 07/15/2013, 4:47 PM

## 2013-07-15 NOTE — Progress Notes (Signed)
*  Preliminary Results* Bilateral lower extremity venous duplex completed. Bilateral lower extremities are negative for deep vein thrombosis. There is no evidence of Baker's cyst bilaterally.  07/15/2013  Gertie Fey, RVT, RDCS, RDMS

## 2013-07-16 DIAGNOSIS — E876 Hypokalemia: Secondary | ICD-10-CM

## 2013-07-16 DIAGNOSIS — E785 Hyperlipidemia, unspecified: Secondary | ICD-10-CM

## 2013-07-16 DIAGNOSIS — I472 Ventricular tachycardia: Secondary | ICD-10-CM

## 2013-07-16 DIAGNOSIS — I4729 Other ventricular tachycardia: Secondary | ICD-10-CM

## 2013-07-16 DIAGNOSIS — E43 Unspecified severe protein-calorie malnutrition: Secondary | ICD-10-CM | POA: Insufficient documentation

## 2013-07-16 LAB — BASIC METABOLIC PANEL
BUN: 33 mg/dL — ABNORMAL HIGH (ref 6–23)
CO2: 19 mEq/L (ref 19–32)
Calcium: 8.8 mg/dL (ref 8.4–10.5)
Chloride: 108 mEq/L (ref 96–112)
GFR calc non Af Amer: 60 mL/min — ABNORMAL LOW (ref >90)
Glucose, Bld: 107 mg/dL — ABNORMAL HIGH (ref 70–99)
Potassium: 3.6 mEq/L (ref 3.5–5.1)
Sodium: 144 mEq/L (ref 135–145)

## 2013-07-16 LAB — GLUCOSE, CAPILLARY
Glucose-Capillary: 115 mg/dL — ABNORMAL HIGH (ref 70–99)
Glucose-Capillary: 120 mg/dL — ABNORMAL HIGH (ref 70–99)
Glucose-Capillary: 169 mg/dL — ABNORMAL HIGH (ref 70–99)
Glucose-Capillary: 161 mg/dL — ABNORMAL HIGH (ref 70–99)

## 2013-07-16 MED ORDER — POTASSIUM CHLORIDE 10 MEQ/100ML IV SOLN
10.0000 meq | INTRAVENOUS | Status: AC
  Administered 2013-07-16 (×4): 10 meq via INTRAVENOUS
  Filled 2013-07-16 (×4): qty 100

## 2013-07-16 MED ORDER — MAGNESIUM SULFATE IN D5W 10-5 MG/ML-% IV SOLN
1.0000 g | Freq: Once | INTRAVENOUS | Status: AC
  Administered 2013-07-16: 1 g via INTRAVENOUS
  Filled 2013-07-16: qty 100

## 2013-07-16 MED ORDER — MAGNESIUM SULFATE 50 % IJ SOLN
1.0000 g | Freq: Once | INTRAMUSCULAR | Status: DC
  Filled 2013-07-16: qty 2

## 2013-07-16 NOTE — Progress Notes (Signed)
Informed from CMT that pt had 5 beats vtach.  Pt appears asleep. VS BP 158/62, HR 91, Temp. 98.7, O2 96%.  Informed Dr. Catha Gosselin.  Will continue to monitor.

## 2013-07-16 NOTE — Consult Note (Signed)
NEURO HOSPITALIST CONSULT NOTE    Reason for Consult: confusion, hallucinations.  HPI:                                                                                                                                          Justin Lane is an 79 y.o. male with a a past medical history significant for HTN, DM, hyperlipidemia, PAF, CAD s/p angioplasty, MI, advanced PD, admitted to the hospital with increasing confusion and hallucinations. Family indicated that he has been having visual hallucinations on a daily basis for the past couple of months, but worsened in the last 3 days. He sees people and animals that are non threatening to him and apparently are not disturbing family life. He was seen in the ED and prescribed Levaquin for sinus infection, and apparently the worsening in confusion coincided with this new medication. His dose of levodopa remain the same for at least 6 months.  Work up in the hospital include unrevealing serologies, negative UA, and CT brain without evidence of acute abnormality. Started on low dose Seroquel last night. Denies HA, vertigo, double vision, focal weakness or numbness. Has trouble swallowing and worsening imbalance. No mood changes and short tem memory doesn't seem to be particularly impaired. According to family, his PD is progressing significantly, to the point that he is essentially confined to bed and had sustained frequent falls.       Past Medical History  Diagnosis Date  . GERD (gastroesophageal reflux disease)   . Hypothyroidism   . Anemia     Mild  . Hypertension   . Diabetes mellitus     Goal A1c ~8 due to concern for hypoglycemia  . Hyperlipidemia   . PVD (peripheral vascular disease)     Status post bilateral renal stenting, LE's  . Paroxysmal atrial fibrillation     On Coumadin and rate control meds  . CAD (coronary artery disease)     a. Inf MI 1997 tx with POBA;  b. s/p BMS to RCA for restonosis;  b. NSTEMI  9/04 => LHC 06/22/03:  LM 30%, LAD and CFX irregs, mRCA 80% ISR, EF 60% => PCI: Taxus DES to mRCA  . Hemorrhoids   . Gastritis 06/24/03 & 12/08/06    EGD Christella Hartigan)  . Hiatal hernia   . Gout   . Vitamin B 12 deficiency   . BPH (benign prostatic hyperplasia)   . Diverticular disease     via ACBE  . Herniated disc     L4, L5  . Near syncope 10/07/09  . Parkinson's disease     Per Dr. Terrace Arabia  . Chronic kidney disease     Chronic, with a creatinine in the range of 1.2-1.6;  hx of ARF 11/2009  . Renal calculus  Recurrent lithotripsy  . Hx of echocardiogram     a. Echo 10/2009:  mild LVH, EF 55-60%, b.  Echo 11/13:  mild LVH, EF 70-75%, mid cavity dynamic obstruction wth Valsalva (pk velocity 307 cm/sec, pk gradient 38 mmHg), mild MS, mean gradient 7 mmHg    Past Surgical History  Procedure Laterality Date  . Angioplasty  10/97    Multiple   . Lithotripsy  04/29/00    Due to renal lithiasis   . Cardiac catheterization  06/22/03    and PICA for in-stent restenosis  . Iliac vein angioplasty / stenting  05/16/04    Bilateral  . Laminectomy  09/28/07    L4,L5  (Dr. Ophelia Charter)  . Cholecystectomy open  10/23/2009    Attempted lap, purulent GB (Dr. Dwain Sarna)  . Cataract extraction      right eye 2013    Family History  Problem Relation Age of Onset  . Heart disease Mother   . Cancer Father     ? Skin  . Diabetes Sister   . Heart disease Brother   . Heart disease Brother     Family History: non contributory   Social History:  reports that he quit smoking about 26 years ago. He has never used smokeless tobacco. He reports that he does not drink alcohol or use illicit drugs.  Allergies  Allergen Reactions  . Codeine Sulfate     REACTION: nausea and vomiting    MEDICATIONS:                                                                                                                     I have reviewed the patient's current medications.   ROS:                                                                                                                                        History obtained from the patient, family, and chart review.  General ROS: negative for - chills, fever, night sweats, weight gain or weight loss Psychological ROS: significant for - behavioral disorder, visual hallucinations, memory difficulties. Ophthalmic ROS: negative for - blurry vision, double vision, eye pain or loss of vision ENT ROS: negative for - epistaxis, nasal discharge, oral lesions, sore throat, tinnitus or vertigo Allergy and Immunology ROS: negative for - hives or itchy/watery eyes Hematological and Lymphatic ROS: negative for - bleeding problems, bruising or swollen  lymph nodes Endocrine ROS: negative for - galactorrhea, hair pattern changes, polydipsia/polyuria or temperature intolerance Respiratory ROS: negative for - cough, hemoptysis, shortness of breath or wheezing Cardiovascular ROS: negative for - chest pain, dyspnea on exertion, edema or irregular heartbeat Gastrointestinal ROS: negative for - abdominal pain, diarrhea, hematemesis, nausea/vomiting or stool incontinence Genito-Urinary ROS: negative for - dysuria, hematuria Musculoskeletal ROS: negative for - joint swelling Neurological ROS: as noted in HPI Dermatological ROS: negative for rash and skin lesion changes  Physical exam: pleasant male in no apparent distress. Blood pressure 158/62, pulse 82, temperature 98.7 F (37.1 C), temperature source Oral, resp. rate 18, height 6' (1.829 m), weight 76.1 kg (167 lb 12.3 oz), SpO2 96.00%. Head: normocephalic. Neck: supple, no bruits, no JVD. Cardiac: no murmurs. Lungs: clear. Abdomen: soft, no tender, no mass. Extremities: no edema.   Neurologic Examination:                                                                                                      Mental Status: Alert, awake, oriented to place-year-person-situation, thought content appropriate.  Speech  fluent without evidence of aphasia.  Able to follow 3 step commands without difficulty. Cranial Nerves: II: Discs flat bilaterally; Visual fields grossly normal, pupils equal, round, reactive to light and accommodation III,IV, VI: ptosis not present, extra-ocular motions intact bilaterally V,VII: smile symmetric, facial light touch sensation normal bilaterally VIII: hearing normal bilaterally IX,X: gag reflex present XI: bilateral shoulder shrug XII: midline tongue extension Motor: Moves all extremities symmetrically T one: increased all over Sensory: Pinprick and light touch intact throughout, bilaterally Deep Tendon Reflexes:  1 all over Plantars: Right: downgoing   Left: downgoing Cerebellar: No tested Gait:  No tested. CV: pulses palpable throughout    Lab Results  Component Value Date/Time   CHOL 120 12/22/2012 11:53 AM    Results for orders placed during the hospital encounter of 07/14/13 (from the past 48 hour(s))  CBC WITH DIFFERENTIAL     Status: Abnormal   Collection Time    07/14/13  6:10 PM      Result Value Range   WBC 9.6  4.0 - 10.5 K/uL   RBC 4.01 (*) 4.22 - 5.81 MIL/uL   Hemoglobin 12.1 (*) 13.0 - 17.0 g/dL   HCT 96.2 (*) 95.2 - 84.1 %   MCV 89.8  78.0 - 100.0 fL   MCH 30.2  26.0 - 34.0 pg   MCHC 33.6  30.0 - 36.0 g/dL   RDW 32.4  40.1 - 02.7 %   Platelets 276  150 - 400 K/uL   Neutrophils Relative % 69  43 - 77 %   Neutro Abs 6.7  1.7 - 7.7 K/uL   Lymphocytes Relative 18  12 - 46 %   Lymphs Abs 1.8  0.7 - 4.0 K/uL   Monocytes Relative 12  3 - 12 %   Monocytes Absolute 1.2 (*) 0.1 - 1.0 K/uL   Eosinophils Relative 0  0 - 5 %   Eosinophils Absolute 0.0  0.0 - 0.7 K/uL   Basophils Relative 0  0 - 1 %   Basophils Absolute 0.0  0.0 - 0.1 K/uL  COMPREHENSIVE METABOLIC PANEL     Status: Abnormal   Collection Time    07/14/13  6:10 PM      Result Value Range   Sodium 141  135 - 145 mEq/L   Potassium 3.2 (*) 3.5 - 5.1 mEq/L   Chloride 100  96 - 112  mEq/L   CO2 26  19 - 32 mEq/L   Glucose, Bld 86  70 - 99 mg/dL   BUN 50 (*) 6 - 23 mg/dL   Creatinine, Ser 1.61 (*) 0.50 - 1.35 mg/dL   Calcium 9.4  8.4 - 09.6 mg/dL   Total Protein 7.3  6.0 - 8.3 g/dL   Albumin 3.1 (*) 3.5 - 5.2 g/dL   AST 50 (*) 0 - 37 U/L   ALT 6  0 - 53 U/L   Alkaline Phosphatase 96  39 - 117 U/L   Total Bilirubin 0.7  0.3 - 1.2 mg/dL   GFR calc non Af Amer 34 (*) >90 mL/min   GFR calc Af Amer 39 (*) >90 mL/min   Comment: (NOTE)     The eGFR has been calculated using the CKD EPI equation.     This calculation has not been validated in all clinical situations.     eGFR's persistently <90 mL/min signify possible Chronic Kidney     Disease.  LIPASE, BLOOD     Status: None   Collection Time    07/14/13  6:10 PM      Result Value Range   Lipase 23  11 - 59 U/L  PROTIME-INR     Status: Abnormal   Collection Time    07/14/13  6:10 PM      Result Value Range   Prothrombin Time 16.6 (*) 11.6 - 15.2 seconds   INR 1.38  0.00 - 1.49  PRO B NATRIURETIC PEPTIDE     Status: Abnormal   Collection Time    07/14/13  6:10 PM      Result Value Range   Pro B Natriuretic peptide (BNP) 3907.0 (*) 0 - 450 pg/mL  TROPONIN I     Status: None   Collection Time    07/14/13  6:10 PM      Result Value Range   Troponin I <0.30  <0.30 ng/mL   Comment:            Due to the release kinetics of cTnI,     a negative result within the first hours     of the onset of symptoms does not rule out     myocardial infarction with certainty.     If myocardial infarction is still suspected,     repeat the test at appropriate intervals.  CG4 I-STAT (LACTIC ACID)     Status: None   Collection Time    07/14/13  7:07 PM      Result Value Range   Lactic Acid, Venous 1.95  0.5 - 2.2 mmol/L  GLUCOSE, CAPILLARY     Status: None   Collection Time    07/14/13  8:34 PM      Result Value Range   Glucose-Capillary 79  70 - 99 mg/dL  URINALYSIS, ROUTINE W REFLEX MICROSCOPIC     Status: Abnormal    Collection Time    07/14/13  9:29 PM      Result Value Range   Color, Urine YELLOW  YELLOW   APPearance CLOUDY (*)  CLEAR   Specific Gravity, Urine 1.016  1.005 - 1.030   pH 5.5  5.0 - 8.0   Glucose, UA NEGATIVE  NEGATIVE mg/dL   Hgb urine dipstick NEGATIVE  NEGATIVE   Bilirubin Urine NEGATIVE  NEGATIVE   Ketones, ur NEGATIVE  NEGATIVE mg/dL   Protein, ur NEGATIVE  NEGATIVE mg/dL   Urobilinogen, UA 0.2  0.0 - 1.0 mg/dL   Nitrite NEGATIVE  NEGATIVE   Leukocytes, UA NEGATIVE  NEGATIVE   Comment: MICROSCOPIC NOT DONE ON URINES WITH NEGATIVE PROTEIN, BLOOD, LEUKOCYTES, NITRITE, OR GLUCOSE <1000 mg/dL.  DIGOXIN LEVEL     Status: None   Collection Time    07/14/13 10:05 PM      Result Value Range   Digoxin Level 1.1  0.8 - 2.0 ng/mL  TROPONIN I     Status: None   Collection Time    07/14/13 10:05 PM      Result Value Range   Troponin I <0.30  <0.30 ng/mL   Comment:            Due to the release kinetics of cTnI,     a negative result within the first hours     of the onset of symptoms does not rule out     myocardial infarction with certainty.     If myocardial infarction is still suspected,     repeat the test at appropriate intervals.  TSH     Status: Abnormal   Collection Time    07/14/13 10:05 PM      Result Value Range   TSH 7.935 (*) 0.350 - 4.500 uIU/mL   Comment: Performed at [facility]  MAGNESIUM     Status: None   Collection Time    07/14/13 10:05 PM      Result Value Range   Magnesium 1.6  1.5 - 2.5 mg/dL  CK     Status: Abnormal   Collection Time    07/14/13 10:05 PM      Result Value Range   Total CK 636 (*) 7 - 232 U/L  VITAMIN B12     Status: Abnormal   Collection Time    07/14/13 10:05 PM      Result Value Range   Vitamin B-12 1703 (*) 211 - 911 pg/mL   Comment: Performed at [facility]  GLUCOSE, CAPILLARY     Status: Abnormal   Collection Time    07/14/13 10:42 PM      Result Value Range   Glucose-Capillary 113 (*) 70 - 99 mg/dL   BASIC METABOLIC PANEL     Status: Abnormal   Collection Time    07/15/13  5:00 AM      Result Value Range   Sodium 144  135 - 145 mEq/L   Potassium 3.8  3.5 - 5.1 mEq/L   Chloride 108  96 - 112 mEq/L   CO2 23  19 - 32 mEq/L   Glucose, Bld 120 (*) 70 - 99 mg/dL   BUN 42 (*) 6 - 23 mg/dL   Creatinine, Ser 4.13  0.50 - 1.35 mg/dL   Calcium 8.4  8.4 - 24.4 mg/dL   GFR calc non Af Amer 48 (*) >90 mL/min   GFR calc Af Amer 55 (*) >90 mL/min   Comment: (NOTE)     The eGFR has been calculated using the CKD EPI equation.     This calculation has not been validated in all clinical situations.  eGFR's persistently <90 mL/min signify possible Chronic Kidney     Disease.  CBC     Status: Abnormal   Collection Time    07/15/13  5:00 AM      Result Value Range   WBC 7.3  4.0 - 10.5 K/uL   RBC 3.48 (*) 4.22 - 5.81 MIL/uL   Hemoglobin 10.7 (*) 13.0 - 17.0 g/dL   HCT 16.1 (*) 09.6 - 04.5 %   MCV 89.7  78.0 - 100.0 fL   MCH 30.7  26.0 - 34.0 pg   MCHC 34.3  30.0 - 36.0 g/dL   RDW 40.9  81.1 - 91.4 %   Platelets 232  150 - 400 K/uL  TROPONIN I     Status: None   Collection Time    07/15/13  5:00 AM      Result Value Range   Troponin I <0.30  <0.30 ng/mL   Comment:            Due to the release kinetics of cTnI,     a negative result within the first hours     of the onset of symptoms does not rule out     myocardial infarction with certainty.     If myocardial infarction is still suspected,     repeat the test at appropriate intervals.  GLUCOSE, CAPILLARY     Status: Abnormal   Collection Time    07/15/13  7:47 AM      Result Value Range   Glucose-Capillary 122 (*) 70 - 99 mg/dL  GLUCOSE, CAPILLARY     Status: Abnormal   Collection Time    07/15/13 12:00 PM      Result Value Range   Glucose-Capillary 150 (*) 70 - 99 mg/dL  GLUCOSE, CAPILLARY     Status: Abnormal   Collection Time    07/15/13  4:53 PM      Result Value Range   Glucose-Capillary 131 (*) 70 - 99 mg/dL   GLUCOSE, CAPILLARY     Status: Abnormal   Collection Time    07/16/13 12:05 AM      Result Value Range   Glucose-Capillary 101 (*) 70 - 99 mg/dL   Comment 1 Notify RN    BASIC METABOLIC PANEL     Status: Abnormal   Collection Time    07/16/13  5:00 AM      Result Value Range   Sodium 144  135 - 145 mEq/L   Potassium 3.6  3.5 - 5.1 mEq/L   Chloride 108  96 - 112 mEq/L   CO2 19  19 - 32 mEq/L   Glucose, Bld 107 (*) 70 - 99 mg/dL   BUN 33 (*) 6 - 23 mg/dL   Creatinine, Ser 7.82  0.50 - 1.35 mg/dL   Calcium 8.8  8.4 - 95.6 mg/dL   GFR calc non Af Amer 60 (*) >90 mL/min   GFR calc Af Amer 69 (*) >90 mL/min   Comment: (NOTE)     The eGFR has been calculated using the CKD EPI equation.     This calculation has not been validated in all clinical situations.     eGFR's persistently <90 mL/min signify possible Chronic Kidney     Disease.  GLUCOSE, CAPILLARY     Status: Abnormal   Collection Time    07/16/13  8:02 AM      Result Value Range   Glucose-Capillary 115 (*) 70 - 99 mg/dL  GLUCOSE, CAPILLARY  Status: Abnormal   Collection Time    07/16/13 11:45 AM      Result Value Range   Glucose-Capillary 120 (*) 70 - 99 mg/dL    Dg Hip Bilateral W/pelvis  07/15/2013   *RADIOLOGY REPORT*  Clinical Data: Bilateral hip pain; recent fall.  BILATERAL HIP WITH PELVIS - 4+ VIEW  Comparison: CT of the abdomen and pelvis performed 11/16/2009  Findings: There is no evidence of fracture or dislocation.  Both femoral heads are seated normally within their respective acetabula.  Degenerative change is noted along the visualized lumbar spine.  The sacroiliac joints are unremarkable in appearance.  The visualized bowel gas pattern is grossly unremarkable in appearance.  IMPRESSION: No evidence of fracture or dislocation.   Original Report Authenticated By: Tonia Ghent, M.D.   Ct Head Wo Contrast  07/14/2013   CLINICAL DATA:  Increased hallucinations.  EXAM: CT HEAD WITHOUT CONTRAST  TECHNIQUE:  Contiguous axial images were obtained from the base of the skull through the vertex without intravenous contrast.  COMPARISON:  07/08/2013.  FINDINGS: Stable enlarged ventricles and subarachnoid spaces. No intracranial hemorrhage, mass lesion or CT evidence of acute infarction. Marked left maxillary sinus mucosal thickening/retention cyst formation with improvement. Decreased right maxillary sinus mucosal thickening/retention cyst with minimal residual. Unremarkable bones.  IMPRESSION: 1. No acute abnormality. 2. Stable atrophy. 3. Improving bilateral chronic maxillary sinusitis.   Electronically Signed   By: Gordan Payment M.D.   On: 07/14/2013 20:09   Dg Chest Port 1 View  07/14/2013   CLINICAL DATA:  Altered mental status. Hypertension. Ex-smoker.  EXAM: PORTABLE CHEST - 1 VIEW  COMPARISON:  07/08/2013.  FINDINGS: The heart remains normal in size and the lungs are clear. Stable mild scoliosis.  IMPRESSION: No acute abnormality.   Electronically Signed   By: Gordan Payment M.D.   On: 07/14/2013 18:42     Assessment/Plan: 79 y/o with advanced PD and new onset non threatening visual hallucinations. Work up in the hospital reveals no evidence of infection, metabolic derangements. CT brain without acute brain abnormality.  His dose of levodopa hasn't been modify for at least 6 months and no recent introduction of dopamine agonist, thus it is extremely that his hallucinations are related to levodopa.  I concur that the reason for his visual hallucinations is most likely a multifactorial, but strongly believe progression of his underlying PD is the main reason for his confusion and visual hallucinations. A short trial of Seroquel seems to be appropriate, but certainly the management of visual hallucinations in advanced PD can be a major challenge and I explained this to the family at length. Can increase Seroquel to 50 mg daily with subsequent dose adjustments as outpatient.    Wyatt Portela ,MD [facility] [phone]  07/16/2013, 12:14 PM

## 2013-07-16 NOTE — Progress Notes (Signed)
Utilization Review completed.  

## 2013-07-16 NOTE — Progress Notes (Signed)
Rehab Admissions Coordinator Note:  Patient was screened by Clois Dupes for appropriateness for an Inpatient Acute Rehab Consult.  At this time, we would like to assess how pt does in subsequent therapy sessions before determining rehab venue options. Noted P.T. Evaluation yesterday.I will follow.  Clois Dupes 07/16/2013, 9:31 AM  I can be reached at [phone].

## 2013-07-16 NOTE — Progress Notes (Signed)
Patient slept most of night. Patient took PM medications for me without any problems. Less fidgeting with mitts and IV tubing. Foam dressing placed on sacrum. 2 stage 2 pressure ulcers on each side of inside buttocks.

## 2013-07-16 NOTE — Progress Notes (Signed)
[facility]                                                                                Patient Demographics  Justin Lane, is a 79 y.o. male, DOB - [birth year], RUE:[id number]  Admit date - 07/14/2013   Admitting Physician Catarina Hartshorn, MD  Outpatient Primary MD for the patient is Crawford Givens, MD  LOS - 2   Chief Complaint  Patient presents with  . Altered Mental Status        Assessment & Plan  Acute encephalopathy with delirium -Multifactorial including possible infectious process, dehydration, as well as his Levaquin, or Parkinson Disease  -UA negative, Cardiac enzymes cycled and normal, TSH within normal limits 4.34 -Will continue to monitor, Neurology consulted   Acute on chronic renal failure (CKD 3)--secondary to dehydration  -Resolved  Atrial fibrillation  -Continue aspirin.  Warfarin discontinued to fall risk.  Patient had questionable 5 beat run of Vtach.  Will consult Cardiology.  Bilateral hip pain  -Xray: No evidence of fracture or dislocation.  Maxillary sinusitis  -Discontinue Levaquin which may be exacerbating his confusion and hallucinations  -Continue Augmentin  Hypokalemia  -Resolved, continue to monitor. -Magnesium 1.6, will replete and recheck.  Bilateral leg edema and pain  -Pending Venous duplex  Elevated proBNP  -May be related to the patient's increased heart rate from his dehydration  -Patient appears clinically volume depleted  -Echocardiogram results listed below  Diabetes mellitus type 2  -NovoLog sliding scale with accuchecks -Hemoglobin A1c 6.4 on 05/09/2013  Parkinson Disease -Continue current medication, neurology consulted.   Active Problems:   B12 DEFICIENCY   Atrial fibrillation   Acute on chronic renal failure   Dehydration   Acute encephalopathy   Parkinson disease   Protein-calorie malnutrition, severe   Code Status: DNR  Family Communication: Family at bedside.  Disposition Plan:  Admitted   Procedures  Echocardiogram Study Conclusions  - Left ventricle: The cavity size was normal. Wall thickness was normal. Systolic function was hyperdynamic. The estimated ejection fraction was in the range of 75% to 80%. There was no dynamic obstruction. Although no diagnostic regional wall motion abnormality was identified, this possibility cannot be completely excluded on the basis of this study. There was an increased relative contribution of atrial contraction to ventricular filling, which may be due to aging or hypovolemia. The study is not technically sufficient to allow evaluation of LV diastolic function. - Mitral valve: Moderately to severely calcified annulus. - Right ventricle: Systolic function was hyperdynamic. - Atrial septum: No defect or patent foramen ovale was identified.   Consults  None  DVT Prophylaxis  Heparin  Lab Results  Component Value Date   PLT 232 07/15/2013    Medications  Scheduled Meds: . amoxicillin-clavulanate  1 tablet Oral Q12H  . aspirin  81 mg Oral Daily  . atorvastatin  80 mg Oral q1800  . carbidopa-levodopa  1 tablet Oral QHS  . carbidopa-levodopa  1 tablet Oral Custom  . cholecalciferol  1,000 Units Oral Daily  . cilostazol  100 mg Oral BID  . digoxin  125 mcg Oral Daily  . docusate sodium  100 mg Oral Daily  . donepezil  10 mg Oral Daily  . dutasteride  0.5 mg Oral Daily  . feeding supplement (ENSURE COMPLETE)  237 mL Oral BID BM  . fluticasone  1 spray Each Nare Daily  . heparin  5,000 Units Subcutaneous Q8H  . insulin aspart  0-9 Units Subcutaneous TID WC  . levothyroxine  25 mcg Oral QAC breakfast  . potassium chloride  10 mEq Oral Once  . QUEtiapine  25 mg Oral QHS  . sodium chloride  3 mL Intravenous Q12H  . cyanocobalamin  1,000 mcg Oral Daily   Continuous Infusions: . sodium chloride 50 mL/hr at 07/15/13 1451   PRN Meds:.acetaminophen, acetaminophen, HYDROcodone-acetaminophen, ondansetron (ZOFRAN)  IV, ondansetron  Antibiotics    Anti-infectives   Start     Dose/Rate Route Frequency Ordered Stop   07/14/13 2200  amoxicillin-clavulanate (AUGMENTIN) 500-125 MG per tablet 500 mg     1 tablet Oral Every 12 hours 07/14/13 2159         Time Spent in minutes   30 minutes   Leiyah Maultsby D.O. on 07/16/2013 at 10:23 AM  Between 7am to 7pm - Pager - [phone]  After 7pm go to [email or web address] - password [email or web address]  And look for the night coverage person covering for me after hours  [facility] Office  [phone]    Subjective:   Justin Lane seen and examined today.  Patient had no complaints, however, was minimally verbal. Patient denies dizziness, chest pain, shortness of breath, abdominal pain, N/V/D/C, new weakness, numbess, tingling.    Objective:   Filed Vitals:   07/15/13 1406 07/15/13 2114 07/16/13 0544 07/16/13 0940  BP: 147/41 127/56 141/58 158/62  Pulse: 103 89 82 82  Temp: 97.7 F (36.5 C) 98.4 F (36.9 C) 98.9 F (37.2 C) 98.7 F (37.1 C)  TempSrc:  Axillary Axillary Oral  Resp: 18 18 18 18   Height:      Weight:      SpO2: 95% 92% 97% 96%    Wt Readings from Last 3 Encounters:  07/14/13 76.1 kg (167 lb 12.3 oz)  06/12/13 80.854 kg (178 lb 4 oz)  05/31/13 80.74 kg (178 lb)     Intake/Output Summary (Last 24 hours) at 07/16/13 1023 Last data filed at 07/16/13 0531  Gross per 24 hour  Intake      0 ml  Output    700 ml  Net   -700 ml    Exam  General: Well developed, well nourished, NAD, appears stated age  HEENT: NCAT, PERRLA, EOMI, Anicteic Sclera, mucous membranes moist.   Neck: Supple, no JVD, no masses  Cardiovascular: S1 S2 auscultated, no rubs, murmurs or gallops.  Irregularly irregular.  Respiratory: Clear to auscultation bilaterally with equal chest rise  Abdomen: Soft, non-tender to palpation, + bowel sounds  Extremities: warm dry without cyanosis clubbing.  +1 lower ext edema, hands placed in mittens.  Neuro:  AAOx3, cranial nerves grossly intact.   Skin: Without rashes exudates or nodules  Psych: Normal affect   Data Review   Micro Results Recent Results (from the past 240 hour(s))  CULTURE, BLOOD (ROUTINE X 2)     Status: None   Collection Time    07/08/13  1:20 AM      Result Value Range Status   Specimen Description BLOOD RIGHT ARM   Final   Special Requests BOTTLES DRAWN AEROBIC AND ANAEROBIC 5CC   Final   Culture  Setup Time     Final   Value: 07/08/2013 17:07  Performed at [facility]     Final   Value: NO GROWTH 5 DAYS     Performed at [facility]   Report Status 07/14/2013 FINAL   Final  CULTURE, BLOOD (ROUTINE X 2)     Status: None   Collection Time    07/08/13  1:20 AM      Result Value Range Status   Specimen Description BLOOD LEFT FOREARM   Final   Special Requests BOTTLES DRAWN AEROBIC ONLY 5CC   Final   Culture  Setup Time     Final   Value: 07/08/2013 17:06     Performed at [facility]   Culture     Final   Value: NO GROWTH 5 DAYS     Performed at [facility]   Report Status 07/14/2013 FINAL   Final    Radiology Reports Dg Hip Bilateral W/pelvis  07/15/2013   *RADIOLOGY REPORT*  Clinical Data: Bilateral hip pain; recent fall.  BILATERAL HIP WITH PELVIS - 4+ VIEW  Comparison: CT of the abdomen and pelvis performed 11/16/2009  Findings: There is no evidence of fracture or dislocation.  Both femoral heads are seated normally within their respective acetabula.  Degenerative change is noted along the visualized lumbar spine.  The sacroiliac joints are unremarkable in appearance.  The visualized bowel gas pattern is grossly unremarkable in appearance.  IMPRESSION: No evidence of fracture or dislocation.   Original Report Authenticated By: Tonia Ghent, M.D.   Ct Head Wo Contrast  07/14/2013   CLINICAL DATA:  Increased hallucinations.  EXAM: CT HEAD WITHOUT CONTRAST  TECHNIQUE: Contiguous axial images were obtained from  the base of the skull through the vertex without intravenous contrast.  COMPARISON:  07/08/2013.  FINDINGS: Stable enlarged ventricles and subarachnoid spaces. No intracranial hemorrhage, mass lesion or CT evidence of acute infarction. Marked left maxillary sinus mucosal thickening/retention cyst formation with improvement. Decreased right maxillary sinus mucosal thickening/retention cyst with minimal residual. Unremarkable bones.  IMPRESSION: 1. No acute abnormality. 2. Stable atrophy. 3. Improving bilateral chronic maxillary sinusitis.   Electronically Signed   By: Gordan Payment M.D.   On: 07/14/2013 20:09   Ct Head Wo Contrast  07/08/2013   *RADIOLOGY REPORT*  Clinical Data: Fall, altered mental status.  The  CT HEAD WITHOUT CONTRAST  Technique:  Contiguous axial images were obtained from the base of the skull through the vertex without contrast.  Comparison: None available at time of study interpretation.  Findings: The ventricles and sulci are normal for age.  No intraparenchymal hemorrhage, mass effect nor midline shift.  Patchy supratentorial white matter hypodensities are within normal range for patient's age and though non-specific suggest sequalae of chronic small vessel ischemic disease. No acute large vascular territory infarcts.  No abnormal extra-axial fluid collections.  Basal cisterns are patent.  Severe calcific atheroma atherosclerosis of the carotid siphons, to a lesser extent vertebral arteries.  No skull fracture.  Visualized paranasal sinuses and mastoid aircells are well-aerated. Status post bilateral ocular lens implants.  Moderate left maxillary sinusitis without paranasal sinus air-fluid levels, trace ethmoid mucosal thickening. 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  IMPRESSION: No acute intracranial process.  Moderate left maxillary sinusitis .  Involutional changes, mild to moderate white matter changes suggestive chronic  small vessel ischemic disease.   Original Report Authenticated By: Awilda Metro   Dg Chest Port 1 View  07/14/2013   CLINICAL  DATA:  Altered mental status. Hypertension. Ex-smoker.  EXAM: PORTABLE CHEST - 1 VIEW  COMPARISON:  07/08/2013.  FINDINGS: The heart remains normal in size and the lungs are clear. Stable mild scoliosis.  IMPRESSION: No acute abnormality.   Electronically Signed   By: Gordan Payment M.D.   On: 07/14/2013 18:42   Dg Chest Port 1 View  (if Code Sepsis Called)  07/08/2013   *RADIOLOGY REPORT*  Clinical Data: Fall, altered mental status.  PORTABLE CHEST - 1 VIEW  Comparison: Chest radiograph November 01, 2009  Findings: The cardiomediastinal silhouette is non suspicious, mild calcific atherosclerosis of the aortic knob as previously seen. The lungs are clear without pleural effusions or focal consolidations.  The pulmonary vasculature is unremarkable. Trachea projects midline and there is no pneumothorax.  The included soft tissue planes and osseous structures are unremarkable.  Multiple EKG lines overlay the patient and could obscure underlying subtle pathology.  IMPRESSION: No acute cardiopulmonary process.  Improved aeration of the lungs from November 01, 2009.   Original Report Authenticated By: Awilda Metro    CBC  Recent Labs Lab 07/14/13 1810 07/15/13 0500  WBC 9.6 7.3  HGB 12.1* 10.7*  HCT 36.0* 31.2*  PLT 276 232  MCV 89.8 89.7  MCH 30.2 30.7  MCHC 33.6 34.3  RDW 13.8 13.8  LYMPHSABS 1.8  --   MONOABS 1.2*  --   EOSABS 0.0  --   BASOSABS 0.0  --     Chemistries   Recent Labs Lab 07/14/13 1810 07/14/13 2205 07/15/13 0500 07/16/13 0500  NA 141  --  144 144  K 3.2*  --  3.8 3.6  CL 100  --  108 108  CO2 26  --  23 19  GLUCOSE 86  --  120* 107*  BUN 50*  --  42* 33*  CREATININE 1.78*  --  1.35 1.12  CALCIUM 9.4  --  8.4 8.8  MG  --  1.6  --   --   AST 50*  --   --   --   ALT 6  --   --   --   ALKPHOS 96  --   --   --   BILITOT 0.7  --   --    --    ------------------------------------------------------------------------------------------------------------------ estimated creatinine clearance is 55.7 ml/min (by C-G formula based on Cr of 1.12). ------------------------------------------------------------------------------------------------------------------ No results found for this basename: HGBA1C,  in the last 72 hours ------------------------------------------------------------------------------------------------------------------ No results found for this basename: CHOL, HDL, LDLCALC, TRIG, CHOLHDL, LDLDIRECT,  in the last 72 hours ------------------------------------------------------------------------------------------------------------------  Recent Labs  07/14/13 2205  TSH 7.935*   ------------------------------------------------------------------------------------------------------------------  Recent Labs  07/14/13 2205  VITAMINB12 1703*    Coagulation profile  Recent Labs Lab 07/14/13 1810  INR 1.38    No results found for this basename: DDIMER,  in the last 72 hours  Cardiac Enzymes  Recent Labs Lab 07/14/13 1810 07/14/13 2205 07/15/13 0500  TROPONINI <0.30 <0.30 <0.30   ------------------------------------------------------------------------------------------------------------------ No components found with this basename: POCBNP,

## 2013-07-16 NOTE — Consult Note (Signed)
Primary cardiologist: Consulting cardiologist:Jacquetta Polhamus MD Admit date 07/14/13 Reason for consultation: Non-sustained VT Clinical Summary Justin Lane is a 79 y.o.male history of parkinsons disease, afib, DM, hypothyroid, HL, HTN, CAD, and CKD stage 3 admitted with 5 day history of altered mental status that has been ongoing. He was noted to have a 5 beat run of NSVT this morning, and cardiology was consulted. His AMS was initially thought secondary to sinus infection, started on abx treatment without improvement, symptoms have actually progressed. He is bedbound x 2-3 months due to worsening Parkinsons disease.   From a cardiac standpoint, he has a history of afib but is off coumadin now due to falls. He previously was on beta blocker, however this too has been stopped due to low normal blood pressures. He has a history of CAD with prior RCA stenting in 1998, repeat stenting to RCA in 2004. He denies any chest pain, palpitations, SOB, or DOE. Tele shows isolated episode of NSVT x 5 beats this morning, patient denies symptoms. Review of other strips show some occasional PVCs, no evidence of sustained arrhythmia. Troponins negative x 3, EKG shows afib rate 114, isolated PVC, no ischemic changes. Echo shows hyperdynamic LVEF 65-70%.   Allergies  Allergen Reactions  . Codeine Sulfate     REACTION: nausea and vomiting    Medications Scheduled Medications: . amoxicillin-clavulanate  1 tablet Oral Q12H  . aspirin  81 mg Oral Daily  . atorvastatin  80 mg Oral q1800  . carbidopa-levodopa  1 tablet Oral QHS  . carbidopa-levodopa  1 tablet Oral Custom  . cholecalciferol  1,000 Units Oral Daily  . cilostazol  100 mg Oral BID  . digoxin  125 mcg Oral Daily  . docusate sodium  100 mg Oral Daily  . donepezil  10 mg Oral Daily  . dutasteride  0.5 mg Oral Daily  . feeding supplement (ENSURE COMPLETE)  237 mL Oral BID BM  . fluticasone  1 spray Each Nare Daily  . heparin  5,000 Units  Subcutaneous Q8H  . insulin aspart  0-9 Units Subcutaneous TID WC  . levothyroxine  25 mcg Oral QAC breakfast  . magnesium sulfate  1 g Intravenous Once  . potassium chloride  10 mEq Intravenous Q1 Hr x 4  . potassium chloride  10 mEq Oral Once  . QUEtiapine  25 mg Oral QHS  . sodium chloride  3 mL Intravenous Q12H  . cyanocobalamin  1,000 mcg Oral Daily     Infusions: . sodium chloride 50 mL/hr at 07/15/13 1451     PRN Medications:  acetaminophen, acetaminophen, HYDROcodone-acetaminophen, ondansetron (ZOFRAN) IV, ondansetron   Past Medical History  Diagnosis Date  . GERD (gastroesophageal reflux disease)   . Hypothyroidism   . Anemia     Mild  . Hypertension   . Diabetes mellitus     Goal A1c ~8 due to concern for hypoglycemia  . Hyperlipidemia   . PVD (peripheral vascular disease)     Status post bilateral renal stenting, LE's  . Paroxysmal atrial fibrillation     On Coumadin and rate control meds  . CAD (coronary artery disease)     a. Inf MI 1997 tx with POBA;  b. s/p BMS to RCA for restonosis;  b. NSTEMI 9/04 => LHC 06/22/03:  LM 30%, LAD and CFX irregs, mRCA 80% ISR, EF 60% => PCI: Taxus DES to mRCA  . Hemorrhoids   . Gastritis 06/24/03 & 12/08/06    EGD Christella Hartigan)  .  Hiatal hernia   . Gout   . Vitamin B 12 deficiency   . BPH (benign prostatic hyperplasia)   . Diverticular disease     via ACBE  . Herniated disc     L4, L5  . Near syncope 10/07/09  . Parkinson's disease     Per Dr. Terrace Arabia  . Chronic kidney disease     Chronic, with a creatinine in the range of 1.2-1.6;  hx of ARF 11/2009  . Renal calculus     Recurrent lithotripsy  . Hx of echocardiogram     a. Echo 10/2009:  mild LVH, EF 55-60%, b.  Echo 11/13:  mild LVH, EF 70-75%, mid cavity dynamic obstruction wth Valsalva (pk velocity 307 cm/sec, pk gradient 38 mmHg), mild MS, mean gradient 7 mmHg    Past Surgical History  Procedure Laterality Date  . Angioplasty  10/97    Multiple   . Lithotripsy   04/29/00    Due to renal lithiasis   . Cardiac catheterization  06/22/03    and PICA for in-stent restenosis  . Iliac vein angioplasty / stenting  05/16/04    Bilateral  . Laminectomy  09/28/07    L4,L5  (Dr. Ophelia Charter)  . Cholecystectomy open  10/23/2009    Attempted lap, purulent GB (Dr. Dwain Sarna)  . Cataract extraction      right eye 2013    Family History  Problem Relation Age of Onset  . Heart disease Mother   . Cancer Father     ? Skin  . Diabetes Sister   . Heart disease Brother   . Heart disease Brother     Social History Mr. Hangartner reports that he quit smoking about 26 years ago. He has never used smokeless tobacco. Mr. Flessner reports that he does not drink alcohol.  Review of Systems CONSTITUTIONAL: No weight loss, fever, chills, weakness or fatigue.  HEENT: Eyes: No visual loss, blurred vision, double vision or yellow sclerae. No hearing loss, sneezing, congestion, runny nose or sore throat.  SKIN: No rash or itching.  CARDIOVASCULAR: No chest pain, chest pressure or chest discomfort. No palpitations or edema.  RESPIRATORY: No shortness of breath, cough or sputum.  GASTROINTESTINAL: No anorexia, nausea, vomiting or diarrhea. No abdominal pain or blood.  GENITOURINARY: no polyuria, no dysuria NEUROLOGICAL: generalized fatigue, diffuse weakness MUSCULOSKELETAL: No muscle, back pain, joint pain or stiffness.  PSYCHIATRIC: No history of depression or anxiety.      Physical Examination Blood pressure 158/62, pulse 82, temperature 98.7 F (37.1 C), temperature source Oral, resp. rate 18, height 6' (1.829 m), weight 167 lb 12.3 oz (76.1 kg), SpO2 96.00%.  Intake/Output Summary (Last 24 hours) at 07/16/13 1105 Last data filed at 07/16/13 0531  Gross per 24 hour  Intake      0 ml  Output    700 ml  Net   -700 ml   Gen: NAD  Cardiovascular: RRR, no m/r/g, no JVD, no carotid bruits  Respiratory: CTAB  GI: abdomen soft, NT, ND  MSK: extremities warm, no  edema  Psych: appropriate affect   Lab Results  Basic Metabolic Panel:  Recent Labs Lab 07/14/13 1810 07/14/13 2205 07/15/13 0500 07/16/13 0500  NA 141  --  144 144  K 3.2*  --  3.8 3.6  CL 100  --  108 108  CO2 26  --  23 19  GLUCOSE 86  --  120* 107*  BUN 50*  --  42* 33*  CREATININE 1.78*  --  1.35 1.12  CALCIUM 9.4  --  8.4 8.8  MG  --  1.6  --   --     Liver Function Tests:  Recent Labs Lab 07/14/13 1810  AST 50*  ALT 6  ALKPHOS 96  BILITOT 0.7  PROT 7.3  ALBUMIN 3.1*    CBC:  Recent Labs Lab 07/14/13 1810 07/15/13 0500  WBC 9.6 7.3  NEUTROABS 6.7  --   HGB 12.1* 10.7*  HCT 36.0* 31.2*  MCV 89.8 89.7  PLT 276 232    Cardiac Enzymes:  Recent Labs Lab 07/14/13 1810 07/14/13 2205 07/15/13 0500  CKTOTAL  --  636*  --   TROPONINI <0.30 <0.30 <0.30    BNP: No components found with this basename: POCBNP,    ECG 07/14/13: afib, rate 114, pvc, no ischemic changes  Imaging 07/14/13 CT head FINDINGS:  Stable enlarged ventricles and subarachnoid spaces. No intracranial  hemorrhage, mass lesion or CT evidence of acute infarction. Marked  left maxillary sinus mucosal thickening/retention cyst formation  with improvement. Decreased right maxillary sinus mucosal  thickening/retention cyst with minimal residual. Unremarkable bones.  IMPRESSION:  1. No acute abnormality.  2. Stable atrophy.  3. Improving bilateral chronic maxillary sinusitis.  07/14/13 CXR FINDINGS:  The heart remains normal in size and the lungs are clear. Stable  mild scoliosis.  IMPRESSION:  No acute abnormality.  07/15/13 Echo LVEF 75-80%, cannot r/o WMA, cannot eval diastolic function, normal IVC   06/2003 Cath RESULTS: Left Main Coronary Artery: The left main coronary artery had a  30% distal stenosis.  Left Anterior Descending Artery: The left anterior descending gave rise to  two diagonal branches and a septal perforator. The LAD was irregular, but  there  was no major obstruction.  Circumflex Artery: The circumflex artery gave rise to a large intermediate  Loralei Radcliffe, which bifurcated in two sub-branches and an AV Erskine Steinfeldt which  terminated in the posterolateral Happy Begeman. These vessels were irregular, but  there was no significant obstruction.  Right Coronary Artery: The right coronary artery is a moderately large  vessel that gave rise to a posterior descending and a posterolateral Adriel Desrosier.  There was 80% narrowing in the mid right coronary artery within the previous  stent, which was diffuse within the stent.  VENTRICULOGRAPHIC DATA: Left Ventriculogram: The left ventriculogram  performed in the RAO projection showed good wall motion with no areas of  hypokinesis. The estimated ejection fraction was 60%.  Following stenting of the in-stent restenosis this improved from 80% to less  than 10%. There was a stepdown or a small edge tear at the proximal edge,  which we elected not to treat.  CONCLUSIONS:  1. Coronary artery disease status post remote diaphragmatic myocardial  infarction and remote stenting of the right coronary artery in 1998 with  30% narrowing in the left main coronary artery, irregularities in the  left anterior descending and circumflex arteries, 80% in-stent restenosis  in the mid right coronary artery, and normal left ventricular function.  2. Successful percutaneous coronary intervention for in-stent restenosis in  the mid right coronary artery using a TAXUS stent with improvement in  percent narrowing from 80% to less than 10%.  DISPOSITION: The patient was taken to the recovery room for further  observation. He will be at somewhat increased risk of recurrence due to the  diffuse disease in the segment proximal to the stented segment.    Assessment/Plan 1. Non-sustained VT - 5 beat episode asymptomatic, no evidence of sustained  arrhythmia - no evidence by history, EKG, or enzymes of acute coronary ischemia. Patient  continues to have hyperdynamic vigorous systolic function. - likely related to his history of prior CAD and prior MI - recommend starting low dose beta blocker, previously held per notes due to some low blood pressures and following his bp's and telemetry - do not recommend further cardiac workup at this time, I think given his overall poor functional status I would favor conservative management. Likely related to prior infarct.       Dina Rich, M.D., F.A.C.C.

## 2013-07-16 NOTE — Progress Notes (Signed)
Patient more alert beginning of night shift. He drank entire feeding supplement milkshake, and ate half a bowl of peaches. He wanted me to turn the television channel to football.

## 2013-07-17 DIAGNOSIS — I10 Essential (primary) hypertension: Secondary | ICD-10-CM

## 2013-07-17 DIAGNOSIS — I2510 Atherosclerotic heart disease of native coronary artery without angina pectoris: Secondary | ICD-10-CM

## 2013-07-17 DIAGNOSIS — G20 Parkinson's disease: Secondary | ICD-10-CM

## 2013-07-17 DIAGNOSIS — E039 Hypothyroidism, unspecified: Secondary | ICD-10-CM

## 2013-07-17 DIAGNOSIS — I4891 Unspecified atrial fibrillation: Secondary | ICD-10-CM

## 2013-07-17 LAB — BASIC METABOLIC PANEL
BUN: 30 mg/dL — ABNORMAL HIGH (ref 6–23)
CO2: 24 mEq/L (ref 19–32)
Chloride: 106 mEq/L (ref 96–112)
Creatinine, Ser: 0.9 mg/dL (ref 0.50–1.35)
GFR calc Af Amer: >90 mL/min (ref >90)
GFR calc non Af Amer: 78 mL/min — ABNORMAL LOW (ref >90)
Potassium: 3.4 mEq/L — ABNORMAL LOW (ref 3.5–5.1)
Sodium: 138 mEq/L (ref 135–145)

## 2013-07-17 LAB — FOLATE RBC: RBC Folate: 644 ng/mL — ABNORMAL HIGH (ref >=366)

## 2013-07-17 LAB — GLUCOSE, CAPILLARY
Glucose-Capillary: 139 mg/dL — ABNORMAL HIGH (ref 70–99)
Glucose-Capillary: 191 mg/dL — ABNORMAL HIGH (ref 70–99)
Glucose-Capillary: 173 mg/dL — ABNORMAL HIGH (ref 70–99)

## 2013-07-17 LAB — MAGNESIUM: Magnesium: 1.9 mg/dL (ref 1.5–2.5)

## 2013-07-17 MED ORDER — POTASSIUM CHLORIDE 10 MEQ/100ML IV SOLN
10.0000 meq | INTRAVENOUS | Status: AC
  Administered 2013-07-17 (×6): 10 meq via INTRAVENOUS
  Filled 2013-07-17 (×6): qty 100

## 2013-07-17 MED ORDER — QUETIAPINE FUMARATE 50 MG PO TABS
50.0000 mg | ORAL_TABLET | Freq: Every day | ORAL | Status: DC
  Administered 2013-07-17: 50 mg via ORAL
  Filled 2013-07-17 (×2): qty 1

## 2013-07-17 MED ORDER — METOPROLOL TARTRATE 12.5 MG HALF TABLET
12.5000 mg | ORAL_TABLET | Freq: Four times a day (QID) | ORAL | Status: DC
  Administered 2013-07-17 – 2013-07-18 (×3): 12.5 mg via ORAL
  Filled 2013-07-17 (×7): qty 1

## 2013-07-17 NOTE — Progress Notes (Signed)
Subjective: "I feel better than I have in a long time"  No CP  No dizziness No SOB  In bed. Objective: Filed Vitals:   07/16/13 1441 07/16/13 2107 07/17/13 0531 07/17/13 1539  BP: 142/60 130/62 114/54 132/46  Pulse: 96 82 79 87  Temp: 98.4 F (36.9 C) 97.5 F (36.4 C) 97.2 F (36.2 C) 98.5 F (36.9 C)  TempSrc: Oral Oral Oral Oral  Resp: 18 18 18 18   Height:      Weight:      SpO2: 93% 95% 96% 98%   Weight change:   Intake/Output Summary (Last 24 hours) at 07/17/13 1735 Last data filed at 07/17/13 1540  Gross per 24 hour  Intake    580 ml  Output    500 ml  Net     80 ml    General: Alert, awake in no acute distress Neck:  JVP is normal Heart: Regular rate and rhythm, without murmurs, rubs, gallops.  Lungs: Clear to auscultation.  No rales or wheezes. Exemities:  No edema.   Neuro: Responds approp to questions.   Tele:  Approximately 7 sec of sustained VT at 150 bpm.  Lab Results: Results for orders placed during the hospital encounter of 07/14/13 (from the past 24 hour(s))  GLUCOSE, CAPILLARY     Status: Abnormal   Collection Time    07/16/13  6:05 PM      Result Value Range   Glucose-Capillary 169 (*) 70 - 99 mg/dL  GLUCOSE, CAPILLARY     Status: Abnormal   Collection Time    07/16/13  9:05 PM      Result Value Range   Glucose-Capillary 161 (*) 70 - 99 mg/dL   Comment 1 Notify RN    BASIC METABOLIC PANEL     Status: Abnormal   Collection Time    07/17/13  5:20 AM      Result Value Range   Sodium 138  135 - 145 mEq/L   Potassium 3.4 (*) 3.5 - 5.1 mEq/L   Chloride 106  96 - 112 mEq/L   CO2 24  19 - 32 mEq/L   Glucose, Bld 161 (*) 70 - 99 mg/dL   BUN 30 (*) 6 - 23 mg/dL   Creatinine, Ser 9.60  0.50 - 1.35 mg/dL   Calcium 8.2 (*) 8.4 - 10.5 mg/dL   GFR calc non Af Amer 78 (*) >90 mL/min   GFR calc Af Amer >90  >90 mL/min  MAGNESIUM     Status: None   Collection Time    07/17/13  5:20 AM      Result Value Range   Magnesium 1.9  1.5 - 2.5 mg/dL   GLUCOSE, CAPILLARY     Status: Abnormal   Collection Time    07/17/13  8:10 AM      Result Value Range   Glucose-Capillary 139 (*) 70 - 99 mg/dL  GLUCOSE, CAPILLARY     Status: Abnormal   Collection Time    07/17/13 11:36 AM      Result Value Range   Glucose-Capillary 189 (*) 70 - 99 mg/dL  GLUCOSE, CAPILLARY     Status: Abnormal   Collection Time    07/17/13  4:30 PM      Result Value Range   Glucose-Capillary 191 (*) 70 - 99 mg/dL   Comment 1 Notify RN      Studies/Results: @RISRSLT24 @  Medications: Reviewed   @PROBHOSP @  1. Ventricular tachycardia.  Sustained spell for approx 7  sec today.  Stopped spontaneously   Discussed with EP   WOuld try low dose b blocker again.  Follow BP  He was on coreg 6.25 bid earlier  Lopressor should be at a lower effective dose.   Check dig level  May need to d/c  Normal LV systolic function on echo  2.  CAD  Deneis CP.    3.  Afib  Not candidate for anticoag due to falls.  Keep rate controlled.   LOS: 3 days   Dietrich Pates 07/17/2013, 5:35 PM

## 2013-07-17 NOTE — Progress Notes (Signed)
[facility]                                                                                Patient Demographics  Justin Lane, is a 79 y.o. male, DOB - December 18, 1929, RUE:[id number]  Admit date - 07/14/2013   Admitting Physician Catarina Hartshorn, MD  Outpatient Primary MD for the patient is Crawford Givens, MD  LOS - 3   Chief Complaint  Patient presents with  . Altered Mental Status        Assessment & Plan  Acute encephalopathy with delirium -Multifactorial including possible infectious process, dehydration, as well as his Levaquin, or progression of Parkinson Disease  -UA negative, Cardiac enzymes cycled and normal, TSH within normal limits 4.34 -Will continue to monitor -Neurology consulted and agreed with Seroquel, however increase the dose to 50 mg   Acute on chronic renal failure (CKD 3)--secondary to dehydration  -Resolved  Nonsustained Ventricular tachycardia -Cardiology consulted and recommended adding low dose betablocker -Will start metoprolol 12.5mg  BID  Atrial fibrillation  -Continue aspirin and digoxin.  Warfarin discontinued to fall risk.   Bilateral hip pain  -Xray: No evidence of fracture or dislocation.  Maxillary sinusitis  -Discontinue Levaquin which may be exacerbating his confusion and hallucinations  -Continue Augmentin  Hypokalemia  -Resolved, continue to monitor. -Magnesium 1.6, will replete and recheck.  Bilateral leg edema and pain  -Pending Venous duplex  Elevated proBNP  -May be related to the patient's increased heart rate from his dehydration  -Patient appears clinically volume depleted  -Echocardiogram results listed below  Diabetes mellitus type 2  -NovoLog sliding scale with accuchecks -Hemoglobin A1c 6.4 on 05/09/2013  Parkinson Disease -Continue current medication, neurology consulted. -Increased dose of Seroquel to 50mg  QHS   Active Problems:   B12 DEFICIENCY   Atrial fibrillation   Acute on chronic renal failure    Dehydration   Acute encephalopathy   Parkinson disease   Protein-calorie malnutrition, severe   Nonsustained ventricular tachycardia   Code Status: DNR  Family Communication: Family at bedside.  Disposition Plan: Admitted   Procedures  Echocardiogram Study Conclusions  - Left ventricle: The cavity size was normal. Wall thickness was normal. Systolic function was hyperdynamic. The estimated ejection fraction was in the range of 75% to 80%. There was no dynamic obstruction. Although no diagnostic regional wall motion abnormality was identified, this possibility cannot be completely excluded on the basis of this study. There was an increased relative contribution of atrial contraction to ventricular filling, which may be due to aging or hypovolemia. The study is not technically sufficient to allow evaluation of LV diastolic function. - Mitral valve: Moderately to severely calcified annulus. - Right ventricle: Systolic function was hyperdynamic. - Atrial septum: No defect or patent foramen ovale was identified.   Consults  None  DVT Prophylaxis  Heparin  Lab Results  Component Value Date   PLT 232 07/15/2013    Medications  Scheduled Meds: . amoxicillin-clavulanate  1 tablet Oral Q12H  . aspirin  81 mg Oral Daily  . atorvastatin  80 mg Oral q1800  . carbidopa-levodopa  1 tablet Oral QHS  . carbidopa-levodopa  1 tablet Oral Custom  . cholecalciferol  1,000 Units Oral Daily  . cilostazol  100 mg Oral BID  . digoxin  125 mcg Oral Daily  . docusate sodium  100 mg Oral Daily  . donepezil  10 mg Oral Daily  . dutasteride  0.5 mg Oral Daily  . feeding supplement (ENSURE COMPLETE)  237 mL Oral BID BM  . fluticasone  1 spray Each Nare Daily  . heparin  5,000 Units Subcutaneous Q8H  . insulin aspart  0-9 Units Subcutaneous TID WC  . levothyroxine  25 mcg Oral QAC breakfast  . potassium chloride  10 mEq Intravenous Q1 Hr x 6  . potassium chloride  10 mEq Oral Once  .  QUEtiapine  25 mg Oral QHS  . sodium chloride  3 mL Intravenous Q12H  . cyanocobalamin  1,000 mcg Oral Daily   Continuous Infusions: . sodium chloride 50 mL/hr at 07/17/13 0444   PRN Meds:.acetaminophen, acetaminophen, HYDROcodone-acetaminophen, ondansetron (ZOFRAN) IV, ondansetron  Antibiotics    Anti-infectives   Start     Dose/Rate Route Frequency Ordered Stop   07/14/13 2200  amoxicillin-clavulanate (AUGMENTIN) 500-125 MG per tablet 500 mg     1 tablet Oral Every 12 hours 07/14/13 2159         Time Spent in minutes   30 minutes   Jovontae Banko D.O. on 07/17/2013 at 8:29 AM  Between 7am to 7pm - Pager - [phone]  After 7pm go to [email or web address] - password [email or web address]  And look for the night coverage person covering for me after hours  [facility] Office  [phone]    Subjective:   Justin Lane seen and examined today.  Patient complains of feeling achy this morning and states his joints hurt. Patient denies dizziness, chest pain, shortness of breath, abdominal pain, N/V/D/C, new weakness, numbess, tingling.    Objective:   Filed Vitals:   07/16/13 0940 07/16/13 1441 07/16/13 2107 07/17/13 0531  BP: 158/62 142/60 130/62 114/54  Pulse: 82 96 82 79  Temp: 98.7 F (37.1 C) 98.4 F (36.9 C) 97.5 F (36.4 C) 97.2 F (36.2 C)  TempSrc: Oral Oral Oral Oral  Resp: 18 18 18 18   Height:      Weight:      SpO2: 96% 93% 95% 96%    Wt Readings from Last 3 Encounters:  07/14/13 76.1 kg (167 lb 12.3 oz)  06/12/13 80.854 kg (178 lb 4 oz)  05/31/13 80.74 kg (178 lb)     Intake/Output Summary (Last 24 hours) at 07/17/13 0829 Last data filed at 07/17/13 0354  Gross per 24 hour  Intake    400 ml  Output   1000 ml  Net   -600 ml    Exam  General: Well developed, well nourished, NAD, appears stated age  HEENT: NCAT, PERRLA, EOMI, Anicteic Sclera, mucous membranes moist.   Neck: Supple, no JVD, no masses  Cardiovascular: S1 S2 auscultated, no  rubs, murmurs or gallops.  Irregularly irregular.  Respiratory: Clear to auscultation bilaterally with equal chest rise  Abdomen: Soft, non-tender to palpation, + bowel sounds  Extremities: warm dry without cyanosis clubbing.  trace lower ext edema.  Neuro: AAOx3, cranial nerves grossly intact.   Skin: Without rashes exudates or nodules  Psych: Normal affect   Data Review   Micro Results Recent Results (from the past 240 hour(s))  CULTURE, BLOOD (ROUTINE X 2)     Status: None   Collection Time    07/08/13  1:20 AM      Result  Value Range Status   Specimen Description BLOOD RIGHT ARM   Final   Special Requests BOTTLES DRAWN AEROBIC AND ANAEROBIC 5CC   Final   Culture  Setup Time     Final   Value: 07/08/2013 17:07     Performed at [facility]   Culture     Final   Value: NO GROWTH 5 DAYS     Performed at [facility]   Report Status 07/14/2013 FINAL   Final  CULTURE, BLOOD (ROUTINE X 2)     Status: None   Collection Time    07/08/13  1:20 AM      Result Value Range Status   Specimen Description BLOOD LEFT FOREARM   Final   Special Requests BOTTLES DRAWN AEROBIC ONLY 5CC   Final   Culture  Setup Time     Final   Value: 07/08/2013 17:06     Performed at [facility]   Culture     Final   Value: NO GROWTH 5 DAYS     Performed at [facility]   Report Status 07/14/2013 FINAL   Final  CULTURE, BLOOD (ROUTINE X 2)     Status: None   Collection Time    07/14/13  6:50 PM      Result Value Range Status   Specimen Description BLOOD WRIST RIGHT   Final   Special Requests     Final   Value: BOTTLES DRAWN AEROBIC AND ANAEROBIC 5CC RED 6CCBLUE   Culture  Setup Time     Final   Value: 07/15/2013 01:28     Performed at [facility]   Culture     Final   Value:        BLOOD CULTURE RECEIVED NO GROWTH TO DATE CULTURE WILL BE HELD FOR 5 DAYS BEFORE ISSUING A FINAL NEGATIVE REPORT     Performed at [facility]   Report Status  PENDING   Incomplete  CULTURE, BLOOD (ROUTINE X 2)     Status: None   Collection Time    07/14/13  7:20 PM      Result Value Range Status   Specimen Description BLOOD ARM RIGHT   Final   Special Requests BOTTLES DRAWN AEROBIC ONLY 10CC   Final   Culture  Setup Time     Final   Value: 07/15/2013 01:27     Performed at [facility]   Culture     Final   Value:        BLOOD CULTURE RECEIVED NO GROWTH TO DATE CULTURE WILL BE HELD FOR 5 DAYS BEFORE ISSUING A FINAL NEGATIVE REPORT     Performed at [facility]   Report Status PENDING   Incomplete    Radiology Reports Dg Hip Bilateral W/pelvis  07/15/2013   *RADIOLOGY REPORT*  Clinical Data: Bilateral hip pain; recent fall.  BILATERAL HIP WITH PELVIS - 4+ VIEW  Comparison: CT of the abdomen and pelvis performed 11/16/2009  Findings: There is no evidence of fracture or dislocation.  Both femoral heads are seated normally within their respective acetabula.  Degenerative change is noted along the visualized lumbar spine.  The sacroiliac joints are unremarkable in appearance.  The visualized bowel gas pattern is grossly unremarkable in appearance.  IMPRESSION: No evidence of fracture or dislocation.   Original Report Authenticated By: Tonia Ghent, M.D.   Ct Head Wo Contrast  07/14/2013   CLINICAL DATA:  Increased hallucinations.  EXAM: CT HEAD  WITHOUT CONTRAST  TECHNIQUE: Contiguous axial images were obtained from the base of the skull through the vertex without intravenous contrast.  COMPARISON:  07/08/2013.  FINDINGS: Stable enlarged ventricles and subarachnoid spaces. No intracranial hemorrhage, mass lesion or CT evidence of acute infarction. Marked left maxillary sinus mucosal thickening/retention cyst formation with improvement. Decreased right maxillary sinus mucosal thickening/retention cyst with minimal residual. Unremarkable bones.  IMPRESSION: 1. No acute abnormality. 2. Stable atrophy. 3. Improving bilateral chronic maxillary  sinusitis.   Electronically Signed   By: Gordan Payment M.D.   On: 07/14/2013 20:09   Ct Head Wo Contrast  07/08/2013   *RADIOLOGY REPORT*  Clinical Data: Fall, altered mental status.  The  CT HEAD WITHOUT CONTRAST  Technique:  Contiguous axial images were obtained from the base of the skull through the vertex without contrast.  Comparison: None available at time of study interpretation.  Findings: The ventricles and sulci are normal for age.  No intraparenchymal hemorrhage, mass effect nor midline shift.  Patchy supratentorial white matter hypodensities are within normal range for patient's age and though non-specific suggest sequalae of chronic small vessel ischemic disease. No acute large vascular territory infarcts.  No abnormal extra-axial fluid collections.  Basal cisterns are patent.  Severe calcific atheroma atherosclerosis of the carotid siphons, to a lesser extent vertebral arteries.  No skull fracture.  Visualized paranasal sinuses and mastoid aircells are well-aerated. Status post bilateral ocular lens implants.  Moderate left maxillary sinusitis without paranasal sinus air-fluid levels, trace ethmoid mucosal thickening. 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  IMPRESSION: No acute intracranial process.  Moderate left maxillary sinusitis .  Involutional changes, mild to moderate white matter changes suggestive chronic small vessel ischemic disease.   Original Report Authenticated By: Awilda Metro   Dg Chest Port 1 View  07/14/2013   CLINICAL DATA:  Altered mental status. Hypertension. Ex-smoker.  EXAM: PORTABLE CHEST - 1 VIEW  COMPARISON:  07/08/2013.  FINDINGS: The heart remains normal in size and the lungs are clear. Stable mild scoliosis.  IMPRESSION: No acute abnormality.   Electronically Signed   By: Gordan Payment M.D.   On: 07/14/2013 18:42   Dg Chest Port 1 View  (if Code Sepsis Called)  07/08/2013   *RADIOLOGY REPORT*   Clinical Data: Fall, altered mental status.  PORTABLE CHEST - 1 VIEW  Comparison: Chest radiograph November 01, 2009  Findings: The cardiomediastinal silhouette is non suspicious, mild calcific atherosclerosis of the aortic knob as previously seen. The lungs are clear without pleural effusions or focal consolidations.  The pulmonary vasculature is unremarkable. Trachea projects midline and there is no pneumothorax.  The included soft tissue planes and osseous structures are unremarkable.  Multiple EKG lines overlay the patient and could obscure underlying subtle pathology.  IMPRESSION: No acute cardiopulmonary process.  Improved aeration of the lungs from November 01, 2009.   Original Report Authenticated By: Awilda Metro    CBC  Recent Labs Lab 07/14/13 1810 07/15/13 0500  WBC 9.6 7.3  HGB 12.1* 10.7*  HCT 36.0* 31.2*  PLT 276 232  MCV 89.8 89.7  MCH 30.2 30.7  MCHC 33.6 34.3  RDW 13.8 13.8  LYMPHSABS 1.8  --   MONOABS 1.2*  --   EOSABS 0.0  --   BASOSABS 0.0  --     Chemistries   Recent Labs Lab 07/14/13 1810 07/14/13 2205 07/15/13 0500 07/16/13 0500 07/17/13 0520  NA 141  --  144  144 138  K 3.2*  --  3.8 3.6 3.4*  CL 100  --  108 108 106  CO2 26  --  23 19 24   GLUCOSE 86  --  120* 107* 161*  BUN 50*  --  42* 33* 30*  CREATININE 1.78*  --  1.35 1.12 0.90  CALCIUM 9.4  --  8.4 8.8 8.2*  MG  --  1.6  --   --  1.9  AST 50*  --   --   --   --   ALT 6  --   --   --   --   ALKPHOS 96  --   --   --   --   BILITOT 0.7  --   --   --   --    ------------------------------------------------------------------------------------------------------------------ estimated creatinine clearance is 69.3 ml/min (by C-G formula based on Cr of 0.9). ------------------------------------------------------------------------------------------------------------------ No results found for this basename: HGBA1C,  in the last 72  hours ------------------------------------------------------------------------------------------------------------------ No results found for this basename: CHOL, HDL, LDLCALC, TRIG, CHOLHDL, LDLDIRECT,  in the last 72 hours ------------------------------------------------------------------------------------------------------------------  Recent Labs  07/14/13 2205  TSH 7.935*   ------------------------------------------------------------------------------------------------------------------  Recent Labs  07/14/13 2205  VITAMINB12 1703*    Coagulation profile  Recent Labs Lab 07/14/13 1810  INR 1.38    No results found for this basename: DDIMER,  in the last 72 hours  Cardiac Enzymes  Recent Labs Lab 07/14/13 1810 07/14/13 2205 07/15/13 0500  TROPONINI <0.30 <0.30 <0.30   ------------------------------------------------------------------------------------------------------------------ No components found with this basename: POCBNP,

## 2013-07-17 NOTE — Clinical Social Work Placement (Signed)
Clinical Social Work Department CLINICAL SOCIAL WORK PLACEMENT NOTE 07/17/2013  Patient:  Justin Lane, Justin Lane  Account Number:  [id number] Admit date:  07/14/2013  Clinical Social Worker:  Cherre Blanc, [place]  Date/time:  07/17/2013 03:36 PM  Clinical Social Work is seeking post-discharge placement for this patient at the following level of care:   SKILLED NURSING   (*CSW will update this form in Epic as items are completed)   07/17/2013  Patient/family provided with [facility] Department of Clinical Social Work's list of facilities offering this level of care within the geographic area requested by the patient (or if unable, by the patient's family).  07/17/2013  Patient/family informed of their freedom to choose among providers that offer the needed level of care, that participate in Medicare, Medicaid or managed care program needed by the patient, have an available bed and are willing to accept the patient.  07/17/2013  Patient/family informed of [facility]' ownership interest in [facility], as well as of the fact that they are under no obligation to receive care at this facility.  PASARR submitted to EDS on 07/17/2013 PASARR number received from EDS on 07/17/2013  FL2 transmitted to all facilities in geographic area requested by pt/family on  07/17/2013 FL2 transmitted to all facilities within larger geographic area on   Patient informed that his/her managed care company has contracts with or will negotiate with  certain facilities, including the following:     Patient/family informed of bed offers received:   Patient chooses bed at  Physician recommends and patient chooses bed at    Patient to be transferred to  on   Patient to be transferred to facility by   The following physician request were entered in Epic:   Additional Comments:   Roddie Mc, [place], [place], [phone]

## 2013-07-17 NOTE — Consult Note (Signed)
Physical Medicine and Rehabilitation Consult  Reason for Consult: Delirium with hallucinations.  Referring Physician:  Dr. Catha Gosselin.    HPI: Justin Lane is a 79 y.o. male with history of CAD, Parkinson's disease with gait disorder and cognitive decline, recent hypotensive episodes with falls recently. Evaluated in ED 07/08/13 for intermittent delirium and placed on levaquin for sinuitis. Patient with progressive confusion and hallucinations with inability to walk as well as FTT. Admitted on 07/14/13 for work up and Levaquin discontinued and patient treated for acute on chronic renal failure. CT head with stable atrophy. Patient with 5 beat V tach and cardiology consulted for input. As patient with negative troponin and LVEF 75-80%--low dose BB recommended by Dr. Wyline Mood. Neurology consulted for input and felt that visual hallucinations likely multifactorial but may be due to progression of PD. Seroquel added to help with symptoms. PT evaluation done this weekend and PT, MD recommending CIR.    Review of Systems  HENT: Negative for hearing loss.   Eyes: Negative for blurred vision and double vision.  Respiratory: Positive for shortness of breath. Negative for cough and wheezing.   Cardiovascular: Negative for chest pain and palpitations.  Gastrointestinal: Negative for heartburn and nausea.  Musculoskeletal: Positive for back pain, joint pain and myalgias.  Neurological: Positive for dizziness, sensory change (feet painful to touch), focal weakness (legs feel heavy) and weakness. Negative for headaches.   Past Medical History  Diagnosis Date  . GERD (gastroesophageal reflux disease)   . Hypothyroidism   . Anemia     Mild  . Hypertension   . Diabetes mellitus     Goal A1c ~8 due to concern for hypoglycemia  . Hyperlipidemia   . PVD (peripheral vascular disease)     Status post bilateral renal stenting, LE's  . Paroxysmal atrial fibrillation     On Coumadin and rate control meds  . CAD  (coronary artery disease)     a. Inf MI 1997 tx with POBA;  b. s/p BMS to RCA for restonosis;  b. NSTEMI 9/04 => LHC 06/22/03:  LM 30%, LAD and CFX irregs, mRCA 80% ISR, EF 60% => PCI: Taxus DES to mRCA  . Hemorrhoids   . Gastritis 06/24/03 & 12/08/06    EGD Christella Hartigan)  . Hiatal hernia   . Gout   . Vitamin B 12 deficiency   . BPH (benign prostatic hyperplasia)   . Diverticular disease     via ACBE  . Herniated disc     L4, L5  . Near syncope 10/07/09  . Parkinson's disease     Per Dr. Terrace Arabia  . Chronic kidney disease     Chronic, with a creatinine in the range of 1.2-1.6;  hx of ARF 11/2009  . Renal calculus     Recurrent lithotripsy  . Hx of echocardiogram     a. Echo 10/2009:  mild LVH, EF 55-60%, b.  Echo 11/13:  mild LVH, EF 70-75%, mid cavity dynamic obstruction wth Valsalva (pk velocity 307 cm/sec, pk gradient 38 mmHg), mild MS, mean gradient 7 mmHg    Past Surgical History  Procedure Laterality Date  . Angioplasty  10/97    Multiple   . Lithotripsy  04/29/00    Due to renal lithiasis   . Cardiac catheterization  06/22/03    and PICA for in-stent restenosis  . Iliac vein angioplasty / stenting  05/16/04    Bilateral  . Laminectomy  09/28/07    L4,L5  (Dr. Ophelia Charter)  . Cholecystectomy  open  10/23/2009    Attempted lap, purulent GB (Dr. Dwain Sarna)  . Cataract extraction      right eye 2013   Family History  Problem Relation Age of Onset  . Heart disease Mother   . Cancer Father     ? Skin  . Diabetes Sister   . Heart disease Brother   . Heart disease Brother    Social History:  Married. Was independent ADL and mobility with cane till 4-6 weeks ago. Has been getting HHPT for past two weeks. Wife supportive and has been assisting as needed.  He reports that he quit smoking about 26 years ago. He has never used smokeless tobacco. He reports that he does not drink alcohol or use illicit drugs.   Allergies  Allergen Reactions  . Codeine Sulfate     REACTION: nausea and vomiting    Medications Prior to Admission  Medication Sig Dispense Refill  . aspirin 81 MG tablet Take 81 mg by mouth daily.       Marland Kitchen atorvastatin (LIPITOR) 80 MG tablet take 1 tablet by mouth every evening  30 tablet  12  . AVODART 0.5 MG capsule take 1 capsule by mouth once daily  30 capsule  12  . carbidopa-levodopa (SINEMET CR) 50-200 MG per tablet Take 1 tablet by mouth at bedtime.  30 tablet  6  . carbidopa-levodopa (SINEMET IR) 25-100 MG per tablet Take 1 tablet by mouth 3 (three) times daily. 1 tab at 8am, 1 pm, 6pm  90 tablet  6  . Cholecalciferol (VITAMIN D3) 1000 UNITS capsule Take 1,000 Units by mouth daily.        . cilostazol (PLETAL) 100 MG tablet take 1 tablet by mouth twice a day  60 tablet  5  . cyanocobalamin 500 MCG tablet Take 1,000 mcg by mouth daily.       . digoxin (LANOXIN) 0.125 MG tablet Take 1 tablet (125 mcg total) by mouth daily.  30 tablet  5  . docusate sodium (COLACE) 100 MG capsule Take 1 capsule (100 mg total) by mouth daily.      Marland Kitchen donepezil (ARICEPT) 10 MG tablet Take 1 tablet (10 mg total) by mouth daily.  30 tablet  6  . Ferrous Sulfate (RA IRON) 27 MG TABS Take 1 tablet by mouth daily.        . furosemide (LASIX) 40 MG tablet Take 1 a day.  If you have more swelling, you can take a second dose if needed.  30 tablet  3  . HYDROcodone-acetaminophen (NORCO/VICODIN) 5-325 MG per tablet Take 0.5-1 tablets by mouth every 6 (six) hours as needed for pain.  30 tablet  0  . insulin aspart (NOVOLOG) 100 UNIT/ML injection Inject 12-15 Units into the skin 2 (two) times daily.  1 vial    . levofloxacin (LEVAQUIN) 500 MG tablet Take 1 tablet (500 mg total) by mouth daily.  7 tablet  0  . levothyroxine (SYNTHROID, LEVOTHROID) 25 MCG tablet TAKE 1 TABLET BY MOUTH DAILY  30 tablet  6  . mometasone (NASONEX) 50 MCG/ACT nasal spray Place 2 sprays into the nose daily.  17 g  12  . oxymetazoline (AFRIN NASAL SPRAY) 0.05 % nasal spray Place 2 sprays into the nose 2 (two) times daily.   30 mL  0  . potassium chloride SA (K-DUR,KLOR-CON) 20 MEQ tablet Take 1 tablet (20 mEq total) by mouth daily.  32 tablet  11  . nitroGLYCERIN (NITROSTAT) 0.3 MG SL  tablet Place 1 tablet (0.3 mg total) under the tongue every 5 (five) minutes as needed.  25 tablet  6    Home: Home Living Family/patient expects to be discharged to:: Private residence Living Arrangements: Spouse/significant other Available Help at Discharge: Family Type of Home: House Home Access: Ramped entrance Home Layout: One level Home Equipment: Environmental consultant - 2 wheels;Bedside commode;Shower seat;Wheelchair - manual Additional Comments: per wife and granddaughter, patient was ambulatory until about a week ago and has been dependent for transfers since that time  Functional History:   Functional Status:  Mobility: Bed Mobility Bed Mobility: Supine to Sit;Sit to Supine Supine to Sit: 2: Max assist Sit to Supine: 1: +2 Total assist Sit to Supine: Patient Percentage: 20%        ADL:    Cognition: Cognition Overall Cognitive Status: Difficult to assess (appeared intact; able to recall anniversary) Orientation Level: Oriented to person Cognition Arousal/Alertness: Lethargic Behavior During Therapy: WFL for tasks assessed/performed Overall Cognitive Status: Difficult to assess (appeared intact; able to recall anniversary) Difficult to assess due to: Level of arousal  Blood pressure 114/54, pulse 79, temperature 97.2 F (36.2 C), temperature source Oral, resp. rate 18, height 6' (1.829 m), weight 76.1 kg (167 lb 12.3 oz), SpO2 96.00%. Physical Exam  Nursing note and vitals reviewed. Constitutional: He is oriented to person, place, and time. He appears well-developed and well-nourished.  HENT:  Head: Normocephalic and atraumatic.  Eyes: Conjunctivae are normal. Pupils are equal, round, and reactive to light.  Neck: Normal range of motion. No JVD present. No tracheal deviation present. No thyromegaly present.   Cardiovascular: Normal rate.   Murmur heard. Respiratory: Effort normal and breath sounds normal. No respiratory distress. He has no wheezes.  GI: Soft. Bowel sounds are normal. He exhibits no distension. There is no tenderness.  Musculoskeletal: He exhibits tenderness (toes sensitive to touch.).  Neurological: He is oriented to person, place, and time.  Sensory deficits BLE. Bilateral pill rolling tremor, cogwheeling rigidity upper and lowers. Oriented to month and year with extra time and cues. Initiated little conversation. Lacks basic insight and awareness. He was quite alert. UE strength grossly 3+ to 4/5. LE are 2-3/5 prox to 4/5 distally. Masked facies.   Skin: Skin is dry.  Left shin with tender, ecchymotic area (trauma from fall)    Results for orders placed during the hospital encounter of 07/14/13 (from the past 24 hour(s))  GLUCOSE, CAPILLARY     Status: Abnormal   Collection Time    07/16/13  6:05 PM      Result Value Range   Glucose-Capillary 169 (*) 70 - 99 mg/dL  GLUCOSE, CAPILLARY     Status: Abnormal   Collection Time    07/16/13  9:05 PM      Result Value Range   Glucose-Capillary 161 (*) 70 - 99 mg/dL   Comment 1 Notify RN    BASIC METABOLIC PANEL     Status: Abnormal   Collection Time    07/17/13  5:20 AM      Result Value Range   Sodium 138  135 - 145 mEq/L   Potassium 3.4 (*) 3.5 - 5.1 mEq/L   Chloride 106  96 - 112 mEq/L   CO2 24  19 - 32 mEq/L   Glucose, Bld 161 (*) 70 - 99 mg/dL   BUN 30 (*) 6 - 23 mg/dL   Creatinine, Ser 4.54  0.50 - 1.35 mg/dL   Calcium 8.2 (*) 8.4 - 10.5 mg/dL  GFR calc non Af Amer 78 (*) >90 mL/min   GFR calc Af Amer >90  >90 mL/min  MAGNESIUM     Status: None   Collection Time    07/17/13  5:20 AM      Result Value Range   Magnesium 1.9  1.5 - 2.5 mg/dL  GLUCOSE, CAPILLARY     Status: Abnormal   Collection Time    07/17/13  8:10 AM      Result Value Range   Glucose-Capillary 139 (*) 70 - 99 mg/dL   No results  found.  Assessment/Plan: Diagnosis: progressive parkinson's disease  1. Does the need for close, 24 hr/day medical supervision in concert with the patient's rehab needs make it unreasonable for this patient to be served in a less intensive setting? No and Potentially 2. Co-Morbidities requiring supervision/potential complications: afib, dm, gout, htn 3. Due to bladder management, bowel management, safety, skin/wound care, disease management, medication administration, pain management and patient education, does the patient require 24 hr/day rehab nursing? Potentially 4. Does the patient require coordinated care of a physician, rehab nurse, PT (1-2 hrs/day, 5 days/week), OT (1-2 hrs/day, 5 days/week) and SLP (1-2 hrs/day, 5 days/week) to address physical and functional deficits in the context of the above medical diagnosis(es)? Potentially Addressing deficits in the following areas: balance, endurance, locomotion, strength, transferring, bowel/bladder control, bathing, dressing, feeding, grooming, toileting, cognition, speech, swallowing and psychosocial support 5. Can the patient actively participate in an intensive therapy program of at least 3 hrs of therapy per day at least 5 days per week? No and Potentially 6. The potential for patient to make measurable gains while on inpatient rehab is fair 7. Anticipated functional outcomes upon discharge from inpatient rehab are min to mod assist with PT, mod assist with OT, min to mod assist with SLP. 8. Estimated rehab length of stay to reach the above functional goals is: ?3weeks if at all 9. Does the patient have adequate social supports to accommodate these discharge functional goals? Potentially 10. Anticipated D/C setting: Home 11. Anticipated post D/C treatments: other 12. Overall Rehab/Functional Prognosis: fair  RECOMMENDATIONS: This patient's condition is appropriate for continued rehabilitative care in the following setting: SNF Patient has  agreed to participate in recommended program. n/a Note that insurance prior authorization may be required for reimbursement for recommended care.  Comment: I spoke with the patient's wife at length. She states that just over a month ago he was fully mobile at home. She admits now that she would have quite a difficult time handling him. He appears to have shown some improvement over the last 24 hours or so. However, I am not sure he would return to a "manageable" functional level after an inpatient rehab stay. Furthermore, I suspect he would not tolerate the intensity of our program. If he does show further function improvement, I would be willing to reconsider, but at this point, I think the most realistic option is SNF. Rehab RN to follow up.    Ranelle Oyster, MD, [facility] Physical Medicine & Rehabilitation     07/17/2013

## 2013-07-17 NOTE — Telephone Encounter (Signed)
Left message on home voicemail asking how patient is doing, pt was admitted on 07/15/13, Dr. Para March is aware

## 2013-07-17 NOTE — Clinical Social Work Psychosocial (Signed)
Clinical Social Work Department BRIEF PSYCHOSOCIAL ASSESSMENT 07/17/2013  Patient:  Justin Lane, Justin Lane     Account Number:  [id number]     Admit date:  07/14/2013  Clinical Social Worker:  Lavell Luster  Date/Time:  07/17/2013 02:05 PM  Referred by:  Physician  Date Referred:  07/17/2013 Referred for  SNF Placement   Other Referral:   Interview type:  Other - See comment Other interview type:   Patient's wife and grandaughter were interviewed for assessment.    PSYCHOSOCIAL DATA Living Status:  WIFE Admitted from facility:   Level of care:   Primary support name:  Justin Lane [phone] Primary support relationship to patient:  SPOUSE Degree of support available:   Support is strong. Patient has had family at bedside. Other contact information: Justin Lane (daughter) [phone]    CURRENT CONCERNS Current Concerns  Post-Acute Placement   Other Concerns:    SOCIAL WORK ASSESSMENT / PLAN Current plan is for patient to be admitted to CIR, but CSW met with patient and family to discuss SNF as backup plan to CIR, if patient is not a candidate for CIR. Wife and grandaughter are agreeable, but want patient to go to CIR if at all possible. Family prefers [facility] for SNF if SNF placement is necessary. Patient was living at home with wife and was receiving HHPT, which, per wife, is not enough for patient. Ultimate plan is for patient to come home after rehab.   Assessment/plan status:  Psychosocial Support/Ongoing Assessment of Needs Other assessment/ plan:   Complete FL2, Faxout, PASRR   Information/referral to community resources:   SNF list and CSW contact information left with patient and family.    PATIENT'S/FAMILY'S RESPONSE TO PLAN OF CARE: Family is agreeable to looking at SNF options as backup to CIR. Family awaits bed offers. Patient and family pleasant and appreciative of CSW contact information.     Justin Lane, [place], [place],  [phone]

## 2013-07-18 DIAGNOSIS — D649 Anemia, unspecified: Secondary | ICD-10-CM

## 2013-07-18 DIAGNOSIS — R109 Unspecified abdominal pain: Secondary | ICD-10-CM

## 2013-07-18 LAB — DIGOXIN LEVEL: Digoxin Level: 0.8 ng/mL (ref 0.8–2.0)

## 2013-07-18 LAB — GLUCOSE, CAPILLARY
Glucose-Capillary: 143 mg/dL — ABNORMAL HIGH (ref 70–99)
Glucose-Capillary: 167 mg/dL — ABNORMAL HIGH (ref 70–99)

## 2013-07-18 LAB — POTASSIUM: Potassium: 4.1 mEq/L (ref 3.5–5.1)

## 2013-07-18 MED ORDER — METOPROLOL TARTRATE 12.5 MG HALF TABLET: 12.5000 mg | ORAL_TABLET | Freq: Four times a day (QID) | ORAL | Status: DC

## 2013-07-18 MED ORDER — INSULIN ASPART 100 UNIT/ML ~~LOC~~ SOLN: 0.0000 [IU] | Freq: Three times a day (TID) | SUBCUTANEOUS | Status: DC

## 2013-07-18 MED ORDER — AMOXICILLIN-POT CLAVULANATE 500-125 MG PO TABS: 1.0000 | ORAL_TABLET | Freq: Two times a day (BID) | ORAL | Status: DC

## 2013-07-18 MED ORDER — INFLUENZA VAC SPLIT QUAD 0.5 ML IM SUSP
0.5000 mL | Freq: Once | INTRAMUSCULAR | Status: AC
  Administered 2013-07-18: 0.5 mL via INTRAMUSCULAR
  Filled 2013-07-18: qty 0.5

## 2013-07-18 MED ORDER — ENSURE COMPLETE PO LIQD: 237.0000 mL | Freq: Two times a day (BID) | ORAL | Status: DC

## 2013-07-18 MED ORDER — HYDROCODONE-ACETAMINOPHEN 5-325 MG PO TABS: 0.5000 | ORAL_TABLET | Freq: Four times a day (QID) | ORAL | Status: DC | PRN

## 2013-07-18 MED ORDER — INFLUENZA VAC SPLIT QUAD 0.5 ML IM SUSP: 0.5000 mL | INTRAMUSCULAR | Status: DC

## 2013-07-18 MED ORDER — QUETIAPINE FUMARATE 50 MG PO TABS: 50.0000 mg | ORAL_TABLET | Freq: Every day | ORAL | Status: DC

## 2013-07-18 MED ORDER — POTASSIUM CHLORIDE CRYS ER 10 MEQ PO TBCR: 10.0000 meq | EXTENDED_RELEASE_TABLET | Freq: Once | ORAL | Status: DC

## 2013-07-18 NOTE — Progress Notes (Signed)
Physical Therapy Treatment Patient Details Name: Justin Lane MRN: [id number] DOB: 1930/04/30 Today's Date: 07/18/2013 Time: 4098-1191 PT Time Calculation (min): 8 min  PT Assessment / Plan / Recommendation  History of Present Illness 79 year old male with a history of stage V Parkinson's disease, atrial fibrillation, diabetes mellitus, hypothyroidism, hyperlipidemia, hypertension, CAD, CKD stage III presents with five-day history of increasing confusion and hallucinations   PT Comments   Pt much more alert and much improved mobility.  Follow Up Recommendations  SNF (CIR denied)     Does the patient have the potential to tolerate intense rehabilitation     Barriers to Discharge        Equipment Recommendations  None recommended by PT    Recommendations for Other Services    Frequency Min 3X/week   Progress towards PT Goals Progress towards PT goals: Progressing toward goals  Plan Discharge plan needs to be updated    Precautions / Restrictions Precautions Precautions: Fall   Pertinent Vitals/Pain See flow sheet.    Mobility  Bed Mobility Bed Mobility: Supine to Sit;Sitting - Scoot to Edge of Bed Supine to Sit: 4: Min assist;HOB elevated;With rails Sitting - Scoot to Edge of Bed: 4: Min assist Details for Bed Mobility Assistance: Assist to bring trunk up and hips to EOB. Transfers Transfers: Sit to Stand;Stand to Sit Sit to Stand: 4: Min assist;3: Mod assist;With upper extremity assist;From bed;From chair/3-in-1 Stand to Sit: 4: Min assist;With upper extremity assist;With armrests Details for Transfer Assistance: Verbal cues for hand placement.  Min A from bed and mod A from chair that didn't have armrests.  Assist to bring hips up. Ambulation/Gait Ambulation/Gait Assistance: 1: +2 Total assist Ambulation/Gait: Patient Percentage: 70% Ambulation Distance (Feet): 6 Feet (x 2) Assistive device: Rolling walker Ambulation/Gait Assistance Details: Verbal cues to incr rt  step length and to extend knees, hips, and trunk. Gait Pattern: Step-to pattern;Decreased step length - right;Left flexed knee in stance;Right flexed knee in stance;Trunk flexed;Narrow base of support Gait velocity: slow    Exercises     PT Diagnosis:    PT Problem List:   PT Treatment Interventions:     PT Goals (current goals can now be found in the care plan section)    Visit Information  Last PT Received On: 07/18/13 Assistance Needed: +2 History of Present Illness: 79 year old male with a history of stage V Parkinson's disease, atrial fibrillation, diabetes mellitus, hypothyroidism, hyperlipidemia, hypertension, CAD, CKD stage III presents with five-day history of increasing confusion and hallucinations    Subjective Data      Cognition  Cognition Arousal/Alertness: Awake/alert Behavior During Therapy: WFL for tasks assessed/performed Overall Cognitive Status: Within Functional Limits for tasks assessed    Balance  Static Sitting Balance Static Sitting - Balance Support: Bilateral upper extremity supported Static Sitting - Level of Assistance: 6: Modified independent (Device/Increase time) Static Standing Balance Static Standing - Balance Support: Bilateral upper extremity supported Static Standing - Level of Assistance: 4: Min assist  End of Session PT - End of Session Equipment Utilized During Treatment: Gait belt Activity Tolerance: Patient tolerated treatment well Patient left: in chair;with call bell/phone within reach;with family/visitor present Nurse Communication: Mobility status   GP     Jordie Schreur 07/18/2013, 10:39 AM  [employer] PT [phone]

## 2013-07-18 NOTE — Progress Notes (Signed)
     SUBJECTIVE: Feeling better. No chest pain.   Tele: NSR.  BP 113/53  Pulse 80  Temp(Src) 98.3 F (36.8 C) (Oral)  Resp 18  Ht 6' (1.829 m)  Wt 167 lb 12.3 oz (76.1 kg)  BMI 22.75 kg/m2  SpO2 100%  Intake/Output Summary (Last 24 hours) at 07/18/13 1134 Last data filed at 07/18/13 1021  Gross per 24 hour  Intake   4835 ml  Output   2050 ml  Net   2785 ml    PHYSICAL EXAM General: Well developed, well nourished, in no acute distress. Alert and oriented x 3.  Psych:  Good affect, responds appropriately Neck: No JVD. No masses noted.  Lungs: Clear bilaterally with no wheezes or rhonci noted.  Heart: RRR with no murmurs noted. Abdomen: Bowel sounds are present. Soft, non-tender.  Extremities: No lower extremity edema.   LABS: Basic Metabolic Panel:  Recent Labs  30/86/57 0500 07/17/13 0520 07/18/13 0730  NA 144 138  --   K 3.6 3.4* 4.1  CL 108 106  --   CO2 19 24  --   GLUCOSE 107* 161*  --   BUN 33* 30*  --   CREATININE 1.12 0.90  --   CALCIUM 8.8 8.2*  --   MG  --  1.9  --    Current Meds: . amoxicillin-clavulanate  1 tablet Oral Q12H  . aspirin  81 mg Oral Daily  . atorvastatin  80 mg Oral q1800  . carbidopa-levodopa  1 tablet Oral QHS  . carbidopa-levodopa  1 tablet Oral Custom  . cholecalciferol  1,000 Units Oral Daily  . cilostazol  100 mg Oral BID  . docusate sodium  100 mg Oral Daily  . donepezil  10 mg Oral Daily  . dutasteride  0.5 mg Oral Daily  . feeding supplement (ENSURE COMPLETE)  237 mL Oral BID BM  . fluticasone  1 spray Each Nare Daily  . heparin  5,000 Units Subcutaneous Q8H  . insulin aspart  0-9 Units Subcutaneous TID WC  . levothyroxine  25 mcg Oral QAC breakfast  . metoprolol tartrate  12.5 mg Oral Q6H  . potassium chloride  10 mEq Oral Once  . QUEtiapine  50 mg Oral QHS  . sodium chloride  3 mL Intravenous Q12H  . cyanocobalamin  1,000 mcg Oral Daily   ASSESSMENT AND PLAN:  1. Non-sustained VT: No recurrence. Known to  have normal LV function. No further workup. Continue beta blocker.   2. Atrial fibrillation: Chronic, persistent. Rate controlled.  Not on anti-coagulation given instability, recent history of falls. Continue beta blocker. Digoxin has been held.    Daphnee Preiss  10/14/201411:34 AM

## 2013-07-18 NOTE — Discharge Summary (Signed)
Physician Discharge Summary  STANTON KISSOON ZOX:[id number] DOB: Feb 20, 1930 DOA: 07/14/2013  PCP: Crawford Givens, MD  Admit date: 07/14/2013 Discharge date: 07/18/2013  Time spent: 35 minutes  Recommendations for Outpatient Follow-up:  Patient will need followup with primary care physician within one to 2 weeks of discharge. He should also follow up physician at the nursing home. Patient should have labs drawn within 3 days of being at the nursing home to follow his potassium levels.  Discharge Diagnoses:   Acute encephalopathy with delirum likely related to levaquin or underlying  Parkinson Disease Active Problems:   B12 DEFICIENCY   Atrial fibrillation   Acute on chronic renal failure   Dehydration   Parkinson disease   Protein-calorie malnutrition, severe   Nonsustained ventricular tachycardia   Bilateral hip pain   Sinusitis   Discharge Condition: Stable  Diet recommendation: heart healthy  Filed Weights   07/14/13 2300  Weight: 76.1 kg (167 lb 12.3 oz)    History of present illness:  79 year old male with a history of atrial fibrillation, diabetes mellitus, hypothyroidism, hyperlipidemia, hypertension, CAD, CKD stage III presents with five-day history of increasing confusion and hallucinations. The patient's daughter and wife are at the bedside to supplement history. The patient was in emergency department on 07/08/2013 because of increasing confusion. It was thought that this may have been due to the patient's sinusitis as well as taking his Sinemet incorrectly. The patient and his family wanted to go home at that time and was discharged from the ED with a prescription for Levaquin. He has been on Levaquin 500 mg daily since 07/09/2013. Since that time, the patient has had increasing confusion and hallucinations. The patient is essentially bedbound for the past 2-3 months which was thought to be partly due to his progressive Parkinson's disease. There's been no history of  vomiting, diarrhea, headache, chest discomfort, shortness of breath, coughing, abdominal pain, dysuria, hematuria, hematochezia, melena. The wife reports decreased by mouth intake for the past one to 2 days.  The patient was recently taken off of his warfarin due to frequent falls. In addition his beta blocker has recently been stopped due soft blood pressures. The patient slid off of the chair last Friday, one week prior to this admission. In the ED, the patient was found to have a serum creatinine 1.78, potassium 3.2. His CBC was unremarkable. ProBNP was 3907. Chest x-ray was clear. Lactic acid was 1.95. EKG showed A. fibrillation with a heart rate of 114. Initial troponin was negative.   Hospital Course:  This is an 79 year old Caucasian male past medical history of atrial fibrillation, diabetes mellitus, hypothyroidism, hypertension, CAD stage III presents emergency department with altered mental status. According to patient's family he was having visual limitations. Patient was recently seen in the emergency department with some have sinusitis on 07/09/2013 and was given a prescription for Levaquin. On admission his Levaquin was discontinued as this may have contributed to his altered mental status. Patient was then placed on Augmentin. To continue this for the duration of 10 more days.  During his admission patient did have several test conducted including a urinalysis negative, cardiac enzymes were cycled found to be normal, his TSH was also found to be within normal limits a 4.34. Patient remained afebrile no leukocytosis. Therefore infectious process relating to his acute encephalopathy is unlikely. Neurology was also consulted. It was thought that his delirium may be in part due to progression of his Parkinson's disease. Patient will start on Seroquel 25 mg however  increased to 50 mg nightly.    Patient does have atrial fibrillation however was found to be not a good candidate for Coumadin therapy  due to recurrent falls. He was taken off of his Coumadin, as well as digoxin.  Patient had non-sustained ventricular tachycardia during his stay as well. Cardiology was also consulted. Metoprolol 12.5 mg was started 4 times daily.  Many of the patient's hypertension medications were discontinued due to dehydration, hypotension.  Patient also complained of bilateral hip pain on admission. Chest x-ray showed no evidence of fracture dislocation. This is probably due to underlying arthritis.  Patient was found to have hypokalemia as well as slightly hypomagnesemia. This these were both repleted. Patient should have his magnesium as well as his potassium levels monitored while in the nursing home.    Upon admission patient did have an elevated BNP.  Echocardiogram was conducted results are listed below. Patient was found to have a hyperdynamic systolic function with an EF of 75-80%.  As for his diabetes mellitus. Patient was placed on a sliding insulin scale along with Accu-Cheks. His hemoglobin A1c was conducted in August 2014 and found be 6.4  Due to patient's deconditioning. Patient was seen by physical therapy. They did recommend a rehabilitation. Our inpatient rehabilitation program was consulted on patient to be a better candidate for nursing home placement.  Procedures: Echocardiogram  Study Conclusions  - Left ventricle: The cavity size was normal. Wall thickness was normal. Systolic function was hyperdynamic. The estimated ejection fraction was in the range of 75% to 80%. There was no dynamic obstruction. Although no diagnostic regional wall motion abnormality was identified, this possibility cannot be completely excluded on the basis of this study. There was an increased relative contribution of atrial contraction to ventricular filling, which may be due to aging or hypovolemia. The study is not technically sufficient to allow evaluation of LV diastolic function. - Mitral valve:  Moderately to severely calcified annulus. - Right ventricle: Systolic function was hyperdynamic. - Atrial septum: No defect or patent foramen ovale was identified.  Consultations:  Cardiology  Discharge Exam: Filed Vitals:   07/18/13 0956  BP: 113/53  Pulse: 80  Temp: 98.3 F (36.8 C)  Resp: 18    General: Well developed, well nourished, NAD, appears stated age  HEENT: NCAT, PERRLA, EOMI, Anicteic Sclera, mucous membranes moist. Neck: Supple, no JVD, no masses  Cardiovascular: S1 S2 auscultated, no rubs, murmurs or gallops. Irregularly irregular.  Respiratory: Clear to auscultation bilaterally with equal chest rise  Abdomen: Soft, non-tender to palpation, + bowel sounds  Extremities: warm dry without cyanosis clubbing. trace lower ext edema.  Neuro: AAOx3, cranial nerves grossly intact.  Skin: Without rashes exudates or nodules  Psych: Normal affect    Discharge Instructions  Discharge Orders   Future Appointments Provider Department Dept Phone   10/19/2013 11:30 AM Levert Feinstein, MD [facility] [phone]   Future Orders Complete By Expires   Diet - low sodium heart healthy  As directed    Discharge instructions  As directed    Comments:     Patient will need followup with primary care physician within one to 2 weeks of discharge. He should also follow up physician at the nursing home. Patient should have labs drawn within 3 days of being at the nursing home to follow his potassium levels.   Increase activity slowly  As directed        Medication List    STOP taking these medications  digoxin 0.125 MG tablet  Commonly known as:  LANOXIN     furosemide 40 MG tablet  Commonly known as:  LASIX     levofloxacin 500 MG tablet  Commonly known as:  LEVAQUIN     oxymetazoline 0.05 % nasal spray  Commonly known as:  AFRIN NASAL SPRAY     RA IRON 27 MG Tabs  Generic drug:  Ferrous Sulfate      TAKE these medications        amoxicillin-clavulanate 500-125 MG per tablet  Commonly known as:  AUGMENTIN  Take 1 tablet (500 mg total) by mouth every 12 (twelve) hours.     aspirin 81 MG tablet  Take 81 mg by mouth daily.     atorvastatin 80 MG tablet  Commonly known as:  LIPITOR  take 1 tablet by mouth every evening     AVODART 0.5 MG capsule  Generic drug:  dutasteride  take 1 capsule by mouth once daily     carbidopa-levodopa 25-100 MG per tablet  Commonly known as:  SINEMET IR  Take 1 tablet by mouth 3 (three) times daily. 1 tab at 8am, 1 pm, 6pm     carbidopa-levodopa 50-200 MG per tablet  Commonly known as:  SINEMET CR  Take 1 tablet by mouth at bedtime.     Cholecalciferol 1000 UNITS capsule  Take 1,000 Units by mouth daily.     cilostazol 100 MG tablet  Commonly known as:  PLETAL  take 1 tablet by mouth twice a day     cyanocobalamin 500 MCG tablet  Take 1,000 mcg by mouth daily.     docusate sodium 100 MG capsule  Commonly known as:  COLACE  Take 1 capsule (100 mg total) by mouth daily.     donepezil 10 MG tablet  Commonly known as:  ARICEPT  Take 1 tablet (10 mg total) by mouth daily.     feeding supplement (ENSURE COMPLETE) Liqd  Take 237 mLs by mouth 2 (two) times daily between meals.     HYDROcodone-acetaminophen 5-325 MG per tablet  Commonly known as:  NORCO/VICODIN  Take 0.5-1 tablets by mouth every 6 (six) hours as needed.     insulin aspart 100 UNIT/ML injection  Commonly known as:  novoLOG  Inject 0-9 Units into the skin 3 (three) times daily with meals.     levothyroxine 25 MCG tablet  Commonly known as:  SYNTHROID, LEVOTHROID  TAKE 1 TABLET BY MOUTH DAILY     metoprolol tartrate 12.5 mg Tabs tablet  Commonly known as:  LOPRESSOR  Take 0.5 tablets (12.5 mg total) by mouth every 6 (six) hours.     mometasone 50 MCG/ACT nasal spray  Commonly known as:  NASONEX  Place 2 sprays into the nose daily.     nitroGLYCERIN 0.3 MG SL tablet  Commonly known as:  NITROSTAT   Place 1 tablet (0.3 mg total) under the tongue every 5 (five) minutes as needed.     potassium chloride 10 MEQ tablet  Commonly known as:  K-DUR,KLOR-CON  Take 1 tablet (10 mEq total) by mouth once.     QUEtiapine 50 MG tablet  Commonly known as:  SEROQUEL  Take 1 tablet (50 mg total) by mouth at bedtime.       Allergies  Allergen Reactions  . Codeine Sulfate     REACTION: nausea and vomiting       Follow-up Information   Follow up with Crawford Givens, MD. Schedule an appointment as  soon as possible for a visit in 2 weeks.   Specialty:  Family Medicine   Contact information:   [address] [place] [address] [phone]       Follow up with Physician at nursin home In 2 days.       The results of significant diagnostics from this hospitalization (including imaging, microbiology, ancillary and laboratory) are listed below for reference.    Significant Diagnostic Studies: Dg Hip Bilateral W/pelvis  07/15/2013   *RADIOLOGY REPORT*  Clinical Data: Bilateral hip pain; recent fall.  BILATERAL HIP WITH PELVIS - 4+ VIEW  Comparison: CT of the abdomen and pelvis performed 11/16/2009  Findings: There is no evidence of fracture or dislocation.  Both femoral heads are seated normally within their respective acetabula.  Degenerative change is noted along the visualized lumbar spine.  The sacroiliac joints are unremarkable in appearance.  The visualized bowel gas pattern is grossly unremarkable in appearance.  IMPRESSION: No evidence of fracture or dislocation.   Original Report Authenticated By: Tonia Ghent, M.D.   Ct Head Wo Contrast  07/14/2013   CLINICAL DATA:  Increased hallucinations.  EXAM: CT HEAD WITHOUT CONTRAST  TECHNIQUE: Contiguous axial images were obtained from the base of the skull through the vertex without intravenous contrast.  COMPARISON:  07/08/2013.  FINDINGS: Stable enlarged ventricles and subarachnoid spaces. No intracranial hemorrhage, mass lesion  or CT evidence of acute infarction. Marked left maxillary sinus mucosal thickening/retention cyst formation with improvement. Decreased right maxillary sinus mucosal thickening/retention cyst with minimal residual. Unremarkable bones.  IMPRESSION: 1. No acute abnormality. 2. Stable atrophy. 3. Improving bilateral chronic maxillary sinusitis.   Electronically Signed   By: Gordan Payment M.D.   On: 07/14/2013 20:09   Ct Head Wo Contrast  07/08/2013   *RADIOLOGY REPORT*  Clinical Data: Fall, altered mental status.  The  CT HEAD WITHOUT CONTRAST  Technique:  Contiguous axial images were obtained from the base of the skull through the vertex without contrast.  Comparison: None available at time of study interpretation.  Findings: The ventricles and sulci are normal for age.  No intraparenchymal hemorrhage, mass effect nor midline shift.  Patchy supratentorial white matter hypodensities are within normal range for patient's age and though non-specific suggest sequalae of chronic small vessel ischemic disease. No acute large vascular territory infarcts.  No abnormal extra-axial fluid collections.  Basal cisterns are patent.  Severe calcific atheroma atherosclerosis of the carotid siphons, to a lesser extent vertebral arteries.  No skull fracture.  Visualized paranasal sinuses and mastoid aircells are well-aerated. Status post bilateral ocular lens implants.  Moderate left maxillary sinusitis without paranasal sinus air-fluid levels, trace ethmoid mucosal thickening. 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  IMPRESSION: No acute intracranial process.  Moderate left maxillary sinusitis .  Involutional changes, mild to moderate white matter changes suggestive chronic small vessel ischemic disease.   Original Report Authenticated By: Awilda Metro   Dg Chest Port 1 View  07/14/2013   CLINICAL DATA:  Altered mental status. Hypertension. Ex-smoker.  EXAM:  PORTABLE CHEST - 1 VIEW  COMPARISON:  07/08/2013.  FINDINGS: The heart remains normal in size and the lungs are clear. Stable mild scoliosis.  IMPRESSION: No acute abnormality.   Electronically Signed   By: Gordan Payment M.D.   On: 07/14/2013 18:42   Dg Chest Port 1 View  (if Code Sepsis Called)  07/08/2013   *RADIOLOGY REPORT*  Clinical Data: Fall, altered  mental status.  PORTABLE CHEST - 1 VIEW  Comparison: Chest radiograph November 01, 2009  Findings: The cardiomediastinal silhouette is non suspicious, mild calcific atherosclerosis of the aortic knob as previously seen. The lungs are clear without pleural effusions or focal consolidations.  The pulmonary vasculature is unremarkable. Trachea projects midline and there is no pneumothorax.  The included soft tissue planes and osseous structures are unremarkable.  Multiple EKG lines overlay the patient and could obscure underlying subtle pathology.  IMPRESSION: No acute cardiopulmonary process.  Improved aeration of the lungs from November 01, 2009.   Original Report Authenticated By: Awilda Metro    Microbiology: Recent Results (from the past 240 hour(s))  CULTURE, BLOOD (ROUTINE X 2)     Status: None   Collection Time    07/14/13  6:50 PM      Result Value Range Status   Specimen Description BLOOD WRIST RIGHT   Final   Special Requests     Final   Value: BOTTLES DRAWN AEROBIC AND ANAEROBIC 5CC RED 6CCBLUE   Culture  Setup Time     Final   Value: 07/15/2013 01:28     Performed at [facility]   Culture     Final   Value:        BLOOD CULTURE RECEIVED NO GROWTH TO DATE CULTURE WILL BE HELD FOR 5 DAYS BEFORE ISSUING A FINAL NEGATIVE REPORT     Performed at [facility]   Report Status PENDING   Incomplete  CULTURE, BLOOD (ROUTINE X 2)     Status: None   Collection Time    07/14/13  7:20 PM      Result Value Range Status   Specimen Description BLOOD ARM RIGHT   Final   Special Requests BOTTLES DRAWN AEROBIC ONLY 10CC   Final    Culture  Setup Time     Final   Value: 07/15/2013 01:27     Performed at [facility]   Culture     Final   Value:        BLOOD CULTURE RECEIVED NO GROWTH TO DATE CULTURE WILL BE HELD FOR 5 DAYS BEFORE ISSUING A FINAL NEGATIVE REPORT     Performed at [facility]   Report Status PENDING   Incomplete     Labs: Basic Metabolic Panel:  Recent Labs Lab 07/14/13 1810 07/14/13 2205 07/15/13 0500 07/16/13 0500 07/17/13 0520 07/18/13 0730  NA 141  --  144 144 138  --   K 3.2*  --  3.8 3.6 3.4* 4.1  CL 100  --  108 108 106  --   CO2 26  --  23 19 24   --   GLUCOSE 86  --  120* 107* 161*  --   BUN 50*  --  42* 33* 30*  --   CREATININE 1.78*  --  1.35 1.12 0.90  --   CALCIUM 9.4  --  8.4 8.8 8.2*  --   MG  --  1.6  --   --  1.9  --    Liver Function Tests:  Recent Labs Lab 07/14/13 1810  AST 50*  ALT 6  ALKPHOS 96  BILITOT 0.7  PROT 7.3  ALBUMIN 3.1*    Recent Labs Lab 07/14/13 1810  LIPASE 23   No results found for this basename: AMMONIA,  in the last 168 hours CBC:  Recent Labs Lab 07/14/13 1810 07/15/13 0500  WBC 9.6 7.3  NEUTROABS 6.7  --  HGB 12.1* 10.7*  HCT 36.0* 31.2*  MCV 89.8 89.7  PLT 276 232   Cardiac Enzymes:  Recent Labs Lab 07/14/13 1810 07/14/13 2205 07/15/13 0500  CKTOTAL  --  636*  --   TROPONINI <0.30 <0.30 <0.30   BNP: BNP (last 3 results)  Recent Labs  08/02/12 1634 07/14/13 1810  PROBNP 78.0 3907.0*   CBG:  Recent Labs Lab 07/17/13 1136 07/17/13 1630 07/17/13 2140 07/18/13 0800 07/18/13 1139  GLUCAP 189* 191* 173* 143* 167*       Signed:  Chisa Kushner  [facility] 07/18/2013, 11:57 AM

## 2013-07-18 NOTE — Evaluation (Signed)
Occupational Therapy Evaluation Patient Details Name: Justin Lane MRN: [id number] DOB: 29-Aug-1930 Today's Date: 07/18/2013 Time: 4098-1191 OT Time Calculation (min): 13 min  OT Assessment / Plan / Recommendation History of present illness 79 year old male with a history of stage V Parkinson's disease, atrial fibrillation, diabetes mellitus, hypothyroidism, hyperlipidemia, hypertension, CAD, CKD stage III presents with five-day history of increasing confusion and hallucinations   Clinical Impression   Pt presents w/ dx as per above limiting his independence w/ ADL's and functional mobility/transfers related to ADL's. Pt should benefit from acute OT followed by SNF rehab to assist in maximizing independence w/ ADL's/transfers and eventual return home w/ 24/7 PRN assist.    OT Assessment  Patient needs continued OT Services    Follow Up Recommendations  SNF    Barriers to Discharge      Equipment Recommendations  None recommended by OT    Recommendations for Other Services    Frequency  Min 2X/week    Precautions / Restrictions Precautions Precautions: Fall;Other (comment) (DNR) Restrictions Weight Bearing Restrictions: No   Pertinent Vitals/Pain No c/o    ADL  Grooming: Simulated;Set up;Supervision/safety Where Assessed - Grooming: Unsupported sitting Upper Body Bathing: Simulated;Minimal assistance Where Assessed - Upper Body Bathing: Unsupported sitting Lower Body Bathing: Simulated;Maximal assistance Where Assessed - Lower Body Bathing: Supported sit to stand;Supported sitting Upper Body Dressing: Performed;Minimal assistance Where Assessed - Upper Body Dressing: Supported sitting Lower Body Dressing: Performed;Moderate assistance Where Assessed - Lower Body Dressing: Supported sitting Toilet Transfer: Simulated;Moderate assistance Toilet Transfer Method: Stand pivot Acupuncturist: Materials engineer and Hygiene:  Simulated;Maximal assistance Where Assessed - Engineer, mining and Hygiene: Sit to stand from 3-in-1 or toilet Tub/Shower Transfer Method: Not assessed Equipment Used: Gait belt;Rolling walker Transfers/Ambulation Related to ADLs: Pt was Min-mod assist sit to stand from EOB/chair, followed by +2 functional mobility in room to recliner (second person for safety, narrow BOS noted and vc's for positioning and RW safety) ADL Comments: Pt performed LB dressing tasks today sitting EOB for donning socks. Pt sat EOB w/ 1-2 UE support for approximately 10 min. Functional mobility and transfers in room w/ PT +2 for safety secondary to narrow BOS and need for VC's safety.    OT Diagnosis: Generalized weakness  OT Problem List: Decreased activity tolerance;Impaired balance (sitting and/or standing);Decreased knowledge of use of DME or AE;Decreased strength OT Treatment Interventions: Self-care/ADL training;Therapeutic exercise;Energy conservation;DME and/or AE instruction;Patient/family education;Therapeutic activities;Balance training   OT Goals(Current goals can be found in the care plan section) Acute Rehab OT Goals Patient Stated Goal: Increased independence and return home when able Time For Goal Achievement: 08/01/13 Potential to Achieve Goals: Good  Visit Information  Last OT Received On: 07/18/13 Assistance Needed: +2 PT/OT Co-Evaluation/Treatment: Yes History of Present Illness: 79 year old male with a history of stage V Parkinson's disease, atrial fibrillation, diabetes mellitus, hypothyroidism, hyperlipidemia, hypertension, CAD, CKD stage III presents with five-day history of increasing confusion and hallucinations       Prior Functioning     Home Living Family/patient expects to be discharged to:: Private residence Living Arrangements: Spouse/significant other Available Help at Discharge: Family Type of Home: House Home Access: Ramped entrance Home Layout: One  level Home Equipment: Environmental consultant - 2 wheels;Bedside commode;Wheelchair - Lawyer Comments: per wife and granddaughter, patient was ambulatory until about a week ago and has been dependent for transfers since that time Prior Function Level of Independence: Independent with assistive device(s) Communication Communication: No difficulties Dominant  Hand: Right   Vision/Perception  NT   Cognition  Cognition Arousal/Alertness: Awake/alert Behavior During Therapy: WFL for tasks assessed/performed Overall Cognitive Status: Within Functional Limits for tasks assessed    Extremity/Trunk Assessment Upper Extremity Assessment Upper Extremity Assessment: Generalized weakness Lower Extremity Assessment Lower Extremity Assessment: Generalized weakness    Mobility Bed Mobility Bed Mobility: Supine to Sit;Sitting - Scoot to Edge of Bed Supine to Sit: 4: Min assist;HOB elevated;With rails Sitting - Scoot to Edge of Bed: 4: Min assist Details for Bed Mobility Assistance: Assist to bring trunk up and hips to EOB. Transfers Transfers: Sit to Stand;Stand to Sit Sit to Stand: 4: Min assist;3: Mod assist;With upper extremity assist;From bed;From chair/3-in-1 Stand to Sit: 4: Min assist;With upper extremity assist;With armrests Details for Transfer Assistance: Verbal cues for hand placement.  Min A from bed and mod A from chair that didn't have armrests.  Assist to bring hips up. Second person for safety secondary to narrow base of stance.        Balance Balance Balance Assessed: Yes Static Sitting Balance Static Sitting - Balance Support: Feet supported (Pt sitting w/ 1-2 UE supported, 1 during ADL tasks) Static Sitting - Level of Assistance: 5: Stand by assistance;6: Modified independent (Device/Increase time) Static Sitting - Comment/# of Minutes: 10 min during LB dressing (don socks), crossing one LE over other knee, increased time. Static Standing Balance Static Standing -  Balance Support: Bilateral upper extremity supported Static Standing - Level of Assistance: 4: Min assist   End of Session OT - End of Session Equipment Utilized During Treatment: Gait belt;Rolling walker Activity Tolerance: Patient tolerated treatment well Patient left: in chair;with call bell/phone within reach;with family/visitor present  GO     Charletta Cousin, Amy Beth Dixon 07/18/2013, 10:55 AM

## 2013-07-18 NOTE — Clinical Social Work Note (Signed)
Ambulance transport requested for 4:00 PM. Family and RN notified.   Roddie Mc, [place], [place], [phone]

## 2013-07-18 NOTE — Clinical Social Work Placement (Signed)
Clinical Social Work Department CLINICAL SOCIAL WORK PLACEMENT NOTE 07/18/2013  Patient:  Justin Lane, Justin Lane  Account Number:  [id number] Admit date:  07/14/2013  Clinical Social Worker:  Cherre Blanc, [place]  Date/time:  07/17/2013 03:36 PM  Clinical Social Work is seeking post-discharge placement for this patient at the following level of care:   SKILLED NURSING   (*CSW will update this form in Epic as items are completed)   07/17/2013  Patient/family provided with [facility] Department of Clinical Social Work's list of facilities offering this level of care within the geographic area requested by the patient (or if unable, by the patient's family).  07/17/2013  Patient/family informed of their freedom to choose among providers that offer the needed level of care, that participate in Medicare, Medicaid or managed care program needed by the patient, have an available bed and are willing to accept the patient.  07/17/2013  Patient/family informed of [facility]' ownership interest in [facility], as well as of the fact that they are under no obligation to receive care at this facility.  PASARR submitted to EDS on 07/17/2013 PASARR number received from EDS on 07/17/2013  FL2 transmitted to all facilities in geographic area requested by pt/family on  07/17/2013 FL2 transmitted to all facilities within larger geographic area on   Patient informed that his/her managed care company has contracts with or will negotiate with  certain facilities, including the following:     Patient/family informed of bed offers received:  07/18/2013 Patient chooses bed at [facility] Physician recommends and patient chooses bed at    Patient to be transferred to [facility] on  07/18/2013 Patient to be transferred to facility by Ambulance  The following physician request were entered in Epic:   Additional Comments: Per MD patient ready for DC 07/18/13. Patient DC to [facility]  07/18/13. Patient transported to facility by ambulance. RN, family, and facility notified of DC. Patient's packet left with shadow chart. Daughter is completing paperwork at facility. CSW signing off at this time.   Roddie Mc, [place], [place], [phone]

## 2013-07-18 NOTE — Progress Notes (Signed)
Justin Lane to be D/C'd Skilled nursing facility [facility] per MD order.  Discussed with the patient and all questions fully answered.    Medication List    STOP taking these medications       digoxin 0.125 MG tablet  Commonly known as:  LANOXIN     furosemide 40 MG tablet  Commonly known as:  LASIX     levofloxacin 500 MG tablet  Commonly known as:  LEVAQUIN     oxymetazoline 0.05 % nasal spray  Commonly known as:  AFRIN NASAL SPRAY     RA IRON 27 MG Tabs  Generic drug:  Ferrous Sulfate      TAKE these medications       amoxicillin-clavulanate 500-125 MG per tablet  Commonly known as:  AUGMENTIN  Take 1 tablet (500 mg total) by mouth every 12 (twelve) hours.     aspirin 81 MG tablet  Take 81 mg by mouth daily.     atorvastatin 80 MG tablet  Commonly known as:  LIPITOR  take 1 tablet by mouth every evening     AVODART 0.5 MG capsule  Generic drug:  dutasteride  take 1 capsule by mouth once daily     carbidopa-levodopa 25-100 MG per tablet  Commonly known as:  SINEMET IR  Take 1 tablet by mouth 3 (three) times daily. 1 tab at 8am, 1 pm, 6pm     carbidopa-levodopa 50-200 MG per tablet  Commonly known as:  SINEMET CR  Take 1 tablet by mouth at bedtime.     Cholecalciferol 1000 UNITS capsule  Take 1,000 Units by mouth daily.     cilostazol 100 MG tablet  Commonly known as:  PLETAL  take 1 tablet by mouth twice a day     cyanocobalamin 500 MCG tablet  Take 1,000 mcg by mouth daily.     docusate sodium 100 MG capsule  Commonly known as:  COLACE  Take 1 capsule (100 mg total) by mouth daily.     donepezil 10 MG tablet  Commonly known as:  ARICEPT  Take 1 tablet (10 mg total) by mouth daily.     feeding supplement (ENSURE COMPLETE) Liqd  Take 237 mLs by mouth 2 (two) times daily between meals.     HYDROcodone-acetaminophen 5-325 MG per tablet  Commonly known as:  NORCO/VICODIN  Take 0.5-1 tablets by mouth every 6 (six) hours as needed.     insulin aspart 100 UNIT/ML injection  Commonly known as:  novoLOG  Inject 0-9 Units into the skin 3 (three) times daily with meals.     levothyroxine 25 MCG tablet  Commonly known as:  SYNTHROID, LEVOTHROID  TAKE 1 TABLET BY MOUTH DAILY     metoprolol tartrate 12.5 mg Tabs tablet  Commonly known as:  LOPRESSOR  Take 0.5 tablets (12.5 mg total) by mouth every 6 (six) hours.     mometasone 50 MCG/ACT nasal spray  Commonly known as:  NASONEX  Place 2 sprays into the nose daily.     nitroGLYCERIN 0.3 MG SL tablet  Commonly known as:  NITROSTAT  Place 1 tablet (0.3 mg total) under the tongue every 5 (five) minutes as needed.     potassium chloride 10 MEQ tablet  Commonly known as:  K-DUR,KLOR-CON  Take 1 tablet (10 mEq total) by mouth once.     QUEtiapine 50 MG tablet  Commonly known as:  SEROQUEL  Take 1 tablet (50 mg total) by mouth at bedtime.  VVS, Skin clean, dry and intact without evidence of skin break down, no evidence of skin tears noted. IV catheter discontinued intact. Site without signs and symptoms of complications. Dressing and pressure applied.  An After Visit Summary was printed and given to the patient. Follow up appointments , new prescriptions and medication administration times given to nurse caring for pt at [facility] Patient escorted via Sharin Mons, and D/C home to [facility]  Cindra Eves, RN 07/18/2013 6:03 PM

## 2013-07-20 ENCOUNTER — Ambulatory Visit: Payer: Medicare Other

## 2013-07-21 ENCOUNTER — Non-Acute Institutional Stay (SKILLED_NURSING_FACILITY): Payer: Medicare Other | Admitting: Internal Medicine

## 2013-07-21 ENCOUNTER — Encounter: Payer: Self-pay | Admitting: Internal Medicine

## 2013-07-21 DIAGNOSIS — G20A1 Parkinson's disease without dyskinesia, without mention of fluctuations: Secondary | ICD-10-CM

## 2013-07-21 DIAGNOSIS — J019 Acute sinusitis, unspecified: Secondary | ICD-10-CM

## 2013-07-21 DIAGNOSIS — I739 Peripheral vascular disease, unspecified: Secondary | ICD-10-CM

## 2013-07-21 DIAGNOSIS — F329 Major depressive disorder, single episode, unspecified: Secondary | ICD-10-CM | POA: Insufficient documentation

## 2013-07-21 DIAGNOSIS — R5381 Other malaise: Secondary | ICD-10-CM

## 2013-07-21 DIAGNOSIS — L89109 Pressure ulcer of unspecified part of back, unspecified stage: Secondary | ICD-10-CM

## 2013-07-21 DIAGNOSIS — I4891 Unspecified atrial fibrillation: Secondary | ICD-10-CM

## 2013-07-21 DIAGNOSIS — F32A Depression, unspecified: Secondary | ICD-10-CM

## 2013-07-21 DIAGNOSIS — L8995 Pressure ulcer of unspecified site, unstageable: Secondary | ICD-10-CM

## 2013-07-21 DIAGNOSIS — I2510 Atherosclerotic heart disease of native coronary artery without angina pectoris: Secondary | ICD-10-CM

## 2013-07-21 DIAGNOSIS — I798 Other disorders of arteries, arterioles and capillaries in diseases classified elsewhere: Secondary | ICD-10-CM

## 2013-07-21 DIAGNOSIS — G20 Parkinson's disease: Secondary | ICD-10-CM

## 2013-07-21 DIAGNOSIS — L89150 Pressure ulcer of sacral region, unstageable: Secondary | ICD-10-CM

## 2013-07-21 DIAGNOSIS — E1351 Other specified diabetes mellitus with diabetic peripheral angiopathy without gangrene: Secondary | ICD-10-CM

## 2013-07-21 LAB — CULTURE, BLOOD (ROUTINE X 2)

## 2013-07-21 NOTE — Progress Notes (Signed)
Patient ID: Justin Lane, male   DOB: 08-29-1930, 79 y.o.   MRN: [id number]  [facility]  Chief Complaint  Patient presents with  . Hospitalization Follow-up    new admit   Allergies  Allergen Reactions  . Codeine Sulfate     REACTION: nausea and vomiting   Code status- DNR  HPI 79 y/o male patient is here for STR post hospital admission with altered mental status. He has hx of afib, dm type 2, HTN, CAD among others. He was recently started on levaquin for sinusitis and there were concerns avbout this causing his confusion and vision problem. It was stopped and he was started on augmentin. Infectious workup was negative in the hospital. Neurology was consulted and his delirium was thought to be from progressive parkinsons. He was started on seroquel. During his stay in hospital, he also had non sustained v.tach and cardiolgoy was consulted. He was also given iv fluid due to dehydration. He returned to baseline with his mental status but was deconditioned and thus sent to SNF for STR. He was seen in his room today with his wife present. He has been working with Pt and OT team. His appetite has improved. No recent episode of confusion. No falls reported. He still feels weak but feels his strength has somewhat improved since admission. Denies any complaints this visit.   Review of Systems  Constitutional: Negative for fever, chills and weight loss.  HENT: Negative for congestion.   Eyes: Negative for blurred vision.  Respiratory: Negative for cough, shortness of breath and wheezing.   Cardiovascular: Negative for chest pain, palpitations and leg swelling.  Gastrointestinal: Negative for heartburn, nausea, vomiting, abdominal pain and diarrhea.  Genitourinary: Negative for dysuria.  Musculoskeletal: Negative for falls and myalgias.  Skin: Negative for rash.  Neurological: Positive for weakness. Negative for dizziness, focal weakness, seizures, loss of consciousness and headaches.   Psychiatric/Behavioral: Negative for depression.     Past Medical History  Diagnosis Date  . GERD (gastroesophageal reflux disease)   . Hypothyroidism   . Anemia     Mild  . Hypertension   . Diabetes mellitus     Goal A1c ~8 due to concern for hypoglycemia  . Hyperlipidemia   . PVD (peripheral vascular disease)     Status post bilateral renal stenting, LE's  . Paroxysmal atrial fibrillation     On Coumadin and rate control meds  . CAD (coronary artery disease)     a. Inf MI 1997 tx with POBA;  b. s/p BMS to RCA for restonosis;  b. NSTEMI 9/04 => LHC 06/22/03:  LM 30%, LAD and CFX irregs, mRCA 80% ISR, EF 60% => PCI: Taxus DES to mRCA  . Hemorrhoids   . Gastritis 06/24/03 & 12/08/06    EGD Christella Hartigan)  . Hiatal hernia   . Gout   . Vitamin B 12 deficiency   . BPH (benign prostatic hyperplasia)   . Diverticular disease     via ACBE  . Herniated disc     L4, L5  . Near syncope 10/07/09  . Parkinson's disease     Per Dr. Terrace Arabia  . Chronic kidney disease     Chronic, with a creatinine in the range of 1.2-1.6;  hx of ARF 11/2009  . Renal calculus     Recurrent lithotripsy  . Hx of echocardiogram     a. Echo 10/2009:  mild LVH, EF 55-60%, b.  Echo 11/13:  mild LVH, EF 70-75%, mid cavity dynamic  obstruction wth Valsalva (pk velocity 307 cm/sec, pk gradient 38 mmHg), mild MS, mean gradient 7 mmHg   Past Surgical History  Procedure Laterality Date  . Angioplasty  10/97    Multiple   . Lithotripsy  04/29/00    Due to renal lithiasis   . Cardiac catheterization  06/22/03    and PICA for in-stent restenosis  . Iliac vein angioplasty / stenting  05/16/04    Bilateral  . Laminectomy  09/28/07    L4,L5  (Dr. Ophelia Charter)  . Cholecystectomy open  10/23/2009    Attempted lap, purulent GB (Dr. Dwain Sarna)  . Cataract extraction      right eye 2013   Social History:   reports that he quit smoking about 26 years ago. He has never used smokeless tobacco. He reports that he does not drink alcohol or  use illicit drugs.  Family History  Problem Relation Age of Onset  . Heart disease Mother   . Cancer Father     ? Skin  . Diabetes Sister   . Heart disease Brother   . Heart disease Brother     Medications: Patient's Medications  New Prescriptions   No medications on file  Previous Medications   AMOXICILLIN-CLAVULANATE (AUGMENTIN) 500-125 MG PER TABLET    Take 1 tablet (500 mg total) by mouth every 12 (twelve) hours.   ASPIRIN 81 MG TABLET    Take 81 mg by mouth daily.    ATORVASTATIN (LIPITOR) 80 MG TABLET    take 1 tablet by mouth every evening   AVODART 0.5 MG CAPSULE    take 1 capsule by mouth once daily   CARBIDOPA-LEVODOPA (SINEMET CR) 50-200 MG PER TABLET    Take 1 tablet by mouth at bedtime.   CARBIDOPA-LEVODOPA (SINEMET IR) 25-100 MG PER TABLET    Take 1 tablet by mouth 3 (three) times daily. 1 tab at 8am, 1 pm, 6pm   CHOLECALCIFEROL (VITAMIN D3) 1000 UNITS CAPSULE    Take 1,000 Units by mouth daily.     CILOSTAZOL (PLETAL) 100 MG TABLET    take 1 tablet by mouth twice a day   CYANOCOBALAMIN 500 MCG TABLET    Take 1,000 mcg by mouth daily.    DOCUSATE SODIUM (COLACE) 100 MG CAPSULE    Take 1 capsule (100 mg total) by mouth daily.   DONEPEZIL (ARICEPT) 10 MG TABLET    Take 1 tablet (10 mg total) by mouth daily.   FEEDING SUPPLEMENT, ENSURE COMPLETE, (ENSURE COMPLETE) LIQD    Take 237 mLs by mouth 2 (two) times daily between meals.   HYDROCODONE-ACETAMINOPHEN (NORCO/VICODIN) 5-325 MG PER TABLET    Take 0.5-1 tablets by mouth every 6 (six) hours as needed.   INSULIN ASPART (NOVOLOG) 100 UNIT/ML INJECTION    Inject 0-9 Units into the skin 3 (three) times daily with meals.   LEVOTHYROXINE (SYNTHROID, LEVOTHROID) 25 MCG TABLET    TAKE 1 TABLET BY MOUTH DAILY   METOPROLOL TARTRATE (LOPRESSOR) 12.5 MG TABS TABLET    Take 0.5 tablets (12.5 mg total) by mouth every 6 (six) hours.   MOMETASONE (NASONEX) 50 MCG/ACT NASAL SPRAY    Place 2 sprays into the nose daily.   NITROGLYCERIN  (NITROSTAT) 0.3 MG SL TABLET    Place 1 tablet (0.3 mg total) under the tongue every 5 (five) minutes as needed.   POTASSIUM CHLORIDE (K-DUR,KLOR-CON) 10 MEQ TABLET    Take 1 tablet (10 mEq total) by mouth once.   QUETIAPINE (SEROQUEL) 50 MG  TABLET    Take 1 tablet (50 mg total) by mouth at bedtime.  Modified Medications   No medications on file  Discontinued Medications   No medications on file     Physical Exam: Filed Vitals:   07/21/13 1455  BP: 124/60  Pulse: 74  Temp: 97.4 F (36.3 C)  Resp: 18  Height: 6' (1.829 m)  Weight: 167 lb (75.751 kg)  SpO2: 96%   General: elderly, frail male in NAD  HEENT: MMM, PERRLA, EOMI, no pallor, no LAD, no JVD Cardiovascular: SI, s2 present, no rubs, murmurs or gallops. Irregularly irregular, trace b/l pedal edema Respiratory: Clear to auscultation bilaterally, no wheeze or rhonchi or crackles Abdomen: Soft, bowel sounds present, non tender Extremities: able to move all 4, weakness noted in lower extremities, uses a wheelchair Skin: warm and dry, chronic venous changes in lower extremity. Has unstable wound in buttock area with 1005 slough covered Neuro: AAOx3, cranial nerves grossly intact.   Psych: Normal affect and mood  Labs reviewed: Basic Metabolic Panel:  Recent Labs  09/81/19 2205 07/15/13 0500 07/16/13 0500 07/17/13 0520 07/18/13 0730  NA  --  144 144 138  --   K  --  3.8 3.6 3.4* 4.1  CL  --  108 108 106  --   CO2  --  23 19 24   --   GLUCOSE  --  120* 107* 161*  --   BUN  --  42* 33* 30*  --   CREATININE  --  1.35 1.12 0.90  --   CALCIUM  --  8.4 8.8 8.2*  --   MG 1.6  --   --  1.9  --    Liver Function Tests:  Recent Labs  12/22/12 1153 07/08/13 0137 07/14/13 1810  AST 19 20 50*  ALT 8 7 6   ALKPHOS 64 92 96  BILITOT 0.9 0.9 0.7  PROT 6.9 6.8 7.3  ALBUMIN 3.5 3.2* 3.1*    Recent Labs  07/14/13 1810  LIPASE 23   No results found for this basename: AMMONIA,  in the last 8760 hours CBC:  Recent  Labs  08/02/12 1634 07/08/13 0137 07/14/13 1810 07/15/13 0500  WBC 8.7 8.6 9.6 7.3  NEUTROABS 6.0 6.0 6.7  --   HGB 13.3 11.9* 12.1* 10.7*  HCT 39.4 34.2* 36.0* 31.2*  MCV 96.5 90.2 89.8 89.7  PLT 251.0 199 276 232   Cardiac Enzymes:  Recent Labs  07/14/13 1810 07/14/13 2205 07/15/13 0500  CKTOTAL  --  636*  --   TROPONINI <0.30 <0.30 <0.30   CBG:  Recent Labs  07/17/13 2140 07/18/13 0800 07/18/13 1139  GLUCAP 173* 143* 167*    Radiological Exams: Dg Hip Bilateral W/pelvis  07/15/2013   *RADIOLOGY REPORT*  Clinical Data: Bilateral hip pain; recent fall.  BILATERAL HIP WITH PELVIS - 4+ VIEW  Comparison: CT of the abdomen and pelvis performed 11/16/2009  Findings: There is no evidence of fracture or dislocation.  Both femoral heads are seated normally within their respective acetabula.  Degenerative change is noted along the visualized lumbar spine.  The sacroiliac joints are unremarkable in appearance.  The visualized bowel gas pattern is grossly unremarkable in appearance.  IMPRESSION: No evidence of fracture or dislocation.   Original Report Authenticated By: Tonia Ghent, M.D.   Ct Head Wo Contrast  07/14/2013   CLINICAL DATA:  Increased hallucinations.  EXAM: CT HEAD WITHOUT CONTRAST  TECHNIQUE: Contiguous axial images were obtained from the base of  the skull through the vertex without intravenous contrast.  COMPARISON:  07/08/2013.  FINDINGS: Stable enlarged ventricles and subarachnoid spaces. No intracranial hemorrhage, mass lesion or CT evidence of acute infarction. Marked left maxillary sinus mucosal thickening/retention cyst formation with improvement. Decreased right maxillary sinus mucosal thickening/retention cyst with minimal residual. Unremarkable bones.  IMPRESSION: 1. No acute abnormality. 2. Stable atrophy. 3. Improving bilateral chronic maxillary sinusitis.   Electronically Signed   By: Gordan Payment M.D.   On: 07/14/2013 20:09   Ct Head Wo  Contrast  07/08/2013   *RADIOLOGY REPORT*  Clinical Data: Fall, altered mental status.  The  CT HEAD WITHOUT CONTRAST  Technique:  Contiguous axial images were obtained from the base of the skull through the vertex without contrast.  Comparison: None available at time of study interpretation.  Findings: The ventricles and sulci are normal for age.  No intraparenchymal hemorrhage, mass effect nor midline shift.  Patchy supratentorial white matter hypodensities are within normal range for patient's age and though non-specific suggest sequalae of chronic small vessel ischemic disease. No acute large vascular territory infarcts.  No abnormal extra-axial fluid collections.  Basal cisterns are patent.  Severe calcific atheroma atherosclerosis of the carotid siphons, to a lesser extent vertebral arteries.  No skull fracture.  Visualized paranasal sinuses and mastoid aircells are well-aerated. Status post bilateral ocular lens implants.  Moderate left maxillary sinusitis without paranasal sinus air-fluid levels, trace ethmoid mucosal thickening. 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  IMPRESSION: No acute intracranial process.  Moderate left maxillary sinusitis .  Involutional changes, mild to moderate white matter changes suggestive chronic small vessel ischemic disease.   Original Report Authenticated By: Awilda Metro   Dg Chest Port 1 View  07/14/2013   CLINICAL DATA:  Altered mental status. Hypertension. Ex-smoker.  EXAM: PORTABLE CHEST - 1 VIEW  COMPARISON:  07/08/2013.  FINDINGS: The heart remains normal in size and the lungs are clear. Stable mild scoliosis.  IMPRESSION: No acute abnormality.   Electronically Signed   By: Gordan Payment M.D.   On: 07/14/2013 18:42   Dg Chest Port 1 View  (if Code Sepsis Called)  07/08/2013   *RADIOLOGY REPORT*  Clinical Data: Fall, altered mental status.  PORTABLE CHEST - 1 VIEW  Comparison: Chest radiograph  November 01, 2009  Findings: The cardiomediastinal silhouette is non suspicious, mild calcific atherosclerosis of the aortic knob as previously seen. The lungs are clear without pleural effusions or focal consolidations.  The pulmonary vasculature is unremarkable. Trachea projects midline and there is no pneumothorax.  The included soft tissue planes and osseous structures are unremarkable.  Multiple EKG lines overlay the patient and could obscure underlying subtle pathology.  IMPRESSION: No acute cardiopulmonary process.  Improved aeration of the lungs from November 01, 2009.   Original Report Authenticated By: Awilda Metro   Procedures: Echocardiogram   Study Conclusions  - Left ventricle: The cavity size was normal. Wall thickness was normal. Systolic function was hyperdynamic. The estimated ejection fraction was in the range of 75% to 80%. There was no dynamic obstruction. Although no diagnostic regional wall motion abnormality was identified, this possibility cannot be completely excluded on the basis of this study. There was an increased relative contribution of atrial contraction to ventricular filling, which may be due to aging or hypovolemia. The study is not technically sufficient to allow evaluation of LV diastolic function. - Mitral valve: Moderately to severely calcified annulus. - Right ventricle:  Systolic function was hyperdynamic. - Atrial septum: No defect or patent foramen ovale was identified.  Assessment/Plan  Physical Deconditioning- in setting of progression of parkinson, recent dehydration and infection. Continue working with PT and OT, goal is to return home. Appetite has improved. Fall precautions. Skin care  Sinusitis- complete course of augmentin. Remains afebrile. Monitor wbc and temp curve  Parkinson disease- his tremors have subsided with using sinemet. Continue current regimen  Sacral ressure ulcer- skin care, air mattress, pressure sore prophylaxis,  heel floaters  PAD- continue cilostazol for now  CAD- remians chest pain free. Continue aspirin,b blocker, prn NTG and statin  Dm type 2 with vascular complications- continue SSI, monitor cbg  Depression- seroquel has been helpful, patient is interactive and mood is stable, continue this for now  afib- rate controlled. Continue metoprolol. Not a coumadin candidate due to fall risk. Continue aspirin  Family/ staff Communication: reviewed care plan with nursing supervisor , patient and his wife. Questions were answered. Spent total 60 minutes in patient care  Labs/tests ordered- cbc, cmp

## 2013-07-31 ENCOUNTER — Non-Acute Institutional Stay (SKILLED_NURSING_FACILITY): Payer: Medicare Other | Admitting: Nurse Practitioner

## 2013-07-31 ENCOUNTER — Non-Acute Institutional Stay: Payer: Medicare Other | Admitting: Nurse Practitioner

## 2013-07-31 DIAGNOSIS — R600 Localized edema: Secondary | ICD-10-CM

## 2013-07-31 DIAGNOSIS — J810 Acute pulmonary edema: Secondary | ICD-10-CM

## 2013-07-31 DIAGNOSIS — E039 Hypothyroidism, unspecified: Secondary | ICD-10-CM

## 2013-07-31 DIAGNOSIS — I10 Essential (primary) hypertension: Secondary | ICD-10-CM

## 2013-07-31 DIAGNOSIS — G20 Parkinson's disease: Secondary | ICD-10-CM

## 2013-07-31 DIAGNOSIS — R5381 Other malaise: Secondary | ICD-10-CM

## 2013-07-31 DIAGNOSIS — E1351 Other specified diabetes mellitus with diabetic peripheral angiopathy without gangrene: Secondary | ICD-10-CM

## 2013-07-31 DIAGNOSIS — R609 Edema, unspecified: Secondary | ICD-10-CM

## 2013-07-31 DIAGNOSIS — I798 Other disorders of arteries, arterioles and capillaries in diseases classified elsewhere: Secondary | ICD-10-CM

## 2013-07-31 DIAGNOSIS — G20A1 Parkinson's disease without dyskinesia, without mention of fluctuations: Secondary | ICD-10-CM

## 2013-07-31 DIAGNOSIS — I739 Peripheral vascular disease, unspecified: Secondary | ICD-10-CM

## 2013-07-31 DIAGNOSIS — R531 Weakness: Secondary | ICD-10-CM

## 2013-07-31 NOTE — Progress Notes (Signed)
July 31, 2013 Justin Lane, DOB:  [birth year] [address], RM#:  [id number] Code Status:  DNR  Chief complaint: Report from nursing staff regarding severely edematous feet.  History of present illness, the patient is an 79 year old Caucasian male who this at [facility] for rehabilitation specifically addressing generalized weakness and Parkinson's disease. He has a long history of peripheral arterial disease and sits frequently with his legs hanging down. He has pain if he sits with his legs elevated.  Past medical history: Significant for coronary artery disease and peripheral arterial disease, status post stenting in 1994.  Patient's medications are significant that he is on metoprolol or Lopressor 25 mg one half tablet 4 times a day. Also significant he is on Synthroid 25 mcg per day. Otherwise there is really no changes in his medication.  Vital signs: Blood pressure 157/75 Temperature 90.8 Pulse 91 Respiratory rate 22 Oximetry 95%.  Most recent labs include, from 07/20/2013, CBC: WBC 5.7 RBC 3.5 Hgb 10.9 HCT 33.6 MCB 95.5 MCH 31 MCHC 32.4 RDW 14.2 Platelets 245 In any percent to 63.1 Lymphocyte percentage 23.7 MO percentage 9.1 Yield percentage 3.8 PH percentage 0.3 In the #3.6 Site #1.4 Monofilament #0.5 Esophageal 0.2 Basophil 0.0. Hemoglobin A1c at 6.5  Comprehensive metabolic panel: Sodium 140 Potassium 3.8 Chloride 110 CO2 23 A 7 Glucose 174 BUN 17 Creatinine 0.9  Neil N./creatinine 18.8 Calcium 8.1 Total protein 5.1 Albumin 2.6 Globulin 2.5 ALP/DOB is 1.0 Total bilirubin is 0.4 ALT 17 AST 31 ALT 16 Glomerular filtration rate is in excess of 60.  Lipid profile: Cholesterol 94 HDL 25 LDL 40 VLDL 29.3 Triglycerides 147 CH/HDL 3.8  TSH 11.8250  Physical examination:  Patient is alert, verbally appropriate, oriented to person time and place. He is able to do simple math. He is in no acute distress. His wife is  present. Extraocular movements are intact, pupils are equally round and reactive to light, red reflexes easily elicited, unable to fully visualize the optic disc. TMs aren't visible though there is significant cerumen bilaterally. Oral pharynx dentures are in place, otherwise no lesions evident. No apparent carotid bruits, thyromegaly, or adenopathy. Apical pulse with apparent regular rate and rhythm, concerned between 90 and 100. Bilateral breath sounds are entirely completely clear. Abdomen soft distended positive bowel sounds throughout. Pedal edema at easily 3-4+ attending well below the knees. No palpable posterior thigh edema. Pedal pulses are extremely difficult to appreciate, complicated by patient's significant edema. Popliteal pulses faintly palpable. Wound on second toe left foot, likely related to abrasion, will need monitoring.  skin in general, with multiple senile pupura  Impression: Edema Likely severe peripheral vascular disease Severe deconditioning.  Plan I will defer from diuretic at this time, the patient's heart rate tends to stay in the 90s, concerned that he should diuretic might potentially increased heart rate. I will increase his metoprolol from 12.5 mg 4 times a day H. 12.5 mg twice a day and 25 mg twice a day. If the patient is able to tolerate this we'll  Consider increasing to 25 mg 4 times a day. Goal is to help bring heart rate and blood pressure under better control. We'll also order a vascular consult to really look at the patient's arterial status. If the patient is receiving sufficient arterial circulation would recommend sequential compression wrapping and progress to compression stockings at least 20-30 mm of mercury to help control the patient's edema. This protocol is suspended until we know his arterial status. Hypothyroidism his TSH is  elevated at 11.8250. We will increase his levothyroxine from 25 mg a day to levothyroxine 25 mg 3 times a week, and  levothyroxine 4 times a week as noted in the orders. We'll recheck his TSH in one month We will recheck his CBC and basic metabolic panel in 2 weeks. Discussed this plan with the patient's wife and she verbalizes understanding.

## 2013-08-08 ENCOUNTER — Other Ambulatory Visit: Payer: Self-pay | Admitting: *Deleted

## 2013-08-08 DIAGNOSIS — I739 Peripheral vascular disease, unspecified: Secondary | ICD-10-CM

## 2013-08-11 ENCOUNTER — Encounter: Payer: Self-pay | Admitting: Vascular Surgery

## 2013-09-04 ENCOUNTER — Encounter: Payer: Self-pay | Admitting: Vascular Surgery

## 2013-09-05 ENCOUNTER — Ambulatory Visit (INDEPENDENT_AMBULATORY_CARE_PROVIDER_SITE_OTHER): Payer: Medicare Other | Admitting: Vascular Surgery

## 2013-09-05 ENCOUNTER — Ambulatory Visit (HOSPITAL_COMMUNITY)
Admission: RE | Admit: 2013-09-05 | Discharge: 2013-09-05 | Disposition: A | Discharge status: Home / Self Care | Payer: Medicare Other | Source: Ambulatory Visit | Attending: Vascular Surgery | Admitting: Vascular Surgery

## 2013-09-05 ENCOUNTER — Encounter: Payer: Self-pay | Admitting: Vascular Surgery

## 2013-09-05 VITALS — BP 182/83 | HR 88 | Resp 20 | Ht 72.0 in | Wt 170.0 lb

## 2013-09-05 DIAGNOSIS — I7025 Atherosclerosis of native arteries of other extremities with ulceration: Secondary | ICD-10-CM | POA: Insufficient documentation

## 2013-09-05 DIAGNOSIS — I739 Peripheral vascular disease, unspecified: Secondary | ICD-10-CM

## 2013-09-05 DIAGNOSIS — L98499 Non-pressure chronic ulcer of skin of other sites with unspecified severity: Secondary | ICD-10-CM

## 2013-09-05 NOTE — Progress Notes (Signed)
Subjective:     Patient ID: Justin Lane, male   DOB: [birth year], 79 y.o.   MRN: [id number]  HPI this 79 year old male was referred for evaluation of lower extremity occlusive disease with a recent ulcer on the right fourth toe. Ulcer only lasted a few days according to the patient and promptly healed. He does not know the etiology but thinks he may have bumped it on something. He has no history of gangrene of the feet. He does not ambulate because of Parkinson's disease he has had a previous history of bilateral iliac stents inserted by someone other than our practice in 2005 and also has had cardiac stents in the past.  Past Medical History  Diagnosis Date  . GERD (gastroesophageal reflux disease)   . Hypothyroidism   . Anemia     Mild  . Hypertension   . Diabetes mellitus     Goal A1c ~8 due to concern for hypoglycemia  . Hyperlipidemia   . PVD (peripheral vascular disease)     Status post bilateral renal stenting, LE's  . Paroxysmal atrial fibrillation     On Coumadin and rate control meds  . CAD (coronary artery disease)     a. Inf MI 1997 tx with POBA;  b. s/p BMS to RCA for restonosis;  b. NSTEMI 9/04 => LHC 06/22/03:  LM 30%, LAD and CFX irregs, mRCA 80% ISR, EF 60% => PCI: Taxus DES to mRCA  . Hemorrhoids   . Gastritis 06/24/03 & 12/08/06    EGD Christella Hartigan)  . Hiatal hernia   . Gout   . Vitamin B 12 deficiency   . BPH (benign prostatic hyperplasia)   . Diverticular disease     via ACBE  . Herniated disc     L4, L5  . Near syncope 10/07/09  . Parkinson's disease     Per Dr. Terrace Arabia  . Chronic kidney disease     Chronic, with a creatinine in the range of 1.2-1.6;  hx of ARF 11/2009  . Renal calculus     Recurrent lithotripsy  . Hx of echocardiogram     a. Echo 10/2009:  mild LVH, EF 55-60%, b.  Echo 11/13:  mild LVH, EF 70-75%, mid cavity dynamic obstruction wth Valsalva (pk velocity 307 cm/sec, pk gradient 38 mmHg), mild MS, mean gradient 7 mmHg    History  Substance Use  Topics  . Smoking status: Former Smoker    Types: Cigarettes    Quit date: 11/11/1986  . Smokeless tobacco: Never Used     Comment: Quit after MI  . Alcohol Use: No    Family History  Problem Relation Age of Onset  . Heart disease Mother   . Cancer Father     ? Skin  . Diabetes Sister   . Heart disease Brother   . Heart disease Brother     Allergies  Allergen Reactions  . Codeine Sulfate     REACTION: nausea and vomiting    Current outpatient prescriptions:aspirin 81 MG tablet, Take 81 mg by mouth daily. , Disp: , Rfl: ;  atorvastatin (LIPITOR) 80 MG tablet, take 1 tablet by mouth every evening, Disp: 30 tablet, Rfl: 12;  AVODART 0.5 MG capsule, take 1 capsule by mouth once daily, Disp: 30 capsule, Rfl: 12;  carbidopa-levodopa (SINEMET CR) 50-200 MG per tablet, Take 1 tablet by mouth at bedtime., Disp: 30 tablet, Rfl: 6 carbidopa-levodopa (SINEMET IR) 25-100 MG per tablet, Take 1 tablet by mouth 3 (three) times daily.  1 tab at 8am, 1 pm, 6pm, Disp: 90 tablet, Rfl: 6;  Cholecalciferol (VITAMIN D3) 1000 UNITS capsule, Take 1,000 Units by mouth daily.  , Disp: , Rfl: ;  cilostazol (PLETAL) 100 MG tablet, take 1 tablet by mouth twice a day, Disp: 60 tablet, Rfl: 5;  cyanocobalamin 500 MCG tablet, Take 1,000 mcg by mouth daily. , Disp: , Rfl:  docusate sodium (COLACE) 100 MG capsule, Take 1 capsule (100 mg total) by mouth daily., Disp: , Rfl: ;  donepezil (ARICEPT) 10 MG tablet, Take 1 tablet (10 mg total) by mouth daily., Disp: 30 tablet, Rfl: 6;  HYDROcodone-acetaminophen (NORCO/VICODIN) 5-325 MG per tablet, Take 0.5-1 tablets by mouth every 6 (six) hours as needed., Disp: 30 tablet, Rfl: 0 insulin aspart (NOVOLOG) 100 UNIT/ML injection, Inject 0-9 Units into the skin 3 (three) times daily with meals., Disp: 1 vial, Rfl: 12;  levothyroxine (SYNTHROID, LEVOTHROID) 25 MCG tablet, TAKE 1 TABLET BY MOUTH DAILY, Disp: 30 tablet, Rfl: 6;  metoprolol tartrate (LOPRESSOR) 12.5 mg TABS tablet, Take  0.5 tablets (12.5 mg total) by mouth every 6 (six) hours., Disp: 102 tablet, Rfl: 0 mometasone (NASONEX) 50 MCG/ACT nasal spray, Place 2 sprays into the nose daily., Disp: 17 g, Rfl: 12;  nitroGLYCERIN (NITROSTAT) 0.3 MG SL tablet, Place 1 tablet (0.3 mg total) under the tongue every 5 (five) minutes as needed., Disp: 25 tablet, Rfl: 6;  potassium chloride (K-DUR,KLOR-CON) 10 MEQ tablet, Take 1 tablet (10 mEq total) by mouth once., Disp: 30 tablet, Rfl: 0 QUEtiapine (SEROQUEL) 50 MG tablet, Take 1 tablet (50 mg total) by mouth at bedtime., Disp: 30 tablet, Rfl: 0;  amoxicillin-clavulanate (AUGMENTIN) 500-125 MG per tablet, Take 1 tablet (500 mg total) by mouth every 12 (twelve) hours., Disp: 20 tablet, Rfl: 0;  feeding supplement, ENSURE COMPLETE, (ENSURE COMPLETE) LIQD, Take 237 mLs by mouth 2 (two) times daily between meals., Disp: 10 Bottle, Rfl: 0  BP 182/83  Pulse 88  Resp 20  Ht 6' (1.829 m)  Wt 170 lb (77.111 kg)  BMI 23.05 kg/m2  Body mass index is 23.05 kg/(m^2).           Review of Systems history of Parkinson's disease. Only ambulate short distances with help. Denies chest pain but does have dyspnea on exertion and leg pain with walking. Does have diffuse swelling in feet. Claims weakness in arms and legs and difficulty with speech. Also occasional dizziness. All other systems negative and complete review of system    Objective:   Physical Exam BP 182/83  Pulse 88  Resp 20  Ht 6' (1.829 m)  Wt 170 lb (77.111 kg)  BMI 23.05 kg/m2  Gen.-alert and oriented x3 in no apparent distress-frail in appearance HEENT normal for age Lungs no rhonchi or wheezing Cardiovascular regular rhythm no murmurs carotid pulses 3+ palpable no bruits audible Abdomen soft nontender no palpable masses Musculoskeletal free of  major deformities Skin clear -no rashes Neurologic normal Lower extremities 2+ femoral pulses bilaterally. No popliteal or distal pulses palpable. 1+ edema lower third of  both legs and onto the dorsum of foot. No evidence of infection gangrene or active ulceration.  Today I ordered lower tremor the arterial Dopplers. The iliac stents are patent with some areas of stenosis in the 50-60% range right worse than left. There is triphasic flow in both posterior tibial arteries with ABI of 0.47 on the right and 0.52 on the left.       Assessment:     History  of ulcer right fourth toe which healed.-No limb threatening ischemia at present time History of diffuse occlusive disease with satisfactory bilateral ABIs    Plan:     Return if patient has limb threatening ischemia such as infection, cellulitis, gangrene, nonhealing ulcer.

## 2013-09-14 ENCOUNTER — Non-Acute Institutional Stay (SKILLED_NURSING_FACILITY): Payer: Medicare Other | Admitting: Nurse Practitioner

## 2013-09-14 DIAGNOSIS — I798 Other disorders of arteries, arterioles and capillaries in diseases classified elsewhere: Secondary | ICD-10-CM

## 2013-09-14 DIAGNOSIS — E039 Hypothyroidism, unspecified: Secondary | ICD-10-CM

## 2013-09-14 DIAGNOSIS — I2510 Atherosclerotic heart disease of native coronary artery without angina pectoris: Secondary | ICD-10-CM

## 2013-09-14 DIAGNOSIS — R5381 Other malaise: Secondary | ICD-10-CM

## 2013-09-14 DIAGNOSIS — G20 Parkinson's disease: Secondary | ICD-10-CM

## 2013-09-14 DIAGNOSIS — E1351 Other specified diabetes mellitus with diabetic peripheral angiopathy without gangrene: Secondary | ICD-10-CM

## 2013-09-15 ENCOUNTER — Telehealth: Payer: Self-pay

## 2013-09-15 NOTE — Telephone Encounter (Signed)
plz give verbal ok for order below.

## 2013-09-15 NOTE — Telephone Encounter (Signed)
Agree, thanks

## 2013-09-15 NOTE — Telephone Encounter (Signed)
Justin Lane with [facility] left v/m; Justin Lane opened case on Dr Lianne Bushy pt this morning;pt has a 0.8 x 1 x 0.1 stage 2 pressure ulcer on rt buttock.Justin Lane request order for hydrocoy dressing on pressure ulcer to protect and help heal.Please advise. No med changes and BP today was 100/60 ; pts Lasix has been stopped. Justin Lane request cb.

## 2013-09-19 ENCOUNTER — Telehealth: Payer: Self-pay

## 2013-09-19 NOTE — Telephone Encounter (Signed)
Please give the verbal.  Thanks.  

## 2013-09-19 NOTE — Telephone Encounter (Signed)
Justin Lane, PT with [facility] left v/m requesting verbal orders for home health PT 2 x a week for 4 weeks.Please advise.

## 2013-09-19 NOTE — Telephone Encounter (Signed)
Left message on machine with verbal ok, advised to call if any questions

## 2013-09-21 ENCOUNTER — Telehealth: Payer: Self-pay

## 2013-09-21 NOTE — Telephone Encounter (Signed)
Please give the order for hydrocolloid dressing, changed as needed.   If they have other input re: dressing type, please let me know.   Thanks.

## 2013-09-21 NOTE — Telephone Encounter (Signed)
Joy advised. 

## 2013-09-21 NOTE — Telephone Encounter (Signed)
Joy with [facility] left v/m requesting order for dressing for pressure ulcer on pts buttock.Please advise.

## 2013-09-22 ENCOUNTER — Ambulatory Visit (INDEPENDENT_AMBULATORY_CARE_PROVIDER_SITE_OTHER): Payer: Medicare Other | Admitting: Family Medicine

## 2013-09-22 ENCOUNTER — Encounter: Payer: Self-pay | Admitting: Family Medicine

## 2013-09-22 VITALS — BP 122/56 | HR 88 | Temp 98.0°F | Wt 168.0 lb

## 2013-09-22 DIAGNOSIS — L8992 Pressure ulcer of unspecified site, stage 2: Secondary | ICD-10-CM

## 2013-09-22 DIAGNOSIS — L89109 Pressure ulcer of unspecified part of back, unspecified stage: Secondary | ICD-10-CM

## 2013-09-22 DIAGNOSIS — E119 Type 2 diabetes mellitus without complications: Secondary | ICD-10-CM

## 2013-09-22 DIAGNOSIS — E1149 Type 2 diabetes mellitus with other diabetic neurological complication: Secondary | ICD-10-CM

## 2013-09-22 DIAGNOSIS — L89152 Pressure ulcer of sacral region, stage 2: Secondary | ICD-10-CM

## 2013-09-22 DIAGNOSIS — E1142 Type 2 diabetes mellitus with diabetic polyneuropathy: Secondary | ICD-10-CM

## 2013-09-22 DIAGNOSIS — L989 Disorder of the skin and subcutaneous tissue, unspecified: Secondary | ICD-10-CM

## 2013-09-22 DIAGNOSIS — G9340 Encephalopathy, unspecified: Secondary | ICD-10-CM

## 2013-09-22 NOTE — Addendum Note (Signed)
Addended by: Zachery Dauer on: 09/22/2013 10:49 AM   Modules accepted: Level of Service, SmartSet

## 2013-09-22 NOTE — Patient Instructions (Addendum)
Justin Lane will call about your referral. Recheck A1c at a visit in spring of 2015.  Take care. Glad to see you.

## 2013-09-22 NOTE — Progress Notes (Signed)
This encounter was created in error - please disregard.

## 2013-09-22 NOTE — Progress Notes (Signed)
Patient ID: Justin Lane, male   DOB: 1930/05/03, 79 y.o.   MRN: [id number]     July 31, 2013 Donald Pore, DOB:  1930/03/10 Malvin Johns, RM#:  [id number] Code Status:  DNR  Chief Complaint  Patient presents with  . Acute Visit   Report from nursing staff regarding severely edematous feet.  History of present illness, the patient is an 79 year old Caucasian male who this at [facility] for rehabilitation specifically addressing generalized weakness and Parkinson's disease. He has a long history of peripheral arterial disease and sits frequently with his legs hanging down.    Past Medical History  Diagnosis Date  . GERD (gastroesophageal reflux disease)   . Hypothyroidism   . Anemia     Mild  . Hypertension   . Diabetes mellitus     Goal A1c ~8 due to concern for hypoglycemia  . Hyperlipidemia   . PVD (peripheral vascular disease)     Status post bilateral renal stenting, LE's  . Paroxysmal atrial fibrillation     On Coumadin and rate control meds  . CAD (coronary artery disease)     a. Inf MI 1997 tx with POBA;  b. s/p BMS to RCA for restonosis;  b. NSTEMI 9/04 => LHC 06/22/03:  LM 30%, LAD and CFX irregs, mRCA 80% ISR, EF 60% => PCI: Taxus DES to mRCA  . Hemorrhoids   . Gastritis 06/24/03 & 12/08/06    EGD Christella Hartigan)  . Hiatal hernia   . Gout   . Vitamin B 12 deficiency   . BPH (benign prostatic hyperplasia)   . Diverticular disease     via ACBE  . Herniated disc     L4, L5  . Near syncope 10/07/09  . Parkinson's disease     Per Dr. Terrace Arabia  . Chronic kidney disease     Chronic, with a creatinine in the range of 1.2-1.6;  hx of ARF 11/2009  . Renal calculus     Recurrent lithotripsy  . Hx of echocardiogram     a. Echo 10/2009:  mild LVH, EF 55-60%, b.  Echo 11/13:  mild LVH, EF 70-75%, mid cavity dynamic obstruction wth Valsalva (pk velocity 307 cm/sec, pk gradient 38 mmHg), mild MS, mean gradient 7 mmHg   Current Outpatient Prescriptions on File Prior to Visit   Medication Sig Dispense Refill  . amoxicillin-clavulanate (AUGMENTIN) 500-125 MG per tablet Take 1 tablet (500 mg total) by mouth every 12 (twelve) hours.  20 tablet  0  . aspirin 81 MG tablet Take 81 mg by mouth daily.       Marland Kitchen atorvastatin (LIPITOR) 80 MG tablet take 1 tablet by mouth every evening  30 tablet  12  . AVODART 0.5 MG capsule take 1 capsule by mouth once daily  30 capsule  12  . carbidopa-levodopa (SINEMET CR) 50-200 MG per tablet Take 1 tablet by mouth at bedtime.  30 tablet  6  . carbidopa-levodopa (SINEMET IR) 25-100 MG per tablet Take 1 tablet by mouth 3 (three) times daily. 1 tab at 8am, 1 pm, 6pm  90 tablet  6  . Cholecalciferol (VITAMIN D3) 1000 UNITS capsule Take 1,000 Units by mouth daily.        . cilostazol (PLETAL) 100 MG tablet take 1 tablet by mouth twice a day  60 tablet  5  . cyanocobalamin 500 MCG tablet Take 1,000 mcg by mouth daily.       Marland Kitchen docusate sodium (COLACE) 100  MG capsule Take 1 capsule (100 mg total) by mouth daily.      Marland Kitchen donepezil (ARICEPT) 10 MG tablet Take 1 tablet (10 mg total) by mouth daily.  30 tablet  6  . feeding supplement, ENSURE COMPLETE, (ENSURE COMPLETE) LIQD Take 237 mLs by mouth 2 (two) times daily between meals.  10 Bottle  0  . HYDROcodone-acetaminophen (NORCO/VICODIN) 5-325 MG per tablet Take 0.5-1 tablets by mouth every 6 (six) hours as needed.  30 tablet  0  . insulin aspart (NOVOLOG) 100 UNIT/ML injection Inject 0-9 Units into the skin 3 (three) times daily with meals.  1 vial  12  . levothyroxine (SYNTHROID, LEVOTHROID) 25 MCG tablet TAKE 1 TABLET BY MOUTH DAILY  30 tablet  6  . metoprolol tartrate (LOPRESSOR) 12.5 mg TABS tablet Take 0.5 tablets (12.5 mg total) by mouth every 6 (six) hours.  102 tablet  0  . mometasone (NASONEX) 50 MCG/ACT nasal spray Place 2 sprays into the nose daily.  17 g  12  . nitroGLYCERIN (NITROSTAT) 0.3 MG SL tablet Place 1 tablet (0.3 mg total) under the tongue every 5 (five) minutes as needed.  25  tablet  6  . potassium chloride (K-DUR,KLOR-CON) 10 MEQ tablet Take 1 tablet (10 mEq total) by mouth once.  30 tablet  0  . QUEtiapine (SEROQUEL) 50 MG tablet Take 1 tablet (50 mg total) by mouth at bedtime.  30 tablet  0   No current facility-administered medications on file prior to visit.    Vital signs: Blood pressure 157/75 Temperature 90.8 Pulse 91 Respiratory rate 22 Oximetry 95%.  Most recent labs include, from 07/20/2013, CBC: WBC 5.7 RBC 3.5 Hgb 10.9 HCT 33.6 MCB 95.5 MCH 31 MCHC 32.4 RDW 14.2 Platelets 245 Neutrophil % 63.1 Lymphocyte percentage 23.7 Neutrophil #3.6 Lymphocyte #1.4 Monophils #0.5  Basophil 0.0. Hemoglobin A1c at 6.5  Comprehensive metabolic panel: Sodium 140 Potassium 3.8 Chloride 110 CO2 23 A 7 Glucose 174 BUN 17 Creatinine 0.9 Calcium 8.1 Total protein 5.6 Albumin 2.6 Globulin 2.5 Total bilirubin is 0.4 ALT 17 AST 31 ALT 16  Lipid profile: Cholesterol 94 HDL 25 LDL 40 VLDL 29.3 Triglycerides 147 CH/HDL 3.8  TSH 11.8250  Physical examination:  Patient is alert, verbally appropriate, oriented to person time and place. He is able to do simple math. He is in no acute distress. His wife is present.  Extraocular movements are intact, pupils are equally round and reactive to light, red reflexes easily elicited, unable to fully visualize the optic disc.  TMs aren't visible though there is significant cerumen bilaterally.  Oral pharynx dentures are in place, otherwise no lesions evident.  No apparent carotid bruits, thyromegaly, or adenopathy.  Apical pulse with apparent regular rate and rhythm, concerned between 90 and 100 . Bilateral breath sounds are entirely completely clear.  Abdomen soft distended positive bowel sounds throughout.  Pedal edema at easily 3-4+ attending well below the knees. No palpable posterior thigh edema.  Pedal pulses are extremely difficult to appreciate, complicated by patient's  significant edema. Popliteal pulses faintly palpable.  Wound on second toe left foot, likely related to abrasion, will need monitoring.  skin in general, with multiple senile pupura  Impression:  Severe deconditioning, pt is to continue his work with PT/OT, and have appropriate calorie and protein intake.    Plan I will defer from diuretic at this time, the patient's heart rate tends to stay in the 90s, concerned that he should diuretic might potentially increased heart  rate. I will increase his metoprolol from 12.5 mg 4 times a day H. 12.5 mg twice a day and 25 mg twice a day. If the patient is able to tolerate this we'll  Consider increasing to 25 mg 4 times a day. Goal is to help bring heart rate and blood pressure under better control.  Use of a diuretic remains an option if the edema does not gradually decrease.  Will certainly continue to monitor  We'll also order a vascular consult to really look at the patient's arterial status. If the patient is receiving sufficient arterial circulation would recommend sequential compression wrapping and progress to compression stockings at least 20-30 mm of mercury to help control the patient's edema. This protocol is suspended until we know his arterial status.  Will certainly continue to monitor.  Toe wound will be kept clean and dry.    Have instructed pt and his wife that pt's legs must be kept elevated as much as possible.  Hypothyroidism his TSH is elevated at 11.8250. We will increase his levothyroxine from 25 mg a day to levothyroxine 25 mg 3 times a week, and levothyroxine 37.4 mcg four  times a week as noted in the orders. We'll recheck his TSH in one month Will continue to monitor  Diabetes is under excellent control, will monitor the pt for risk of hypoglycemic episodes  We will recheck his CBC and basic metabolic panel in 2 weeks.  Discussed this plan with the patient's wife and she verbalizes understanding.

## 2013-09-22 NOTE — Progress Notes (Signed)
Subjective:     Patient ID: Justin Lane, male   DOB: 09/15/1930, 79 y.o.   MRN: [id number]  HPI   Review of Systems     Objective:   Physical Exam     Assessment:        Plan:

## 2013-09-22 NOTE — Progress Notes (Signed)
Pre-visit discussion using our clinic review tool. No additional management support is needed unless otherwise documented below in the visit note.  Admitted with delirium, resolved now. To rehab, now back home.  Able to walk 532ft with rests and with walker. Has help from family and HH at home.  Sacral sore noted, has a chair cushion to offload pressure.  Eating "okay" and sugar controlled, 160 today.  No falls. No CP.    B cerumen impaction noted by patient.   Skin lesion noted posterior to L pinna. Not sore, but not healing.   Meds, vitals, and allergies reviewed.   ROS: See HPI.  Otherwise, noncontributory.  Nad, in wheelchair Mmm rrr ctab abd soft Ext with 1+ edema 1cm red lesion noted posterior to the L pinna.   Tremor at baseline B cerumen impaction removed with irrigation and manual removal.  Tolerated well w/o complication.  ~1cm sacral sore noted on R buttock, near midline, superficial, doesn't appear infected.

## 2013-09-23 NOTE — Assessment & Plan Note (Signed)
We'll recheck later in spring of 2015, A1c likely at the OV.  Continue as is for now.

## 2013-09-23 NOTE — Assessment & Plan Note (Signed)
resolved now, no change in meds. Continue as is.  Pt and son agree.

## 2013-09-23 NOTE — Assessment & Plan Note (Signed)
Off load, topical care. Should resolve.

## 2013-09-23 NOTE — Assessment & Plan Note (Signed)
Refer to derm  ?

## 2013-10-03 ENCOUNTER — Other Ambulatory Visit: Payer: Self-pay | Admitting: Family Medicine

## 2013-10-03 MED ORDER — ATORVASTATIN CALCIUM 80 MG PO TABS: ORAL_TABLET | ORAL | Status: DC

## 2013-10-03 MED ORDER — FLUTICASONE PROPIONATE 50 MCG/ACT NA SUSP: 2.0000 | Freq: Every day | NASAL | Status: DC

## 2013-10-03 MED ORDER — ASPIRIN 81 MG PO TABS: 81.0000 mg | ORAL_TABLET | Freq: Every day | ORAL | Status: DC

## 2013-10-03 MED ORDER — CILOSTAZOL 100 MG PO TABS: ORAL_TABLET | ORAL | Status: DC

## 2013-10-03 MED ORDER — ZINC SULFATE 220 (50 ZN) MG PO CAPS: 220.0000 mg | ORAL_CAPSULE | Freq: Every day | ORAL | Status: DC

## 2013-10-03 MED ORDER — VITAMIN C 500 MG PO TABS: 500.0000 mg | ORAL_TABLET | Freq: Two times a day (BID) | ORAL | Status: DC

## 2013-10-03 MED ORDER — DONEPEZIL HCL 10 MG PO TABS: 10.0000 mg | ORAL_TABLET | Freq: Every day | ORAL | Status: DC

## 2013-10-03 MED ORDER — POTASSIUM CHLORIDE CRYS ER 10 MEQ PO TBCR: 10.0000 meq | EXTENDED_RELEASE_TABLET | Freq: Once | ORAL | Status: DC

## 2013-10-03 MED ORDER — PRO-STAT PO LIQD: ORAL | Status: DC

## 2013-10-03 MED ORDER — INSULIN LISPRO 100 UNIT/ML CARTRIDGE: SUBCUTANEOUS | Status: DC

## 2013-10-03 MED ORDER — CYANOCOBALAMIN 500 MCG PO TABS: 1000.0000 ug | ORAL_TABLET | Freq: Every day | ORAL | Status: DC

## 2013-10-03 MED ORDER — METOPROLOL TARTRATE 25 MG PO TABS: 25.0000 mg | ORAL_TABLET | Freq: Two times a day (BID) | ORAL | Status: DC

## 2013-10-03 MED ORDER — SKIN PREP WIPES MISC: Status: DC

## 2013-10-03 MED ORDER — NYSTATIN 100000 UNIT/GM EX CREA: TOPICAL_CREAM | CUTANEOUS | Status: DC

## 2013-10-03 MED ORDER — DUTASTERIDE 0.5 MG PO CAPS: ORAL_CAPSULE | ORAL | Status: DC

## 2013-10-03 MED ORDER — LEVOTHYROXINE SODIUM 25 MCG PO TABS: ORAL_TABLET | ORAL | Status: DC

## 2013-10-03 MED ORDER — COLLAGENASE 250 UNIT/GM EX OINT: 1.0000 "application " | TOPICAL_OINTMENT | Freq: Every day | CUTANEOUS | Status: DC

## 2013-10-03 MED ORDER — CHOLECALCIFEROL 25 MCG (1000 UT) PO CAPS: 1000.0000 [IU] | ORAL_CAPSULE | Freq: Every day | ORAL | Status: DC

## 2013-10-03 MED ORDER — DOCUSATE SODIUM 100 MG PO CAPS: 100.0000 mg | ORAL_CAPSULE | Freq: Every day | ORAL | Status: DC

## 2013-10-03 MED ORDER — NITROGLYCERIN 0.3 MG SL SUBL: 0.3000 mg | SUBLINGUAL_TABLET | SUBLINGUAL | Status: DC | PRN

## 2013-10-03 NOTE — Progress Notes (Signed)
Request for orders/meds.  Sent to pharmacy.

## 2013-10-06 ENCOUNTER — Other Ambulatory Visit: Payer: Self-pay | Admitting: Family Medicine

## 2013-10-06 NOTE — Telephone Encounter (Signed)
Dr. Damita Dunnings out of office

## 2013-10-09 ENCOUNTER — Other Ambulatory Visit: Payer: Self-pay | Admitting: *Deleted

## 2013-10-09 NOTE — Telephone Encounter (Signed)
Spoke to pt and informed him that Rx is available for pickup at the front desk;pt informed gov't issued photo id required for pickup 

## 2013-10-16 ENCOUNTER — Other Ambulatory Visit: Payer: Self-pay | Admitting: *Deleted

## 2013-10-16 MED ORDER — CARBIDOPA-LEVODOPA 25-100 MG PO TABS: 1.0000 | ORAL_TABLET | Freq: Three times a day (TID) | ORAL | Status: DC

## 2013-10-17 NOTE — Progress Notes (Signed)
Patient ID: Justin Lane, male   DOB: 02-12-1930, 80 y.o.   MRN: [id number]  Date of visit 09/14/2013 Justin Lane, DOB:  03-Aug-1930 Justin Lane, RM#:  [id number] Code Status:  DNR  Chief Complaint  Patient presents with  . Discharge Note     History of present illness, pt is an 80 year old Caucasian male preparing for d/c, no new c/o verbalized.  Review of Systems  Constitutional: Negative for fever and chills.  HENT: Negative for ear discharge and nosebleeds.   Eyes: Negative for pain, discharge and redness.  Respiratory: Negative for hemoptysis.   Cardiovascular: Positive for leg swelling.  Gastrointestinal: Negative for blood in stool.  Genitourinary: Negative for hematuria and flank pain.  Neurological: Positive for weakness. Negative for seizures and loss of consciousness.  Psychiatric/Behavioral: Positive for memory loss.      Past Medical History  Diagnosis Date  . GERD (gastroesophageal reflux disease)   . Hypothyroidism   . Anemia     Mild  . Hypertension   . Diabetes mellitus     Goal A1c ~8 due to concern for hypoglycemia  . Hyperlipidemia   . PVD (peripheral vascular disease)     Status post bilateral renal stenting, LE's  . Paroxysmal atrial fibrillation     On Coumadin and rate control meds  . CAD (coronary artery disease)     a. Inf MI 1997 tx with POBA;  b. s/p BMS to RCA for restonosis;  b. NSTEMI 9/04 => LHC 06/22/03:  LM 30%, LAD and CFX irregs, mRCA 80% ISR, EF 60% => PCI: Taxus DES to mRCA  . Hemorrhoids   . Gastritis 06/24/03 & 12/08/06    EGD Ardis Hughs)  . Hiatal hernia   . Gout   . Vitamin B 12 deficiency   . BPH (benign prostatic hyperplasia)   . Diverticular disease     via ACBE  . Herniated disc     L4, L5  . Near syncope 10/07/09  . Parkinson's disease     Per Dr. Krista Blue  . Chronic kidney disease     Chronic, with a creatinine in the range of 1.2-1.6;  hx of ARF 11/2009  . Renal calculus     Recurrent lithotripsy  . Hx of  echocardiogram     a. Echo 10/2009:  mild LVH, EF 55-60%, b.  Echo 11/13:  mild LVH, EF 70-75%, mid cavity dynamic obstruction wth Valsalva (pk velocity 307 cm/sec, pk gradient 38 mmHg), mild MS, mean gradient 7 mmHg   Medications  ASPIRIN 81 MG TABLET     Take 81 mg by mouth daily.     ATORVASTATIN (LIPITOR) 80 MG TABLET     take 1 tablet by mouth every evening    AVODART 0.5 MG CAPSULE     take 1 capsule by mouth once daily    CARBIDOPA-LEVODOPA (SINEMET CR) 50-200 MG PER TABLET     Take 1 tablet by mouth at bedtime.    CARBIDOPA-LEVODOPA (SINEMET IR) 25-100 MG PER TABLET     Take 1 tablet by mouth 3 (three) times daily. 1 tab at 8am, 1 pm, 6pm    CHOLECALCIFEROL (VITAMIN D3) 1000 UNITS CAPSULE     Take 1,000 Units by mouth daily.      CILOSTAZOL (PLETAL) 100 MG TABLET     take 1 tablet by mouth twice a day    CYANOCOBALAMIN 500 MCG TABLET     Take 1,000 mcg by mouth daily.  DOCUSATE SODIUM (COLACE) 100 MG CAPSULE     Take 1 capsule (100 mg total) by mouth daily.    DONEPEZIL (ARICEPT) 10 MG TABLET     Take 1 tablet (10 mg total) by mouth daily.    FEEDING SUPPLEMENT, ENSURE COMPLETE, (ENSURE COMPLETE) LIQD     Take 237 mLs by mouth 2 (two) times daily between meals.    HYDROCODONE-ACETAMINOPHEN (NORCO/VICODIN) 5-325 MG PER TABLET     Take 0.5-1 tablets by mouth every 6 (six) hours as needed.    INSULIN ASPART (NOVOLOG) 100 UNIT/ML INJECTION     Inject 0-9 Units into the skin 3 (three) times daily with meals.    LEVOTHYROXINE (SYNTHROID, LEVOTHROID) 25 MCG TABLET     TAKE 1 TABLET BY MOUTH DAILY    METOPROLOL TARTRATE (LOPRESSOR) 12.5 MG TABS TABLET     Take 0.5 tablets (12.5 mg total) by mouth every 6 (six) hours.    MOMETASONE (NASONEX) 50 MCG/ACT NASAL SPRAY     Place 2 sprays into the nose daily.    NITROGLYCERIN (NITROSTAT) 0.3 MG SL TABLET     Place 1 tablet (0.3 mg total) under the tongue every 5 (five) minutes as needed.    POTASSIUM CHLORIDE  (K-DUR,KLOR-CON) 10 MEQ TABLET     Take 1 tablet (10 mEq total) by mouth once.    QUETIAPINE (SEROQUEL) 50 MG TABLET     Take 1 tablet (50 mg total) by mouth at bedtime.     Vital signs: Blood pressure 120/74 Temperature 98.1 Pulse 86 Respiratory rate 20 Oximetry 96%. TSH 7.363  Labs from 07/14/2013 WBC 6.9 RBC 3.5 Hemoglobin 10.2 Hematocrit 33.3 MCV 94.9 MCH 29.1 MCHC 34 6 RDW 13.4 Platelets 302  Sodium 137 Potassium 3.6 Chloride 107 CO2 22 AGAP 8 Glucose 239 BUN 19 Creatinine 1.1 Calcium 8.5  From 07/20/2013 WBC 5.7 RBC 3.5 Hgb 10.9 HCT 33.6 MCB 95.5 MCH 31 MCHC 32.4 RDW 14.2 Platelets 245 Neutrophil % 63.1 Lymphocyte percentage 23.7 Neutrophil #3.6 Lymphocyte #1.4 Monophils #0.5  Basophil 0.0. Hemoglobin A1c at 6.5  Comprehensive metabolic panel: Sodium [id number] Potassium 3.8 Chloride 110 CO2 23 AGAP 7 Glucose 174 BUN 17 Creatinine 0.9 Calcium 8.1 Total protein 5.6 Albumin 2.6 Globulin 2.5 Total bilirubin is 0.4 ALT 17 AST 31 ALT 16  Lipid profile: Cholesterol 94 HDL 25 LDL 40 VLDL 29.3 Triglycerides 147 CH/HDL 3.8  TSH 11.8250  Physical examination:  Patient is alert, verbally appropriate, oriented to person time and place. He is able to do simple math. He is in no acute distress. His wife is present.  Extraocular movements are intact, pupils are equally round and reactive to light, red reflexes easily elicited, unable to fully visualize the optic disc.  TMs aren't visible though there is significant cerumen bilaterally.  Oral pharynx dentures are in place, otherwise no lesions evident.  No apparent carotid bruits, thyromegaly, or adenopathy.  Apical pulse with apparent regular rate and rhythm, concerned between 90 and 100 . Bilateral breath sounds are entirely completely clear.  Abdomen soft distended positive bowel sounds throughout.  Pedal edema at 2 +,ending well below the knees. No palpable posterior thigh  edema.  Pedal pulses are extremely difficult to appreciate Popliteal pulses faintly palpable.  Wound on second toe left foot, likely related to abrasion, will need monitoring.  skin in general, with multiple senile pupura  Impression:  Severe deconditioning, pt is to be discharged with home health services of PT/OT for mobility, strengthening, safety, and adaptation for ADLs.  and have appropriate calorie and protein intake.  He will have ongoing followup by his primary care provider . Peripheral vascular disease, will continue Pletal at 100 mg twice a day, will also continue aspirin 81 mg a day  Parkinson's disease we'll continue the Sinemet immediate release 25/100 one 3 times a day., Will continue the controlled release 50/200 at bedtime. Will continue the Aricept 10 mg per day   Hypertension we'll continue the metoprolol 12.5 mg  Benign prostatic hypertrophy, will continue the Avodart 0.5 mg per day  Lipid metabolism disorder we'll continue the Lipitor 80 mg per day and this will be monitored by his primary care provider.  Supplementation, vitamin B12 500 mcg by mouth each day, will continue the Ensure or one can twice a day.  Also continue vitamin D 3 1000 units each day  Hypertension/coronary artery disease, will continue the metoprolol as per regimen, will be monitored by his primary care provider.  We'll also continue the Nitrostat 0.3 mg sublingual tablet, one tablet under tongue every 5 minutes then call 911 if not effective be monitored by his primary care provider.  Dementia, Seroquel 50 mg at bedtime, will be monitored by his primary care provider.  Patient's edema is improved, will be followed by his primary care provider Have instructed pt and his wife that pt's legs must be kept elevated as much as possible.  Hypothyroidism his TSH is elevated at 11.8250. We will increase his levothyroxine from 25 mg a day to levothyroxine 25 mg 3 times a week, and levothyroxine 37.4  mcg four  times a week as noted in the orders.Most recentI TSH is 7.360, will not change the patient's regimen as it shows a steady decrease. Patient will be followed by his primary care provider  Diabetes is under excellent control, will monitor the pt for risk of hypoglycemic episodes, patient will be followed by his primary care provider

## 2013-10-19 ENCOUNTER — Ambulatory Visit (INDEPENDENT_AMBULATORY_CARE_PROVIDER_SITE_OTHER): Payer: Medicare Other | Admitting: Neurology

## 2013-10-19 ENCOUNTER — Encounter: Payer: Self-pay | Admitting: Neurology

## 2013-10-19 VITALS — BP 132/75 | HR 87 | Ht 69.0 in | Wt 168.0 lb

## 2013-10-19 DIAGNOSIS — E1149 Type 2 diabetes mellitus with other diabetic neurological complication: Secondary | ICD-10-CM

## 2013-10-19 DIAGNOSIS — E538 Deficiency of other specified B group vitamins: Secondary | ICD-10-CM

## 2013-10-19 DIAGNOSIS — R413 Other amnesia: Secondary | ICD-10-CM

## 2013-10-19 DIAGNOSIS — E039 Hypothyroidism, unspecified: Secondary | ICD-10-CM

## 2013-10-19 DIAGNOSIS — G20 Parkinson's disease: Secondary | ICD-10-CM

## 2013-10-19 NOTE — Progress Notes (Signed)
HPI: Justin Lane, 80 year old white male returns today for followup of his Parkinson's disease, mild memory trouble. He is accompanied by his son at today's clinical visit.   History: He has past medical history of diabetes, hypertension,coronary arteryand disease, peripheral vascular disease, hyperlipidemia he presenting gradual onset  short-term memory trouble, left hand tremor, mild gait changes since 2012   He graduated from high school, work at [employer] for 40 years, filling orders and stacking supplies, he retired in 1992, per family members, he is always witty, until about a year ago in 2012, family noticed he become easily confused, can't find his way around, becomes very quiet, rarely talks during his dinner time anymore, he also developed  left hand tremor,  he can control it, most noticable when he sitting and relaxing.  He also complains of generalized weakness, tiredness across his shoulder, he has become very sedentary.He denied constipation, tends to move around during sleep, He also complains of loss of smell, no visual hallucination, he denies bilateral lower extremity paresthesia or weakness.  He found to have mild parkinsonian features, along was memory loss, differentiation diagnosis including idiopathic Parkinson's disease, vs. Lewy body dementia.   He lives with his wife.  MRI brain showed  moderate diffuse atrophy. Mild chronic small vessel ischemic disease.   He tolerates aricept 10mg  qday and sinemet 25/100 tid ,  Sinemet CR was added later to helped ability to turn over in bed, decreased tremor in the mornings and with getting up several times at night to go to the bathroom.    UPDATE Jan 15th 2015 He admitted in [facility] for sinusitis, dehydration, hallucination in Oct 2014, discharged to rehab till 09/2013, he is now living at home with his his wife, he can walk with walker now, but has worsening balance difficulty,  He continues to have mild memory loss, improved appetite, he  sleeps well, using hospital bed,    ROS:  Hearing loss, trouble swallowing, shortness of breath, easy bruising, dizziness, tremor, blurred vision  Physical Exam General: Tired-looking elderly gentleman, in wheelchair Head: head normocephalic and atraumatic. Oropharynx benign Neck: supple with no carotid  bruits Cardiovascular: regular rate and rhythm, no murmurs Skin: Bruising and hands and forearms  Neurologic Exam Mental Status: Awake and fully alert. Oriented to place and time. MMSE 28/30, he has difficulty writing sentence, copy design.   AFT 12. Follows all commands. Speech and language normal.   Cranial Nerves:  Pupils equal, briskly reactive to light. Extraocular movements full without nystagmus. Visual fields full to confrontation. Hearing intact and symmetric to finger snap. Facial sensation intact. Face, tongue, palate move normally and symmetrically. Neck flexion and extension normal. Negative Myerson sign Motor: Marland Kitchen Normal strength in all tested extremity muscles.No focal weakness. Moderate right more than left limb and nuchal rigidity  Coordination: Rapid alternating movements normal in all extremities. Finger-to-nose performed accurately bilaterally. No dysmetria Gait and Station: Needs to get up from seated position with help, difficulty initiate gait, small stride,  Reflexes: 1+ and symmetric. Toes downgoing.   ASSESSMENT: 80 year old male with short-term memory trouble and resting tremor. CNS degenerative disorder versus Lewy body dementia versus Alzheimer's disease with Parkinson features  PLAN:  Increase carbidopa levodopa short acting 25/100mg  to 1 and 1/2 tabs three times a day    Continue Aricept for memory, Mini-Mental Status exam stable  follow up in 6 months

## 2013-10-22 ENCOUNTER — Encounter: Payer: Self-pay | Admitting: Family Medicine

## 2013-10-22 DIAGNOSIS — Z789 Other specified health status: Secondary | ICD-10-CM | POA: Insufficient documentation

## 2013-10-26 ENCOUNTER — Other Ambulatory Visit: Payer: Self-pay | Admitting: Family Medicine

## 2013-10-26 MED ORDER — HYDROCODONE-ACETAMINOPHEN 5-325 MG PO TABS: ORAL_TABLET | ORAL | Status: DC

## 2013-10-26 NOTE — Telephone Encounter (Signed)
Printed, please give to patient.  

## 2013-10-26 NOTE — Telephone Encounter (Signed)
Electronic refill request.  Please advise. 

## 2013-10-27 ENCOUNTER — Other Ambulatory Visit: Payer: Self-pay

## 2013-10-27 NOTE — Telephone Encounter (Signed)
Patient advised.  Rx left at front desk for pick up. 

## 2013-11-02 ENCOUNTER — Encounter: Payer: Self-pay | Admitting: Family Medicine

## 2013-11-22 ENCOUNTER — Other Ambulatory Visit: Payer: Self-pay | Admitting: Family Medicine

## 2013-11-22 MED ORDER — HYDROCODONE-ACETAMINOPHEN 5-325 MG PO TABS: ORAL_TABLET | ORAL | Status: DC

## 2013-11-22 NOTE — Progress Notes (Signed)
rx printed

## 2013-11-23 NOTE — Telephone Encounter (Signed)
Encounter completed.

## 2013-11-23 NOTE — Telephone Encounter (Signed)
Patient advised.  Rx left at front desk for pick up. 

## 2013-12-01 ENCOUNTER — Ambulatory Visit: Payer: Medicare Other | Admitting: Cardiovascular Disease

## 2013-12-11 ENCOUNTER — Other Ambulatory Visit: Payer: Self-pay

## 2013-12-11 MED ORDER — CARBIDOPA-LEVODOPA 25-100 MG PO TABS: 1.5000 | ORAL_TABLET | Freq: Three times a day (TID) | ORAL | Status: DC

## 2013-12-11 MED ORDER — CARBIDOPA-LEVODOPA ER 50-200 MG PO TBCR: 1.0000 | EXTENDED_RELEASE_TABLET | Freq: Every day | ORAL | Status: DC

## 2013-12-11 NOTE — Telephone Encounter (Signed)
Last Ov says: Sinemet CR was added later to helped ability to turn over in bed, decreased tremor in the mornings and with getting up several times at night to go to the bathroom.

## 2013-12-12 ENCOUNTER — Other Ambulatory Visit: Payer: Self-pay | Admitting: Family Medicine

## 2013-12-12 ENCOUNTER — Other Ambulatory Visit: Payer: Self-pay

## 2013-12-12 NOTE — Telephone Encounter (Signed)
MyChart Request.  Please advise.

## 2013-12-12 NOTE — Telephone Encounter (Signed)
Pt left v/m requesting rx hydrocodone apap. Call when ready for pick up.  

## 2013-12-13 ENCOUNTER — Other Ambulatory Visit: Payer: Self-pay

## 2013-12-13 MED ORDER — HYDROCODONE-ACETAMINOPHEN 5-325 MG PO TABS: ORAL_TABLET | ORAL | Status: DC

## 2013-12-13 NOTE — Telephone Encounter (Signed)
Printed.  Thanks.  

## 2013-12-13 NOTE — Telephone Encounter (Signed)
Patient's wife notified that script is up front ready for pickup. 

## 2013-12-13 NOTE — Telephone Encounter (Signed)
Printed from [employer] request.

## 2013-12-25 ENCOUNTER — Telehealth: Payer: Self-pay | Admitting: Family Medicine

## 2013-12-25 ENCOUNTER — Encounter: Payer: Self-pay | Admitting: Family Medicine

## 2013-12-25 MED ORDER — INSULIN LISPRO 100 UNIT/ML CARTRIDGE: SUBCUTANEOUS | Status: DC

## 2013-12-25 NOTE — Telephone Encounter (Signed)
-----   Message from Floreen Comber to Tonia Ghent, MD sent at 12/25/2013 7:57 AM ----- Our family is writing to update you on our father Justin Lane. Dad is not eating well at all. We tried protein shakes earlier but they tend to give him diarrhea and so he doesn't want them either. He is taking in on a few bites in the morning. Experiencing some low sugars, weaker, and lost more weight. Not scheduled to see you until May. Please call Antony Haste [phone] if we need to do something.   I called Pt's son.  See above.  He has minimal insulin requirements by sliding scale now.  I would hold this for now, as long as his sugars are reasonable.  We can always restart if needed.    We talked about progressing of parkinson's sx.  I will ask for neuro input- I don't know if adjustment of his sinemet would be reasonable, including possible dec in dose.  I would like neuro input.  Routed to Dr. Krista Blue as a FYI.   He has variable levels of alertness and we discussed possible Hospice tx.  Family will likely consider this, depending on his course.

## 2013-12-26 NOTE — Telephone Encounter (Signed)
I have called his home, was not able to leave a message, will try to call him again later.

## 2014-01-01 ENCOUNTER — Telehealth: Payer: Self-pay

## 2014-01-01 MED ORDER — ONDANSETRON HCL 4 MG PO TABS: 4.0000 mg | ORAL_TABLET | Freq: Three times a day (TID) | ORAL | Status: DC | PRN

## 2014-01-01 NOTE — Telephone Encounter (Signed)
zofran sent prn nausea.  Offer f/u here if needed.  I did communicate with Dr. Krista Blue. Dr. Krista Blue had tried to call them back and couldn't leave a message.  Please have them call Dr. Rhea Belton office.  Thanks.

## 2014-01-01 NOTE — Telephone Encounter (Signed)
Alan's wife said pt has not been eating well, when pt tries to eat pt feels like food gets stuck and then pt gets nauseated. Request med for nausea sent to [facility]. Also Antony Haste request cb about f/u call to Dr Krista Blue. Antony Haste can be reached [phone].Please advise.

## 2014-01-01 NOTE — Telephone Encounter (Signed)
Antony Haste advised.

## 2014-01-02 ENCOUNTER — Ambulatory Visit (INDEPENDENT_AMBULATORY_CARE_PROVIDER_SITE_OTHER): Payer: Medicare Other | Admitting: Family Medicine

## 2014-01-02 ENCOUNTER — Telehealth: Payer: Self-pay | Admitting: Neurology

## 2014-01-02 ENCOUNTER — Encounter: Payer: Self-pay | Admitting: Family Medicine

## 2014-01-02 VITALS — BP 142/80 | HR 86 | Temp 97.6°F | Wt 155.2 lb

## 2014-01-02 DIAGNOSIS — R634 Abnormal weight loss: Secondary | ICD-10-CM | POA: Insufficient documentation

## 2014-01-02 DIAGNOSIS — E119 Type 2 diabetes mellitus without complications: Secondary | ICD-10-CM

## 2014-01-02 MED ORDER — DOXYCYCLINE HYCLATE 100 MG PO TABS: 100.0000 mg | ORAL_TABLET | Freq: Two times a day (BID) | ORAL | Status: DC

## 2014-01-02 NOTE — Progress Notes (Signed)
Pre visit review using our clinic review tool, if applicable. No additional management support is needed unless otherwise documented below in the visit note.  Recently with inc drooling, dysphagia that feels likely it is happening in the lower esophagus- not the upper esophagus or throat, dec in appetite and weight, inc in tremor.  More throat clearing.  Not SOB but not active.  Complaint with parkinson's meds. Some nausea, slightly better with zofran.   Recently with sinus pain, sinusitis hx noted.  No fevers.  Some rhinorrhea.    Meds, vitals, and allergies reviewed.   ROS: See HPI.  Otherwise, noncontributory.  nad but thin and in wheelchair Affect looks flatter than prev Tm wnl Nasal and OP exam wnl, except for inc in salivation Sinuses slightly ttp x4 Neck supple, no LA rrr ctab abd soft Ext w/1+ BLE edema. Tremor noted in the hands B

## 2014-01-02 NOTE — Telephone Encounter (Signed)
Per Dr. Krista Blue, I have called patient's son to check on patient and see if he needs to come in sooner to see Dr. Krista Blue.  Patient is not eating well and feels as if his food is getting stuck.  The patient will be seeing Dr. Damita Dunnings today, and son will wait for his input about seeing neurology sooner.

## 2014-01-02 NOTE — Patient Instructions (Signed)
Start the doxycycline today and go to the lab on the way out.  We'll contact you with your lab report. I'll check with your other docs in the meantime.   Take care.

## 2014-01-02 NOTE — Assessment & Plan Note (Signed)
Likely multifactorial, from parkinson's sx with possible esophageal pathology/stricture and possible sinusitis.  Will ask for neuro input, esp with inc in parkinson's sx.  Will d/w GI re: possible eval at this point.  I don't know if EGD would be possible.  His intake is minimal.  Start doxy for presumed sinusitis.   Hold insulin for now.  See notes on labs.   If LDL very low, we may need to stop statin for now.  D/w pt and family.  App help of all involved.  >25 minutes spent in face to face time with patient, >50% spent in counselling or coordination of care.

## 2014-01-03 ENCOUNTER — Telehealth: Payer: Self-pay | Admitting: Family Medicine

## 2014-01-03 DIAGNOSIS — R1319 Other dysphagia: Secondary | ICD-10-CM

## 2014-01-03 LAB — LIPID PANEL
Cholesterol: 136 mg/dL (ref 0–200)
HDL: 62.7 mg/dL (ref >39.00)
LDL CALC: 51 mg/dL (ref 0–99)
TRIGLYCERIDES: 110 mg/dL (ref 0.0–149.0)
Total CHOL/HDL Ratio: 2
VLDL: 22 mg/dL (ref 0.0–40.0)

## 2014-01-03 LAB — CBC WITH DIFFERENTIAL/PLATELET
Basophils Absolute: 0.1 10*3/uL (ref 0.0–0.1)
Basophils Relative: 1.7 % (ref 0.0–3.0)
Eosinophils Absolute: 0 10*3/uL (ref 0.0–0.7)
Eosinophils Relative: 0.7 % (ref 0.0–5.0)
HEMATOCRIT: 31.9 % — AB (ref 39.0–52.0)
Hemoglobin: 10.5 g/dL — ABNORMAL LOW (ref 13.0–17.0)
Lymphocytes Relative: 19.2 % (ref 12.0–46.0)
Lymphs Abs: 1.1 10*3/uL (ref 0.7–4.0)
MCHC: 32.9 g/dL (ref 30.0–36.0)
MCV: 86.7 fl (ref 78.0–100.0)
Monocytes Absolute: 0.5 10*3/uL (ref 0.1–1.0)
Monocytes Relative: 8.8 % (ref 3.0–12.0)
NEUTROS PCT: 69.6 % (ref 43.0–77.0)
Neutro Abs: 3.9 10*3/uL (ref 1.4–7.7)
PLATELETS: 224 10*3/uL (ref 150.0–400.0)
RBC: 3.68 Mil/uL — ABNORMAL LOW (ref 4.22–5.81)
RDW: 16.3 % — AB (ref 11.5–14.6)
WBC: 5.6 10*3/uL (ref 4.5–10.5)

## 2014-01-03 LAB — COMPREHENSIVE METABOLIC PANEL
ALBUMIN: 3.4 g/dL — AB (ref 3.5–5.2)
ALT: 5 U/L (ref 0–53)
AST: 12 U/L (ref 0–37)
Alkaline Phosphatase: 70 U/L (ref 39–117)
BILIRUBIN TOTAL: 1.1 mg/dL (ref 0.3–1.2)
BUN: 30 mg/dL — ABNORMAL HIGH (ref 6–23)
CO2: 24 mEq/L (ref 19–32)
Calcium: 9 mg/dL (ref 8.4–10.5)
Chloride: 101 mEq/L (ref 96–112)
Creatinine, Ser: 1.7 mg/dL — ABNORMAL HIGH (ref 0.4–1.5)
GFR: 41.82 mL/min — ABNORMAL LOW (ref >60.00)
GLUCOSE: 138 mg/dL — AB (ref 70–99)
POTASSIUM: 4.1 meq/L (ref 3.5–5.1)
SODIUM: 135 meq/L (ref 135–145)
TOTAL PROTEIN: 6.6 g/dL (ref 6.0–8.3)

## 2014-01-03 LAB — TSH: TSH: 6.81 u[IU]/mL — AB (ref 0.35–5.50)

## 2014-01-03 LAB — HEMOGLOBIN A1C: Hgb A1c MFr Bld: 6.5 % (ref 4.6–6.5)

## 2014-01-03 NOTE — Telephone Encounter (Signed)
See notes on labs.  Order entry.

## 2014-01-04 ENCOUNTER — Telehealth: Payer: Self-pay | Admitting: Neurology

## 2014-01-04 NOTE — Telephone Encounter (Signed)
I called patient. The patient had a one-month history of nausea, queasiness that has worsened as the Sinemet dose is increased. The patient also reports that he feels as if solids are getting stuck in his throat. The patient has been set up for an esophageal study which is appropriate. I will also consider cutting back on the Sinemet in the meantime to help improve the nausea until this issue gets worked out. The patient is eating very little because of the nausea. The Sinemet 25/100 mg tablet will be cut back to one tablet 3 times daily.  I called the son, I asked him to cut back on Sinemet 25/100 mg tablets to 1 tablet 3 times daily. If the esophageal study does not show an etiology for the swallowing problems, we may need to make further medication adjustments. The patient may not be tolerating the Sinemet well.

## 2014-01-09 ENCOUNTER — Telehealth: Payer: Self-pay | Admitting: Family Medicine

## 2014-01-09 MED ORDER — ONDANSETRON HCL 4 MG PO TABS: 4.0000 mg | ORAL_TABLET | Freq: Three times a day (TID) | ORAL | Status: DC | PRN

## 2014-01-09 MED ORDER — HYDROCODONE-ACETAMINOPHEN 5-325 MG PO TABS: ORAL_TABLET | ORAL | Status: DC

## 2014-01-09 NOTE — Telephone Encounter (Signed)
Electronic refill request. Hydrocodone last filled:  30 tablets 0RF 12/13/2013.  Ondansetron (PA pending).

## 2014-01-09 NOTE — Telephone Encounter (Signed)
I called pt's family, Antony Haste took the call.  Pt with dec in sinemet dose recently.  His nausea may be some better, not resolved. I'll send more zofran to the pharmacy.   D/w Antony Haste re: comfort vs aggressive care.  He had questions re: IVF, hospice.  D/w him that if comfort is the goal, then hospice is reasonable. If concern for acute reversible dehydration that could potentially improve with IVF, then ER may be reasonable.  If able to take more PO with dec in sinemet, then this may not be an issue right now.   D/w him that if he was given IVF, it may worsen his condition, ie potential pulmonary edema, etc.  He'll check on patient today and notify us.   Reasonable to hold off on barium esophagram for now.  Will await update from family.

## 2014-01-09 NOTE — Telephone Encounter (Signed)
Rx placed in your In Box in your office for signature.

## 2014-01-09 NOTE — Telephone Encounter (Signed)
zofran sent, hydrocodone printed, I'll sign tomorrow at clinic.   If they need to pick it up sooner, please ask another MD to print and sign, then shred my unsigned rx. Thanks.

## 2014-01-10 ENCOUNTER — Telehealth: Payer: Self-pay | Admitting: Family Medicine

## 2014-01-10 NOTE — Telephone Encounter (Signed)
Prescription left up front for patient's son to pick up.

## 2014-01-10 NOTE — Telephone Encounter (Signed)
Called pt's son. Discussed options.  Pain med rx is signed.  They'll consider barium swallow and hospice referral.  Both are on hold for now, ie not cancelled/finalized, until they can have some more conversations as a family.  They'll update me with patient wishes and we'll go from there, either with/without the swallowing test and with/without hospice referral.    Routed to [facility] as a [place].

## 2014-01-16 ENCOUNTER — Telehealth: Payer: Self-pay

## 2014-01-16 ENCOUNTER — Encounter: Payer: Self-pay | Admitting: Cardiovascular Disease

## 2014-01-16 ENCOUNTER — Ambulatory Visit (INDEPENDENT_AMBULATORY_CARE_PROVIDER_SITE_OTHER): Payer: Medicare Other | Admitting: Cardiovascular Disease

## 2014-01-16 VITALS — BP 120/56 | HR 80 | Ht 69.0 in | Wt 143.0 lb

## 2014-01-16 DIAGNOSIS — I4891 Unspecified atrial fibrillation: Secondary | ICD-10-CM

## 2014-01-16 DIAGNOSIS — I739 Peripheral vascular disease, unspecified: Secondary | ICD-10-CM

## 2014-01-16 DIAGNOSIS — I2510 Atherosclerotic heart disease of native coronary artery without angina pectoris: Secondary | ICD-10-CM

## 2014-01-16 NOTE — Progress Notes (Signed)
History of Present Illness: 80 yo WM with history of CAD, PAF, PVD s/p bilateral iliac artery stenting in past by Dr. Albertine Patricia, HTN, Hyperlipidemia, DM, Parkinsons disease and CRI here today for cardiac follow up. He has been followed in the past by Dr. Olevia Perches. His cardiac history includes remote inferior MI and more recent stenting of the RCA in 2004. He has been stable from a CV perspective since that time. The patient has paroxysmal atrial fibrillation and is maintained on chronic anticoagulation with coumadin. He also has chronic renal insufficiency with creatinines in the range of 1.7. He has been followed in [facility] by Dr. Justin Mend. He has had issues with LE swelling that has been responsive to Lasix.  Echo 08/11/12 with vigorous LV function, LVEF 70-75% with mid cavity obliteration. Mild MS. He has PAD followed in [facility] by Dr. Kellie Simmering. At his visit in November 2013, I increased his Lasix to 40 mg po BID and stopped Norvasc as it was felt that may have been contributing to his LE swelling. I saw him last in August 2014 and no changes were made. He was seen in [facility] by Dr. Kellie Simmering December 2014 and ABI below 0.5 both legs, suggestion of moderate restenosis both iliac stents.   He is here today for cardiac follow up. No chest pain. He has had an overall decline in regards to his Parkinson's disease. He is having trouble swallowing solid foods and pills. He has stopped taking most pills including ASA and statin. Still taking Lopressor. He is having some SOB. No palpitations, near syncope or syncope. He has not been aware of any irregularities of his heart rhythm.   Primary Care Physician: Elsie Stain   Last Lipid Profile:Lipid Panel     Component Value Date/Time   CHOL 136 01/02/2014 1718   TRIG 110.0 01/02/2014 1718   HDL 62.70 01/02/2014 1718   CHOLHDL 2 01/02/2014 1718   VLDL 22.0 01/02/2014 1718   LDLCALC 51 01/02/2014 1718     Past Medical History  Diagnosis Date  . GERD (gastroesophageal reflux  disease)   . Hypothyroidism   . Anemia     Mild  . Hypertension   . Diabetes mellitus     Goal A1c ~8 due to concern for hypoglycemia  . Hyperlipidemia   . PVD (peripheral vascular disease)     Status post bilateral renal stenting, LE's  . Paroxysmal atrial fibrillation     On Coumadin and rate control meds  . CAD (coronary artery disease)     a. Inf MI 1997 tx with POBA;  b. s/p BMS to RCA for restonosis;  b. NSTEMI 9/04 => LHC 06/22/03:  LM 30%, LAD and CFX irregs, mRCA 80% ISR, EF 60% => PCI: Taxus DES to mRCA  . Hemorrhoids   . Gastritis 06/24/03 & 12/08/06    EGD Ardis Hughs)  . Hiatal hernia   . Gout   . Vitamin B 12 deficiency   . BPH (benign prostatic hyperplasia)   . Diverticular disease     via ACBE  . Herniated disc     L4, L5  . Near syncope 10/07/09  . Parkinson's disease     Per Dr. Krista Blue  . Chronic kidney disease     Chronic, with a creatinine in the range of 1.2-1.6;  hx of ARF 11/2009  . Renal calculus     Recurrent lithotripsy  . Hx of echocardiogram     a. Echo 10/2009:  mild LVH, EF 55-60%, b.  Echo 11/13:  mild LVH, EF 70-75%, mid cavity dynamic obstruction wth Valsalva (pk velocity 307 cm/sec, pk gradient 38 mmHg), mild MS, mean gradient 7 mmHg    Past Surgical History  Procedure Laterality Date  . Angioplasty  10/97    Multiple   . Lithotripsy  04/29/00    Due to renal lithiasis   . Cardiac catheterization  06/22/03    and PICA for in-stent restenosis  . Iliac vein angioplasty / stenting  05/16/04    Bilateral  . Laminectomy  09/28/07    L4,L5  (Dr. Lorin Mercy)  . Cholecystectomy open  10/23/2009    Attempted lap, purulent GB (Dr. Donne Hazel)  . Cataract extraction      right eye 2013    Current Outpatient Prescriptions  Medication Sig Dispense Refill  . carbidopa-levodopa (SINEMET CR) 50-200 MG per tablet Take 1 tablet by mouth at bedtime.  30 tablet  6  . cilostazol (PLETAL) 100 MG tablet take 1 tablet by mouth twice a day  60 tablet  12  . docusate  sodium (COLACE) 100 MG capsule Take 1 capsule (100 mg total) by mouth daily.  30 capsule  12  . donepezil (ARICEPT) 10 MG tablet Take 1 tablet (10 mg total) by mouth daily.  30 tablet  12  . dutasteride (AVODART) 0.5 MG capsule take 1 capsule by mouth once daily  30 capsule  12  . fluticasone (FLONASE) 50 MCG/ACT nasal spray Place 2 sprays into both nostrils daily.  16 g  12  . HYDROcodone-acetaminophen (NORCO/VICODIN) 5-325 MG per tablet TAKE 1/2 TO 1 TABLET BY MOUTH EVERY 6 HOURS AS NEEDED AS DIRECTED.  30 tablet  0  . levothyroxine (SYNTHROID, LEVOTHROID) 25 MCG tablet TAKE 1 TABLET BY MOUTH DAILY  30 tablet  12  . metoprolol tartrate (LOPRESSOR) 25 MG tablet Take 1 tablet (25 mg total) by mouth 2 (two) times daily.  60 tablet  12  . nitroGLYCERIN (NITROSTAT) 0.3 MG SL tablet Place 1 tablet (0.3 mg total) under the tongue every 5 (five) minutes as needed.  25 tablet  6  . ondansetron (ZOFRAN) 4 MG tablet Take 1 tablet (4 mg total) by mouth every 8 (eight) hours as needed for nausea or vomiting.  30 tablet  1  . aspirin 81 MG tablet Take 1 tablet (81 mg total) by mouth daily.  30 tablet  12  . atorvastatin (LIPITOR) 80 MG tablet take 1 tablet by mouth every evening  30 tablet  12  . Cholecalciferol 1000 UNITS capsule Take 1 capsule (1,000 Units total) by mouth daily.  30 capsule  12  . cyanocobalamin 500 MCG tablet Take 2 tablets (1,000 mcg total) by mouth daily.  60 tablet  12  . potassium chloride (K-DUR,KLOR-CON) 10 MEQ tablet Take 1 tablet (10 mEq total) by mouth once.  30 tablet  12   No current facility-administered medications for this visit.    Allergies  Allergen Reactions  . Codeine Sulfate     REACTION: nausea and vomiting    History   Social History  . Marital Status: Married    Spouse Name: Patsy    Number of Children: 3  . Years of Education: 12   Occupational History  . Retired, [employer]   .     Social History Main Topics  . Smoking status:  Former Smoker    Types: Cigarettes    Quit date: 11/11/1986  . Smokeless tobacco: Never Used  Comment: Quit after MI  . Alcohol Use: No  . Drug Use: No  . Sexual Activity: Not Currently   Other Topics Concern  . 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   Education- High school   Right handed.   Caffeine- one cup of coffee daily.      Patients son was with his at visit today Evalyn Casco)    Family History  Problem Relation Age of Onset  . Heart disease Mother   . Cancer Father     ? Skin  . Diabetes Sister   . Heart disease Brother   . Heart disease Brother     Review of Systems:  As stated in the HPI and otherwise negative.   BP 120/56  Pulse 80  Ht 5\' 9"  (1.753 m)  Wt 143 lb (64.864 kg)  BMI 21.11 kg/m2  Physical Examination: General: Well developed, well nourished, NAD HEENT: OP clear, mucus membranes moist SKIN: warm, dry. No rashes. Neuro: No focal deficits Musculoskeletal: Muscle strength 5/5 all ext Psychiatric: Mood and affect normal Neck: No JVD, no carotid bruits, no thyromegaly, no lymphadenopathy. Lungs:Clear bilaterally, no wheezes, rhonci, crackles Cardiovascular: Regular rate and rhythm. No murmurs, gallops or rubs. Abdomen:Soft. Bowel sounds present. Non-tender.  Extremities: 1+ bilateral lower extremity edema. Pulses are 2 + in the bilateral DP/PT.  EKG: NSR, rate 80 bpm. Poor R wave progression precordial leads.   Assessment and Plan:   1. CAD: Stable. No changes today. Given his advanced Parkinson's disease, will likely be conservative with future invasive cardiac procedures.   2. PAD: He has at least moderate disease in both legs but this is stable.  No rest pain or active ulcerations.  He has been seen in [facility].   3. Paroxysmal atrial fibrillation: Sinus today.  His coumadin has been stopped. Continue beta blocker

## 2014-01-16 NOTE — Patient Instructions (Signed)
Your physician wants you to follow-up in:  12 months.  You will receive a reminder letter in the mail two months in advance. If you don't receive a letter, please call our office to schedule the follow-up appointment.   

## 2014-01-16 NOTE — Telephone Encounter (Signed)
Relevant patient education assigned to patient using Emmi. ° °

## 2014-01-17 NOTE — Telephone Encounter (Signed)
Message copied by Marcial Pacas on Wed Jan 17, 2014  5:19 PM ------      Message from: Tonia Ghent      Created: Tue Jan 02, 2014 11:57 PM       Please take a look at my note.  His parkinson's sx are increased.              I don't know if his carbidopa could be increased.              He sounds to have a lower esophageal stricture.  I don't know if he could tolerate an EGD.              I welcome your input.  Thanks to both of you.             Brigitte Pulse       ------

## 2014-01-17 NOTE — Telephone Encounter (Signed)
Hinton Dyer, please give him an appt in next available.

## 2014-01-18 NOTE — Telephone Encounter (Signed)
Called patients son and left him a message stating to give the office a call back to get his father scheduled for appt. With Dr.Yan sometime soon.

## 2014-01-23 NOTE — Telephone Encounter (Signed)
Patient called back he has appt. With Dr.Yan 01-24-2014.

## 2014-01-24 ENCOUNTER — Inpatient Hospital Stay (HOSPITAL_COMMUNITY)
Admission: EM | Admit: 2014-01-24 | Discharge: 2014-01-26 | DRG: 177 | Disposition: A | Discharge status: Hospice | Payer: Medicare Other | Attending: Internal Medicine | Admitting: Internal Medicine

## 2014-01-24 ENCOUNTER — Emergency Department (HOSPITAL_COMMUNITY): Payer: Medicare Other

## 2014-01-24 ENCOUNTER — Encounter (HOSPITAL_COMMUNITY): Payer: Self-pay | Admitting: Emergency Medicine

## 2014-01-24 ENCOUNTER — Ambulatory Visit: Payer: Medicare Other | Admitting: Neurology

## 2014-01-24 DIAGNOSIS — Z66 Do not resuscitate: Secondary | ICD-10-CM | POA: Diagnosis present

## 2014-01-24 DIAGNOSIS — K449 Diaphragmatic hernia without obstruction or gangrene: Secondary | ICD-10-CM

## 2014-01-24 DIAGNOSIS — R109 Unspecified abdominal pain: Secondary | ICD-10-CM

## 2014-01-24 DIAGNOSIS — K2990 Gastroduodenitis, unspecified, without bleeding: Secondary | ICD-10-CM

## 2014-01-24 DIAGNOSIS — E039 Hypothyroidism, unspecified: Secondary | ICD-10-CM

## 2014-01-24 DIAGNOSIS — I472 Ventricular tachycardia: Secondary | ICD-10-CM

## 2014-01-24 DIAGNOSIS — Z9861 Coronary angioplasty status: Secondary | ICD-10-CM

## 2014-01-24 DIAGNOSIS — K7689 Other specified diseases of liver: Secondary | ICD-10-CM

## 2014-01-24 DIAGNOSIS — R627 Adult failure to thrive: Secondary | ICD-10-CM | POA: Diagnosis present

## 2014-01-24 DIAGNOSIS — E43 Unspecified severe protein-calorie malnutrition: Secondary | ICD-10-CM

## 2014-01-24 DIAGNOSIS — K2970 Gastritis, unspecified, without bleeding: Secondary | ICD-10-CM

## 2014-01-24 DIAGNOSIS — F329 Major depressive disorder, single episode, unspecified: Secondary | ICD-10-CM

## 2014-01-24 DIAGNOSIS — G20 Parkinson's disease: Secondary | ICD-10-CM

## 2014-01-24 DIAGNOSIS — R443 Hallucinations, unspecified: Secondary | ICD-10-CM | POA: Diagnosis present

## 2014-01-24 DIAGNOSIS — I10 Essential (primary) hypertension: Secondary | ICD-10-CM

## 2014-01-24 DIAGNOSIS — Z681 Body mass index (BMI) 19 or less, adult: Secondary | ICD-10-CM

## 2014-01-24 DIAGNOSIS — I129 Hypertensive chronic kidney disease with stage 1 through stage 4 chronic kidney disease, or unspecified chronic kidney disease: Secondary | ICD-10-CM | POA: Diagnosis present

## 2014-01-24 DIAGNOSIS — F32A Depression, unspecified: Secondary | ICD-10-CM

## 2014-01-24 DIAGNOSIS — E119 Type 2 diabetes mellitus without complications: Secondary | ICD-10-CM | POA: Diagnosis present

## 2014-01-24 DIAGNOSIS — E669 Obesity, unspecified: Secondary | ICD-10-CM

## 2014-01-24 DIAGNOSIS — G319 Degenerative disease of nervous system, unspecified: Secondary | ICD-10-CM

## 2014-01-24 DIAGNOSIS — E860 Dehydration: Secondary | ICD-10-CM

## 2014-01-24 DIAGNOSIS — R0602 Shortness of breath: Secondary | ICD-10-CM

## 2014-01-24 DIAGNOSIS — Z7189 Other specified counseling: Secondary | ICD-10-CM

## 2014-01-24 DIAGNOSIS — E1149 Type 2 diabetes mellitus with other diabetic neurological complication: Secondary | ICD-10-CM

## 2014-01-24 DIAGNOSIS — R112 Nausea with vomiting, unspecified: Secondary | ICD-10-CM

## 2014-01-24 DIAGNOSIS — I219 Acute myocardial infarction, unspecified: Secondary | ICD-10-CM

## 2014-01-24 DIAGNOSIS — M109 Gout, unspecified: Secondary | ICD-10-CM

## 2014-01-24 DIAGNOSIS — R42 Dizziness and giddiness: Secondary | ICD-10-CM

## 2014-01-24 DIAGNOSIS — N183 Chronic kidney disease, stage 3 unspecified: Secondary | ICD-10-CM | POA: Diagnosis present

## 2014-01-24 DIAGNOSIS — M25559 Pain in unspecified hip: Secondary | ICD-10-CM

## 2014-01-24 DIAGNOSIS — R609 Edema, unspecified: Secondary | ICD-10-CM

## 2014-01-24 DIAGNOSIS — Z7901 Long term (current) use of anticoagulants: Secondary | ICD-10-CM

## 2014-01-24 DIAGNOSIS — G968 Other specified disorders of central nervous system: Secondary | ICD-10-CM

## 2014-01-24 DIAGNOSIS — R5381 Other malaise: Secondary | ICD-10-CM

## 2014-01-24 DIAGNOSIS — N189 Chronic kidney disease, unspecified: Secondary | ICD-10-CM

## 2014-01-24 DIAGNOSIS — I4729 Other ventricular tachycardia: Secondary | ICD-10-CM

## 2014-01-24 DIAGNOSIS — G8929 Other chronic pain: Secondary | ICD-10-CM | POA: Diagnosis present

## 2014-01-24 DIAGNOSIS — E1351 Other specified diabetes mellitus with diabetic peripheral angiopathy without gangrene: Secondary | ICD-10-CM

## 2014-01-24 DIAGNOSIS — L98499 Non-pressure chronic ulcer of skin of other sites with unspecified severity: Secondary | ICD-10-CM

## 2014-01-24 DIAGNOSIS — K649 Unspecified hemorrhoids: Secondary | ICD-10-CM

## 2014-01-24 DIAGNOSIS — M549 Dorsalgia, unspecified: Secondary | ICD-10-CM

## 2014-01-24 DIAGNOSIS — R1310 Dysphagia, unspecified: Secondary | ICD-10-CM

## 2014-01-24 DIAGNOSIS — N259 Disorder resulting from impaired renal tubular function, unspecified: Secondary | ICD-10-CM

## 2014-01-24 DIAGNOSIS — Z789 Other specified health status: Secondary | ICD-10-CM

## 2014-01-24 DIAGNOSIS — I4891 Unspecified atrial fibrillation: Secondary | ICD-10-CM

## 2014-01-24 DIAGNOSIS — G3183 Dementia with Lewy bodies: Secondary | ICD-10-CM

## 2014-01-24 DIAGNOSIS — K219 Gastro-esophageal reflux disease without esophagitis: Secondary | ICD-10-CM

## 2014-01-24 DIAGNOSIS — I498 Other specified cardiac arrhythmias: Secondary | ICD-10-CM

## 2014-01-24 DIAGNOSIS — E538 Deficiency of other specified B group vitamins: Secondary | ICD-10-CM

## 2014-01-24 DIAGNOSIS — F0280 Dementia in other diseases classified elsewhere without behavioral disturbance: Secondary | ICD-10-CM | POA: Diagnosis present

## 2014-01-24 DIAGNOSIS — R569 Unspecified convulsions: Secondary | ICD-10-CM

## 2014-01-24 DIAGNOSIS — Z79899 Other long term (current) drug therapy: Secondary | ICD-10-CM

## 2014-01-24 DIAGNOSIS — Z7401 Bed confinement status: Secondary | ICD-10-CM

## 2014-01-24 DIAGNOSIS — G20A1 Parkinson's disease without dyskinesia, without mention of fluctuations: Secondary | ICD-10-CM

## 2014-01-24 DIAGNOSIS — N179 Acute kidney failure, unspecified: Secondary | ICD-10-CM | POA: Diagnosis present

## 2014-01-24 DIAGNOSIS — I739 Peripheral vascular disease, unspecified: Secondary | ICD-10-CM

## 2014-01-24 DIAGNOSIS — I252 Old myocardial infarction: Secondary | ICD-10-CM

## 2014-01-24 DIAGNOSIS — Z87891 Personal history of nicotine dependence: Secondary | ICD-10-CM

## 2014-01-24 DIAGNOSIS — D649 Anemia, unspecified: Secondary | ICD-10-CM

## 2014-01-24 DIAGNOSIS — N400 Enlarged prostate without lower urinary tract symptoms: Secondary | ICD-10-CM | POA: Diagnosis present

## 2014-01-24 DIAGNOSIS — R238 Other skin changes: Secondary | ICD-10-CM

## 2014-01-24 DIAGNOSIS — J189 Pneumonia, unspecified organism: Secondary | ICD-10-CM | POA: Diagnosis present

## 2014-01-24 DIAGNOSIS — I701 Atherosclerosis of renal artery: Secondary | ICD-10-CM

## 2014-01-24 DIAGNOSIS — L98429 Non-pressure chronic ulcer of back with unspecified severity: Secondary | ICD-10-CM

## 2014-01-24 DIAGNOSIS — R5383 Other fatigue: Secondary | ICD-10-CM

## 2014-01-24 DIAGNOSIS — R634 Abnormal weight loss: Secondary | ICD-10-CM

## 2014-01-24 DIAGNOSIS — E785 Hyperlipidemia, unspecified: Secondary | ICD-10-CM

## 2014-01-24 DIAGNOSIS — C4492 Squamous cell carcinoma of skin, unspecified: Secondary | ICD-10-CM

## 2014-01-24 DIAGNOSIS — Z87898 Personal history of other specified conditions: Secondary | ICD-10-CM

## 2014-01-24 DIAGNOSIS — J690 Pneumonitis due to inhalation of food and vomit: Principal | ICD-10-CM | POA: Diagnosis present

## 2014-01-24 DIAGNOSIS — J309 Allergic rhinitis, unspecified: Secondary | ICD-10-CM

## 2014-01-24 DIAGNOSIS — R413 Other amnesia: Secondary | ICD-10-CM

## 2014-01-24 DIAGNOSIS — Z515 Encounter for palliative care: Secondary | ICD-10-CM

## 2014-01-24 DIAGNOSIS — I2510 Atherosclerotic heart disease of native coronary artery without angina pectoris: Secondary | ICD-10-CM

## 2014-01-24 DIAGNOSIS — G9340 Encephalopathy, unspecified: Secondary | ICD-10-CM

## 2014-01-24 DIAGNOSIS — R932 Abnormal findings on diagnostic imaging of liver and biliary tract: Secondary | ICD-10-CM

## 2014-01-24 HISTORY — DX: Cardiac arrhythmia, unspecified: I49.9

## 2014-01-24 HISTORY — DX: Pneumonia, unspecified organism: J18.9

## 2014-01-24 LAB — COMPREHENSIVE METABOLIC PANEL
ALK PHOS: 71 U/L (ref 39–117)
AST: 13 U/L (ref 0–37)
Albumin: 3.4 g/dL — ABNORMAL LOW (ref 3.5–5.2)
BILIRUBIN TOTAL: 0.9 mg/dL (ref 0.3–1.2)
BUN: 51 mg/dL — ABNORMAL HIGH (ref 6–23)
CALCIUM: 9.3 mg/dL (ref 8.4–10.5)
CO2: 20 meq/L (ref 19–32)
Chloride: 97 mEq/L (ref 96–112)
Creatinine, Ser: 1.91 mg/dL — ABNORMAL HIGH (ref 0.50–1.35)
GFR calc non Af Amer: 31 mL/min — ABNORMAL LOW (ref >90)
GFR, EST AFRICAN AMERICAN: 36 mL/min — AB (ref >90)
GLUCOSE: 125 mg/dL — AB (ref 70–99)
Potassium: 3.8 mEq/L (ref 3.7–5.3)
SODIUM: 139 meq/L (ref 137–147)
Total Protein: 6.9 g/dL (ref 6.0–8.3)

## 2014-01-24 LAB — URINE MICROSCOPIC-ADD ON

## 2014-01-24 LAB — URINALYSIS, ROUTINE W REFLEX MICROSCOPIC
Glucose, UA: NEGATIVE mg/dL
Hgb urine dipstick: NEGATIVE
KETONES UR: 15 mg/dL — AB
Leukocytes, UA: NEGATIVE
Nitrite: NEGATIVE
Protein, ur: >300 mg/dL — AB
SPECIFIC GRAVITY, URINE: 1.028 (ref 1.005–1.030)
UROBILINOGEN UA: 1 mg/dL (ref 0.0–1.0)
pH: 5 (ref 5.0–8.0)

## 2014-01-24 LAB — CBC
HCT: 33 % — ABNORMAL LOW (ref 39.0–52.0)
HCT: 27.4 % — ABNORMAL LOW (ref 39.0–52.0)
HEMOGLOBIN: 10.7 g/dL — AB (ref 13.0–17.0)
HEMOGLOBIN: 8.8 g/dL — AB (ref 13.0–17.0)
MCH: 27.7 pg (ref 26.0–34.0)
MCH: 27.6 pg (ref 26.0–34.0)
MCHC: 32.4 g/dL (ref 30.0–36.0)
MCHC: 32.1 g/dL (ref 30.0–36.0)
MCV: 85.5 fL (ref 78.0–100.0)
MCV: 85.9 fL (ref 78.0–100.0)
Platelets: 201 10*3/uL (ref 150–400)
Platelets: 185 10*3/uL (ref 150–400)
RBC: 3.86 MIL/uL — AB (ref 4.22–5.81)
RBC: 3.19 MIL/uL — AB (ref 4.22–5.81)
RDW: 15.9 % — ABNORMAL HIGH (ref 11.5–15.5)
RDW: 16 % — ABNORMAL HIGH (ref 11.5–15.5)
WBC: 7.6 10*3/uL (ref 4.0–10.5)
WBC: 7.7 10*3/uL (ref 4.0–10.5)

## 2014-01-24 LAB — CREATININE, SERUM
CREATININE: 1.87 mg/dL — AB (ref 0.50–1.35)
GFR calc Af Amer: 37 mL/min — ABNORMAL LOW (ref >90)
GFR, EST NON AFRICAN AMERICAN: 32 mL/min — AB (ref >90)

## 2014-01-24 LAB — GLUCOSE, CAPILLARY: GLUCOSE-CAPILLARY: 92 mg/dL (ref 70–99)

## 2014-01-24 MED ORDER — MORPHINE SULFATE 2 MG/ML IJ SOLN: 1.0000 mg | INTRAMUSCULAR | Status: DC | PRN

## 2014-01-24 MED ORDER — DUTASTERIDE 0.5 MG PO CAPS
0.5000 mg | ORAL_CAPSULE | Freq: Every day | ORAL | Status: DC
  Administered 2014-01-24: 0.5 mg via ORAL
  Filled 2014-01-24 (×2): qty 1

## 2014-01-24 MED ORDER — LEVOTHYROXINE SODIUM 25 MCG PO TABS
25.0000 ug | ORAL_TABLET | Freq: Every day | ORAL | Status: DC
  Filled 2014-01-24 (×2): qty 1

## 2014-01-24 MED ORDER — CILOSTAZOL 100 MG PO TABS
100.0000 mg | ORAL_TABLET | Freq: Two times a day (BID) | ORAL | Status: DC
  Filled 2014-01-24 (×3): qty 1

## 2014-01-24 MED ORDER — DEXTROSE 5 % IV SOLN
1.0000 g | INTRAVENOUS | Status: DC
  Filled 2014-01-24: qty 10

## 2014-01-24 MED ORDER — HYDROCODONE-ACETAMINOPHEN 5-325 MG PO TABS
0.5000 | ORAL_TABLET | Freq: Four times a day (QID) | ORAL | Status: DC | PRN
  Administered 2014-01-24: 0.5 via ORAL
  Filled 2014-01-24: qty 1

## 2014-01-24 MED ORDER — NITROGLYCERIN 0.3 MG SL SUBL
0.3000 mg | SUBLINGUAL_TABLET | SUBLINGUAL | Status: DC | PRN
  Filled 2014-01-24: qty 100

## 2014-01-24 MED ORDER — DOCUSATE SODIUM 100 MG PO CAPS
100.0000 mg | ORAL_CAPSULE | Freq: Every day | ORAL | Status: DC
  Filled 2014-01-24: qty 1

## 2014-01-24 MED ORDER — AZITHROMYCIN 500 MG IV SOLR
500.0000 mg | Freq: Once | INTRAVENOUS | Status: AC
  Administered 2014-01-24: 500 mg via INTRAVENOUS

## 2014-01-24 MED ORDER — METOPROLOL TARTRATE 25 MG PO TABS
25.0000 mg | ORAL_TABLET | Freq: Two times a day (BID) | ORAL | Status: DC
  Filled 2014-01-24 (×3): qty 1

## 2014-01-24 MED ORDER — ONDANSETRON HCL 4 MG/2ML IJ SOLN
4.0000 mg | Freq: Four times a day (QID) | INTRAMUSCULAR | Status: DC
  Administered 2014-01-24 – 2014-01-25 (×3): 4 mg via INTRAVENOUS
  Filled 2014-01-24 (×3): qty 2

## 2014-01-24 MED ORDER — SODIUM CHLORIDE 0.9 % IV SOLN
INTRAVENOUS | Status: DC
Start: 2014-01-24 — End: 2014-01-25
  Administered 2014-01-24: 500 mL via INTRAVENOUS

## 2014-01-24 MED ORDER — CARBIDOPA-LEVODOPA ER 50-200 MG PO TBCR
1.0000 | EXTENDED_RELEASE_TABLET | Freq: Every day | ORAL | Status: DC
  Filled 2014-01-24 (×3): qty 1

## 2014-01-24 MED ORDER — ONDANSETRON HCL 4 MG/2ML IJ SOLN
4.0000 mg | Freq: Once | INTRAMUSCULAR | Status: AC
  Administered 2014-01-24: 4 mg via INTRAVENOUS
  Filled 2014-01-24: qty 2

## 2014-01-24 MED ORDER — HYDRALAZINE HCL 20 MG/ML IJ SOLN
5.0000 mg | Freq: Four times a day (QID) | INTRAMUSCULAR | Status: DC | PRN
  Administered 2014-01-25: 5 mg via INTRAVENOUS
  Filled 2014-01-24 (×3): qty 1

## 2014-01-24 MED ORDER — DONEPEZIL HCL 10 MG PO TABS
10.0000 mg | ORAL_TABLET | Freq: Every day | ORAL | Status: DC
  Administered 2014-01-24: 10 mg via ORAL
  Filled 2014-01-24 (×2): qty 1

## 2014-01-24 MED ORDER — ENOXAPARIN SODIUM 30 MG/0.3ML ~~LOC~~ SOLN
30.0000 mg | SUBCUTANEOUS | Status: DC
  Administered 2014-01-24: 30 mg via SUBCUTANEOUS
  Filled 2014-01-24 (×2): qty 0.3

## 2014-01-24 MED ORDER — DEXTROSE 5 % IV SOLN
500.0000 mg | INTRAVENOUS | Status: DC
  Filled 2014-01-24: qty 500

## 2014-01-24 MED ORDER — PRO-STAT SUGAR FREE PO LIQD
30.0000 mL | Freq: Three times a day (TID) | ORAL | Status: DC
  Filled 2014-01-24 (×3): qty 30

## 2014-01-24 MED ORDER — CEFTRIAXONE SODIUM 1 G IJ SOLR
1.0000 g | Freq: Once | INTRAMUSCULAR | Status: AC
Start: 2014-01-24 — End: 2014-01-24
  Administered 2014-01-24: 1 g via INTRAVENOUS
  Filled 2014-01-24: qty 10

## 2014-01-24 MED ORDER — CARBIDOPA-LEVODOPA 25-100 MG PO TABS
1.0000 | ORAL_TABLET | Freq: Three times a day (TID) | ORAL | Status: DC
  Filled 2014-01-24 (×7): qty 1

## 2014-01-24 NOTE — Progress Notes (Signed)
Pharmacy was consulted to renally adjust ABX. Current ABX - Azithromycin and Ceftriaxone do not require dose adjustment.  Pharmacy to sign off.  Please reconsult if needed. Thanks Bonnita Nasuti Pharm.D. CPP, BCPS Clinical Pharmacist [phone] 01/24/2014 5:27 PM

## 2014-01-24 NOTE — Progress Notes (Addendum)
NURSING PROGRESS NOTE  KINNIE KAUPP [id number] Admission Data: 01/24/2014 5:16 PM Attending Provider: Cristal Ford, DO BZJ:IRCVEL Damita Dunnings, MD Code Status: DNR  SHAWNDELL Lane is a 80 y.o. male patient admitted from ED:  -No acute distress noted.  -No complaints of shortness of breath.  -No complaints of chest pain.    Blood pressure 165/74, pulse 84, temperature 97.9 F (36.6 C), temperature source Axillary, resp. rate 18, SpO2 100.00%.   IV Fluids:  IV in place, occlusive dsg intact without redness, IV cath antecubital left, condition patent and no redness normal saline.   Allergies:  Codeine sulfate  Past Medical History:   has a past medical history of GERD (gastroesophageal reflux disease); Hypothyroidism; Anemia; Hypertension; Diabetes mellitus; Hyperlipidemia; PVD (peripheral vascular disease); Paroxysmal atrial fibrillation; CAD (coronary artery disease); Hemorrhoids; Gastritis (06/24/03 & 12/08/06); Hiatal hernia; Gout; Vitamin B 12 deficiency; BPH (benign prostatic hyperplasia); Diverticular disease; Herniated disc; Near syncope (10/07/09); Parkinson's disease; Chronic kidney disease; Renal calculus; and echocardiogram.  Past Surgical History:   has past surgical history that includes Angioplasty (10/97); Lithotripsy (04/29/00); Cardiac catheterization (06/22/03); Iliac vein angioplasty / stenting (05/16/04); Laminectomy (09/28/07); Cholecystectomy open (10/23/2009); and Cataract extraction.  Social History:   reports that he quit smoking about 27 years ago. His smoking use included Cigarettes. He smoked 0.00 packs per day. He has never used smokeless tobacco. He reports that he does not drink alcohol or use illicit drugs.  Skin: scattered bruises arms/hands, stage 2 to sacrum.   Patient/Family orientated to room. Information packet given to patient/family. Admission inpatient armband information verified with patient/family to include name and date of birth and placed on patient  arm. Side rails up x 2, fall assessment and education completed with patient/family. Patient/family able to verbalize understanding of risk associated with falls and verbalized understanding to call for assistance before getting out of bed. Call light within reach. Patient/family able to voice and demonstrate understanding of unit orientation instructions.    Will continue to evaluate and treat per MD orders.

## 2014-01-24 NOTE — ED Notes (Signed)
Warm blankets placed to patient

## 2014-01-24 NOTE — ED Notes (Signed)
Provider at the bedside.  

## 2014-01-24 NOTE — Progress Notes (Signed)
Attempt to get report from ED.

## 2014-01-24 NOTE — ED Notes (Signed)
Per [facility], pt was hallucinating yesterday, hx of parkinson's, not eating, nausea. Reports having a DNR but no form. Pt alert to place and person. VSS.

## 2014-01-24 NOTE — ED Notes (Signed)
Dr. Tawnya Crook at the bedside. Family at the bedside.

## 2014-01-24 NOTE — ED Provider Notes (Signed)
CSN: [id number]     Arrival date & time 01/24/14  0844 History   First MD Initiated Contact with Patient 01/24/14 0848     Chief Complaint  Patient presents with  . Altered Mental Status  . Nausea     (Consider location/radiation/quality/duration/timing/severity/associated sxs/prior Treatment) HPI Comments: Pt is a 80 y.o. male with Pmhx as above including parkinson's who presents with dec LOC/AMS.  Family report he has not been eating or drinking much in about 1 month, choking/coughing with PO intake. He has been complaining of nausea, was hallucinating yesterday and had dec responsiveness this morning. Wife also reports about 2-3 mins of contorting facial movements with dec responsiveness following the episode this morning.   Patient is a 80 y.o. male presenting with altered mental status. The history is provided by the patient, the spouse and a relative. The history is limited by the condition of the patient. No language interpreter was used.  Altered Mental Status Presenting symptoms: partial responsiveness   Presenting symptoms: no confusion   Severity:  Severe Most recent episode:  Yesterday Episode history:  Single Timing:  Constant Progression:  Waxing and waning Chronicity:  New Context: dementia   Context comment:  Parkinson's Associated symptoms: abnormal movement, nausea and weakness   Associated symptoms: no abdominal pain, no agitation, no difficulty breathing, no fever, no headaches, no rash and no vomiting   Nausea:    Severity:  Unable to specify   Onset quality:  Unable to specify   Duration:  2 days   Timing:  Unable to specify   Progression:  Unable to specify   Past Medical History  Diagnosis Date  . GERD (gastroesophageal reflux disease)   . Hypothyroidism   . Anemia     Mild  . Hypertension   . Diabetes mellitus     Goal A1c ~8 due to concern for hypoglycemia  . Hyperlipidemia   . PVD (peripheral vascular disease)     Status post bilateral renal  stenting, LE's  . Paroxysmal atrial fibrillation     On Coumadin and rate control meds  . CAD (coronary artery disease)     a. Inf MI 1997 tx with POBA;  b. s/p BMS to RCA for restonosis;  b. NSTEMI 9/04 => LHC 06/22/03:  LM 30%, LAD and CFX irregs, mRCA 80% ISR, EF 60% => PCI: Taxus DES to mRCA  . Hemorrhoids   . Gastritis 06/24/03 & 12/08/06    EGD Ardis Hughs)  . Hiatal hernia   . Gout   . Vitamin B 12 deficiency   . BPH (benign prostatic hyperplasia)   . Diverticular disease     via ACBE  . Herniated disc     L4, L5  . Near syncope 10/07/09  . Parkinson's disease     Per Dr. Krista Blue  . Chronic kidney disease     Chronic, with a creatinine in the range of 1.2-1.6;  hx of ARF 11/2009  . Renal calculus     Recurrent lithotripsy  . Hx of echocardiogram     a. Echo 10/2009:  mild LVH, EF 55-60%, b.  Echo 11/13:  mild LVH, EF 70-75%, mid cavity dynamic obstruction wth Valsalva (pk velocity 307 cm/sec, pk gradient 38 mmHg), mild MS, mean gradient 7 mmHg  . Myocardial infarction   . Dysrhythmia     HX OF ATRIAL FIBRILATION  . Pneumonia 01/24/2014   Past Surgical History  Procedure Laterality Date  . Angioplasty  10/97    Multiple   .  Lithotripsy  04/29/00    Due to renal lithiasis   . Cardiac catheterization  06/22/03    and PICA for in-stent restenosis  . Iliac vein angioplasty / stenting  05/16/04    Bilateral  . Laminectomy  09/28/07    L4,L5  (Dr. Lorin Mercy)  . Cholecystectomy open  10/23/2009    Attempted lap, purulent GB (Dr. Donne Hazel)  . Cataract extraction      right eye 2013   Family History  Problem Relation Age of Onset  . Heart disease Mother   . Cancer Father     ? Skin  . Diabetes Sister   . Heart disease Brother   . Heart disease Brother    History  Substance Use Topics  . Smoking status: Former Smoker    Types: Cigarettes    Quit date: 11/11/1986  . Smokeless tobacco: Never Used     Comment: Quit after MI  . Alcohol Use: No    Review of Systems   Constitutional: Positive for activity change, appetite change and fatigue. Negative for fever.  HENT: Negative for congestion, facial swelling, rhinorrhea and trouble swallowing.   Eyes: Negative for photophobia and pain.  Respiratory: Negative for cough, chest tightness and shortness of breath.   Cardiovascular: Negative for chest pain and leg swelling.  Gastrointestinal: Positive for nausea. Negative for vomiting, abdominal pain, diarrhea and constipation.  Endocrine: Negative for polydipsia and polyuria.  Genitourinary: Negative for dysuria, urgency, decreased urine volume and difficulty urinating.  Musculoskeletal: Negative for back pain and gait problem.  Skin: Negative for color change, rash and wound.  Allergic/Immunologic: Negative for immunocompromised state.  Neurological: Positive for weakness. Negative for dizziness, facial asymmetry, speech difficulty, numbness and headaches.  Psychiatric/Behavioral: Negative for confusion, decreased concentration and agitation.      Allergies  Codeine sulfate  Home Medications   Prior to Admission medications   Medication Sig Start Date End Date Taking? Authorizing Provider  Amino Acids-Protein Hydrolys (FEEDING SUPPLEMENT, PRO-STAT SUGAR FREE 64,) LIQD Take 30 mLs by mouth 3 (three) times daily with meals.   Yes Historical Provider, MD  carbidopa-levodopa (SINEMET CR) 50-200 MG per tablet Take 1 tablet by mouth at bedtime. 12/11/13  Yes Marcial Pacas, MD  carbidopa-levodopa (SINEMET IR) 25-100 MG per tablet Take 1 tablet by mouth 3 (three) times daily.   Yes Historical Provider, MD  cilostazol (PLETAL) 100 MG tablet take 1 tablet by mouth twice a day 10/03/13  Yes Tonia Ghent, MD  docusate sodium (COLACE) 100 MG capsule Take 1 capsule (100 mg total) by mouth daily. 10/03/13  Yes Tonia Ghent, MD  donepezil (ARICEPT) 10 MG tablet Take 1 tablet (10 mg total) by mouth daily. 10/03/13  Yes Tonia Ghent, MD  dutasteride (AVODART) 0.5 MG  capsule take 1 capsule by mouth once daily 10/03/13  Yes Tonia Ghent, MD  HYDROcodone-acetaminophen (NORCO/VICODIN) 5-325 MG per tablet TAKE 1/2 TO 1 TABLET BY MOUTH EVERY 6 HOURS AS NEEDED AS DIRECTED. 01/09/14  Yes Tonia Ghent, MD  levothyroxine (SYNTHROID, LEVOTHROID) 25 MCG tablet TAKE 1 TABLET BY MOUTH DAILY 10/03/13  Yes Tonia Ghent, MD  metoprolol tartrate (LOPRESSOR) 25 MG tablet Take 1 tablet (25 mg total) by mouth 2 (two) times daily. 10/03/13  Yes Tonia Ghent, MD  nitroGLYCERIN (NITROSTAT) 0.3 MG SL tablet Place 1 tablet (0.3 mg total) under the tongue every 5 (five) minutes as needed. 10/03/13  Yes Tonia Ghent, MD  ondansetron (ZOFRAN) 4 MG tablet  Take 1 tablet (4 mg total) by mouth every 8 (eight) hours as needed for nausea or vomiting. 01/09/14  Yes Tonia Ghent, MD   BP 158/64  Pulse 76  Temp(Src) 97.7 F (36.5 C) (Oral)  Resp 20  Ht 6' (1.829 m)  Wt 63 lb 11.2 oz (28.894 kg)  BMI 8.64 kg/m2  SpO2 97% Physical Exam  Constitutional: He is oriented to person, place, and time. He appears well-developed and well-nourished. No distress.  HENT:  Head: Normocephalic and atraumatic.  Mouth/Throat: No oropharyngeal exudate.  Eyes: Pupils are equal, round, and reactive to light.  Neck: Normal range of motion. Neck supple.  Cardiovascular: Normal rate, regular rhythm and normal heart sounds.  Exam reveals no gallop and no friction rub.   No murmur heard. Pulmonary/Chest: Effort normal and breath sounds normal. No respiratory distress. He has no wheezes. He has no rales.  Abdominal: Soft. Bowel sounds are normal. He exhibits no distension and no mass. There is tenderness in the suprapubic area. There is no rigidity, no rebound and no guarding.  Musculoskeletal: Normal range of motion. He exhibits no edema and no tenderness.  Neurological: He is alert and oriented to person, place, and time.  Skin: Skin is warm and dry.  Psychiatric: He has a normal mood and affect.     ED Course  Procedures (including critical care time) Labs Review Labs Reviewed  CBC - Abnormal; Notable for the following:    RBC 3.86 (*)    Hemoglobin 10.7 (*)    HCT 33.0 (*)    RDW 15.9 (*)    All other components within normal limits  COMPREHENSIVE METABOLIC PANEL - Abnormal; Notable for the following:    Glucose, Bld 125 (*)    BUN 51 (*)    Creatinine, Ser 1.91 (*)    Albumin 3.4 (*)    GFR calc non Af Amer 31 (*)    GFR calc Af Amer 36 (*)    All other components within normal limits  URINALYSIS, ROUTINE W REFLEX MICROSCOPIC - Abnormal; Notable for the following:    APPearance CLOUDY (*)    Bilirubin Urine SMALL (*)    Ketones, ur 15 (*)    Protein, ur >300 (*)    All other components within normal limits  URINE MICROSCOPIC-ADD ON - Abnormal; Notable for the following:    Bacteria, UA FEW (*)    Casts HYALINE CASTS (*)    All other components within normal limits  CBC - Abnormal; Notable for the following:    RBC 3.19 (*)    Hemoglobin 8.8 (*)    HCT 27.4 (*)    RDW 16.0 (*)    All other components within normal limits  CULTURE, BLOOD (ROUTINE X 2)  CULTURE, BLOOD (ROUTINE X 2)  CULTURE, EXPECTORATED SPUTUM-ASSESSMENT  GRAM STAIN  HIV ANTIBODY (ROUTINE TESTING)  LEGIONELLA ANTIGEN, URINE  STREP PNEUMONIAE URINARY ANTIGEN  CREATININE, SERUM    Imaging Review Ct Abdomen Pelvis Wo Contrast  01/24/2014   CLINICAL DATA:  Nausea and vomiting  EXAM: CT ABDOMEN AND PELVIS WITHOUT CONTRAST  TECHNIQUE: Multidetector CT imaging of the abdomen and pelvis was performed following the standard protocol without intravenous contrast.  COMPARISON:  11/16/2009  FINDINGS: The right lung base demonstrates minimal atelectatic changes. A moderate left-sided pleural effusion with associated left lower lobe consolidation is noted. Coronary calcifications are seen.  The liver is well visualized and demonstrates no evidence of focal mass lesion. Air is noted within the  biliary tree  stable from the prior exam. Changes of prior cholecystectomy are seen. The spleen, adrenal glands and pancreas are stable in appearance.  Kidneys demonstrate bilateral tiny nonobstructing renal calculi. A large right renal cyst is again identified and stable. Diffuse aortic calcifications are noted as well as changes of by iliac stenting. The appendix is within normal limits. The bladder is partially distended. No pelvic mass lesion is seen. Chronic degenerative changes of the lumbar spine are seen. No acute abnormality is identified.  IMPRESSION: Left pleural effusion with compressive atelectasis/ consolidation in the left lower lobe.  Tiny nonobstructing renal calculi.  Chronic changes without acute abnormality.   Electronically Signed   By: Inez Catalina M.D.   On: 01/24/2014 13:44   Ct Head Wo Contrast  01/24/2014   CLINICAL DATA:  Patient was hallucinating yesterday. History of Parkinson's disease. Not eating. Nauseous.  EXAM: CT HEAD WITHOUT CONTRAST  TECHNIQUE: Contiguous axial images were obtained from the base of the skull through the vertex without intravenous contrast.  COMPARISON:  07/14/2013  FINDINGS: There is no evidence of mass effect, midline shift, or extra-axial fluid collections. There is no evidence of a space-occupying lesion or intracranial hemorrhage. There is no evidence of a cortical-based area of acute infarction. There is generalized cerebral atrophy. There is periventricular white matter low attenuation likely secondary to microangiopathy.  The ventricles and sulci are appropriate for the patient's age. The basal cisterns are patent.  Visualized portions of the orbits are unremarkable. There is bilateral maxillary sinus mucosal thickening, left greater than right. Cerebrovascular atherosclerotic calcifications are noted.  The osseous structures are unremarkable. There is incomplete fusion of the posterior arch of C1 likely developmental.  IMPRESSION: No acute intracranial pathology.    Electronically Signed   By: Kathreen Devoid   On: 01/24/2014 09:54   Dg Abd Acute W/chest  01/24/2014   CLINICAL DATA:  ALTERED MENTAL STATUS NAUSEA  EXAM: ACUTE ABDOMEN SERIES (ABDOMEN 2 VIEW & CHEST 1 VIEW)  COMPARISON:  DG HIP BILATERAL W/PELVIS dated 07/15/2013; DG CHEST 1V PORT dated 07/14/2013  FINDINGS: There is no evidence of dilated bowel loops or free intraperitoneal air. Multiple air-filled loops of large small bowel are appreciated. The there is gaseous distention of the stomach. No radiopaque calculi or other significant radiographic abnormality is seen. Heart size and mediastinal contours are within normal limits. A small left pleural effusion is appreciated. No further focal regions of consolidation or focal infiltrates.  IMPRESSION: Nonspecific nonobstructive bowel gas pattern. Small left pleural effusion.   Electronically Signed   By: Margaree Mackintosh M.D.   On: 01/24/2014 09:54     EKG Interpretation None      Date: 01/24/2014  Rate: 75  Rhythm: normal sinus rhythm  QRS Axis: normal  Intervals: polonged QT  ST/T Wave abnormalities: nonspecific ST/T changes  Conduction Disutrbances:none and artifact thoughout  Narrative Interpretation:   Old EKG Reviewed: changes notednew prolonged QT    MDM   Final diagnoses:  CAP (community acquired pneumonia)    Pt is a 81 y.o. male with Pmhx as above including parkinson's who presents with dec LOC/AMS.  Family report he has not been eating or drinking much in about 1 month, choking/coughing with PO intake. He has been complaining of nausea, was hallucinating yesterday and had dec responsiveness this morning. Wife also reports about 2-3 mins of contorting facial movements with dec responsiveness following the episode this morning. Pt comes w/ living will stating he does not want  life-prolonging measures and does not want IVF or feeding tube for artifical nutrition.  Family state that they would however, want abx or treatment for acutely  reversible conditions, potentially would want hospital admission depending on the cause. On PE. Pt pale, listless, though have moist mucus membranes.  Mild abdominal ttp w/o rebound.  Generalized weakness, but no focal neuro findings.  CT head, CXR w/o acute findings. AAS w/ nonspecific bowel bas patterns. CT ab/pelvis ordered which showed  LLL effusion/consolidation. Pt started on rocephin/azithro and will be admitted to medical service. Pt tolerated PO liquids in dept. Pt and family are receptive to speaking w/ hospice.         Neta Ehlers, MD 01/24/14 2045

## 2014-01-24 NOTE — ED Notes (Signed)
Condom cath was applied on pt.to void.

## 2014-01-24 NOTE — H&P (Signed)
[facility] History and Physical  Justin Lane [id number] DOB: 13-Sep-1930 DOA: 01/24/2014  Referring physician: PCP: Elsie Stain, MD  Specialists:  Chief Complaint: Altered mental status  HPI: Justin Lane is a 80 y.o. male  With a history of Parkinson's disease and dementia, peripheral vascular disease, coronary artery disease, hypertension, hypothyroidism presents to the emergency room for altered mental status. Patient is accompanied by his family. Patients family states he is more awake at the time of my exam as compared to when he first came in. Patient currently denies any chest pain or shortness of breath. He does complain of nausea and decreased appetite. According to family members, patient and nausea so the past 2-3 weeks, and was placed on Zofran by his primary care physician. Patient has had a decreased appetite for the past few weeks as well. Patient has been complaining of problems swallowing and has not been eating properly.  Review of Systems:  Constitutional: Admits to decreased appetite and fatigue. Denies fever, chills, diaphoresis.  HEENT: Denies photophobia, eye pain, redness, hearing loss, ear pain, congestion, sore throat, rhinorrhea, sneezing, mouth sores, trouble swallowing, neck pain, neck stiffness and tinnitus.   Respiratory: Denies SOB, DOE, cough, chest tightness,  and wheezing.   Cardiovascular: Denies chest pain, palpitations and leg swelling.  Gastrointestinal: Admits to nausea, denies abdominal pain or vomiting. Genitourinary: Denies dysuria, urgency, frequency, hematuria, flank pain and difficulty urinating.  Musculoskeletal: Denies myalgias, back pain, joint swelling, arthralgias and gait problem.  Skin: Denies pallor, rash and wound.  Neurological: Complains of generalized weakness. Hematological: Denies adenopathy. Easy bruising, personal or family bleeding history  Psychiatric/Behavioral: Denies suicidal ideation, mood changes,  confusion, nervousness, sleep disturbance and agitation  Past Medical History  Diagnosis Date  . GERD (gastroesophageal reflux disease)   . Hypothyroidism   . Anemia     Mild  . Hypertension   . Diabetes mellitus     Goal A1c ~8 due to concern for hypoglycemia  . Hyperlipidemia   . PVD (peripheral vascular disease)     Status post bilateral renal stenting, LE's  . Paroxysmal atrial fibrillation     On Coumadin and rate control meds  . CAD (coronary artery disease)     a. Inf MI 1997 tx with POBA;  b. s/p BMS to RCA for restonosis;  b. NSTEMI 9/04 => LHC 06/22/03:  LM 30%, LAD and CFX irregs, mRCA 80% ISR, EF 60% => PCI: Taxus DES to mRCA  . Hemorrhoids   . Gastritis 06/24/03 & 12/08/06    EGD Ardis Hughs)  . Hiatal hernia   . Gout   . Vitamin B 12 deficiency   . BPH (benign prostatic hyperplasia)   . Diverticular disease     via ACBE  . Herniated disc     L4, L5  . Near syncope 10/07/09  . Parkinson's disease     Per Dr. Krista Blue  . Chronic kidney disease     Chronic, with a creatinine in the range of 1.2-1.6;  hx of ARF 11/2009  . Renal calculus     Recurrent lithotripsy  . Hx of echocardiogram     a. Echo 10/2009:  mild LVH, EF 55-60%, b.  Echo 11/13:  mild LVH, EF 70-75%, mid cavity dynamic obstruction wth Valsalva (pk velocity 307 cm/sec, pk gradient 38 mmHg), mild MS, mean gradient 7 mmHg   Past Surgical History  Procedure Laterality Date  . Angioplasty  10/97    Multiple   . Lithotripsy  04/29/00  Due to renal lithiasis   . Cardiac catheterization  06/22/03    and PICA for in-stent restenosis  . Iliac vein angioplasty / stenting  05/16/04    Bilateral  . Laminectomy  09/28/07    L4,L5  (Dr. Lorin Mercy)  . Cholecystectomy open  10/23/2009    Attempted lap, purulent GB (Dr. Donne Hazel)  . Cataract extraction      right eye 2013   Social History:  reports that he quit smoking about 27 years ago. His smoking use included Cigarettes. He smoked 0.00 packs per day. He has never used  smokeless tobacco. He reports that he does not drink alcohol or use illicit drugs. Lives at home with his wife. Uses a wheelchair.  Allergies  Allergen Reactions  . Codeine Sulfate     REACTION: nausea and vomiting    Family History  Problem Relation Age of Onset  . Heart disease Mother   . Cancer Father     ? Skin  . Diabetes Sister   . Heart disease Brother   . Heart disease Brother    Prior to Admission medications   Medication Sig Start Date End Date Taking? Authorizing Provider  Amino Acids-Protein Hydrolys (FEEDING SUPPLEMENT, PRO-STAT SUGAR FREE 64,) LIQD Take 30 mLs by mouth 3 (three) times daily with meals.   Yes Historical Provider, MD  carbidopa-levodopa (SINEMET CR) 50-200 MG per tablet Take 1 tablet by mouth at bedtime. 12/11/13  Yes Marcial Pacas, MD  carbidopa-levodopa (SINEMET IR) 25-100 MG per tablet Take 1 tablet by mouth 3 (three) times daily.   Yes Historical Provider, MD  cilostazol (PLETAL) 100 MG tablet take 1 tablet by mouth twice a day 10/03/13  Yes Tonia Ghent, MD  docusate sodium (COLACE) 100 MG capsule Take 1 capsule (100 mg total) by mouth daily. 10/03/13  Yes Tonia Ghent, MD  donepezil (ARICEPT) 10 MG tablet Take 1 tablet (10 mg total) by mouth daily. 10/03/13  Yes Tonia Ghent, MD  dutasteride (AVODART) 0.5 MG capsule take 1 capsule by mouth once daily 10/03/13  Yes Tonia Ghent, MD  HYDROcodone-acetaminophen (NORCO/VICODIN) 5-325 MG per tablet TAKE 1/2 TO 1 TABLET BY MOUTH EVERY 6 HOURS AS NEEDED AS DIRECTED. 01/09/14  Yes Tonia Ghent, MD  levothyroxine (SYNTHROID, LEVOTHROID) 25 MCG tablet TAKE 1 TABLET BY MOUTH DAILY 10/03/13  Yes Tonia Ghent, MD  metoprolol tartrate (LOPRESSOR) 25 MG tablet Take 1 tablet (25 mg total) by mouth 2 (two) times daily. 10/03/13  Yes Tonia Ghent, MD  nitroGLYCERIN (NITROSTAT) 0.3 MG SL tablet Place 1 tablet (0.3 mg total) under the tongue every 5 (five) minutes as needed. 10/03/13  Yes Tonia Ghent, MD    ondansetron (ZOFRAN) 4 MG tablet Take 1 tablet (4 mg total) by mouth every 8 (eight) hours as needed for nausea or vomiting. 01/09/14  Yes Tonia Ghent, MD   Physical Exam: Filed Vitals:   01/24/14 1451  BP: 183/62  Pulse: 81  Temp:   Resp: 17     General: Well developed, malnourished, NAD, appears stated age  HEENT: NCAT, PERRLA, EOMI, Anicteic Sclera, mucous membranes dry.  Neck: Supple, no JVD, no masses  Cardiovascular: S1 S2 auscultated, no rubs, murmurs or gallops. Regular rate and rhythm.  Respiratory: Clear to auscultation bilaterally with equal chest rise  Abdomen: Soft, nontender, nondistended, + bowel sounds  Extremities: warm dry without cyanosis clubbing.  Bilateral extremity edema, +3.    Neuro: AAOx3, cranial nerves grossly intact.  Strength 4/5 in patient's upper and lower extremities bilaterally  Skin: Without rashes exudates or nodules, Upper extremity bruising  Psych: Normal affect and demeanor with intact judgement and insight  Labs on Admission:  Basic Metabolic Panel:  Recent Labs Lab 01/24/14 0855  NA 139  K 3.8  CL 97  CO2 20  GLUCOSE 125*  BUN 51*  CREATININE 1.91*  CALCIUM 9.3   Liver Function Tests:  Recent Labs Lab 01/24/14 0855  AST 13  ALT <5  ALKPHOS 71  BILITOT 0.9  PROT 6.9  ALBUMIN 3.4*   No results found for this basename: LIPASE, AMYLASE,  in the last 168 hours No results found for this basename: AMMONIA,  in the last 168 hours CBC:  Recent Labs Lab 01/24/14 0855  WBC 7.6  HGB 10.7*  HCT 33.0*  MCV 85.5  PLT 201   Cardiac Enzymes: No results found for this basename: CKTOTAL, CKMB, CKMBINDEX, TROPONINI,  in the last 168 hours  BNP (last 3 results)  Recent Labs  07/14/13 1810  PROBNP 3907.0*   CBG: No results found for this basename: GLUCAP,  in the last 168 hours  Radiological Exams on Admission: Ct Abdomen Pelvis Wo Contrast  01/24/2014   CLINICAL DATA:  Nausea and vomiting  EXAM: CT ABDOMEN  AND PELVIS WITHOUT CONTRAST  TECHNIQUE: Multidetector CT imaging of the abdomen and pelvis was performed following the standard protocol without intravenous contrast.  COMPARISON:  11/16/2009  FINDINGS: The right lung base demonstrates minimal atelectatic changes. A moderate left-sided pleural effusion with associated left lower lobe consolidation is noted. Coronary calcifications are seen.  The liver is well visualized and demonstrates no evidence of focal mass lesion. Air is noted within the biliary tree stable from the prior exam. Changes of prior cholecystectomy are seen. The spleen, adrenal glands and pancreas are stable in appearance.  Kidneys demonstrate bilateral tiny nonobstructing renal calculi. A large right renal cyst is again identified and stable. Diffuse aortic calcifications are noted as well as changes of by iliac stenting. The appendix is within normal limits. The bladder is partially distended. No pelvic mass lesion is seen. Chronic degenerative changes of the lumbar spine are seen. No acute abnormality is identified.  IMPRESSION: Left pleural effusion with compressive atelectasis/ consolidation in the left lower lobe.  Tiny nonobstructing renal calculi.  Chronic changes without acute abnormality.   Electronically Signed   By: Inez Catalina M.D.   On: 01/24/2014 13:44   Ct Head Wo Contrast  01/24/2014   CLINICAL DATA:  Patient was hallucinating yesterday. History of Parkinson's disease. Not eating. Nauseous.  EXAM: CT HEAD WITHOUT CONTRAST  TECHNIQUE: Contiguous axial images were obtained from the base of the skull through the vertex without intravenous contrast.  COMPARISON:  07/14/2013  FINDINGS: There is no evidence of mass effect, midline shift, or extra-axial fluid collections. There is no evidence of a space-occupying lesion or intracranial hemorrhage. There is no evidence of a cortical-based area of acute infarction. There is generalized cerebral atrophy. There is periventricular white  matter low attenuation likely secondary to microangiopathy.  The ventricles and sulci are appropriate for the patient's age. The basal cisterns are patent.  Visualized portions of the orbits are unremarkable. There is bilateral maxillary sinus mucosal thickening, left greater than right. Cerebrovascular atherosclerotic calcifications are noted.  The osseous structures are unremarkable. There is incomplete fusion of the posterior arch of C1 likely developmental.  IMPRESSION: No acute intracranial pathology.   Electronically Signed   By:  Kathreen Devoid   On: 01/24/2014 09:54   Dg Abd Acute W/chest  01/24/2014   CLINICAL DATA:  ALTERED MENTAL STATUS NAUSEA  EXAM: ACUTE ABDOMEN SERIES (ABDOMEN 2 VIEW & CHEST 1 VIEW)  COMPARISON:  DG HIP BILATERAL W/PELVIS dated 07/15/2013; DG CHEST 1V PORT dated 07/14/2013  FINDINGS: There is no evidence of dilated bowel loops or free intraperitoneal air. Multiple air-filled loops of large small bowel are appreciated. The there is gaseous distention of the stomach. No radiopaque calculi or other significant radiographic abnormality is seen. Heart size and mediastinal contours are within normal limits. A small left pleural effusion is appreciated. No further focal regions of consolidation or focal infiltrates.  IMPRESSION: Nonspecific nonobstructive bowel gas pattern. Small left pleural effusion.   Electronically Signed   By: Margaree Mackintosh M.D.   On: 01/24/2014 09:54    EKG: None  Assessment/Plan  Acute Encephalopathy secondary to community-acquired pneumonia Patient will be admitted to medical floor. Patient does appear to be more alert according to family members. CT of the abdomen and pelvis showed left pleural effusion with compressive atelectasis/consolidation a left lower lobe. Patient will be treated for community-acquired pneumonia with IV Rocephin and azithromycin. Will order a sputum culture and Gram stain if possible, urine Legionella and strep pneumonia antigens.   Patient currently afebrile, and has no leukocytosis.  CT of the head showed no acute intercranial pathology.  Dysphagia with nausea Patient will be made n.p.o. for now. Will consult speech therapy for evaluation and treatment. CT of the abdomen showed tiny nonobstructing renal collecting line. Abdominal series with chest shows nonspecific nonobstructive bowel gas pattern. Will continue Zofran when necessary.  Severe protein calorie malnutrition Once speech evaluation is completed, will consult nutrition.  Hypertension Uncontrolled. Will continue Lopressor. Will at on medications if needed.  Hypothyroidism Continue Synthroid. Patient's last TSH level on 01/02/2014 was 6.81.  Parkinson's disease with dementia Continue Sinemet and Aricept.  BPH Continue Avodart.  Peripheral vascular disease Continue Pletal.  History of coronary artery disease Patient is currently chest pain-free. Will continue his home medications metoprolol and nitroglycerin as needed.  Chronic pain Will continue home hydrocodone.  Acute on chronic Chronic kidney disease, stage III Baseline creatinine appears to be approximately 1.5. Currently 1.9.  Will gently rehydrate patient and monitor his BMP.  DVT prophylaxis: Lovenox  Code Status: DO NOT RESUSCITATE  Condition: Guarded  Family Communication: Family at bedside. Admission, patients condition and plan of care including tests being ordered have been discussed with the patient and family who indicate understanding and agree with the plan and Code Status.  Disposition Plan: Admitted  Time spent: 60 minutes  Alcan Border D.O. [facility] Pager [phone]  If 7PM-7AM, please contact night-coverage [email or web address] Password [facility] 01/24/2014, 4:10 PM

## 2014-01-25 ENCOUNTER — Other Ambulatory Visit (HOSPITAL_COMMUNITY): Payer: Medicare Other

## 2014-01-25 ENCOUNTER — Inpatient Hospital Stay (HOSPITAL_COMMUNITY): Payer: Medicare Other

## 2014-01-25 DIAGNOSIS — R569 Unspecified convulsions: Secondary | ICD-10-CM

## 2014-01-25 LAB — CBC
HCT: 28.6 % — ABNORMAL LOW (ref 39.0–52.0)
Hemoglobin: 9.1 g/dL — ABNORMAL LOW (ref 13.0–17.0)
MCH: 27.2 pg (ref 26.0–34.0)
MCHC: 31.8 g/dL (ref 30.0–36.0)
MCV: 85.6 fL (ref 78.0–100.0)
PLATELETS: 194 10*3/uL (ref 150–400)
RBC: 3.34 MIL/uL — ABNORMAL LOW (ref 4.22–5.81)
RDW: 16.3 % — AB (ref 11.5–15.5)
WBC: 9.1 10*3/uL (ref 4.0–10.5)

## 2014-01-25 LAB — BASIC METABOLIC PANEL
BUN: 48 mg/dL — ABNORMAL HIGH (ref 6–23)
CALCIUM: 8.6 mg/dL (ref 8.4–10.5)
CO2: 19 mEq/L (ref 19–32)
Chloride: 102 mEq/L (ref 96–112)
Creatinine, Ser: 1.99 mg/dL — ABNORMAL HIGH (ref 0.50–1.35)
GFR, EST AFRICAN AMERICAN: 35 mL/min — AB (ref >90)
GFR, EST NON AFRICAN AMERICAN: 30 mL/min — AB (ref >90)
Glucose, Bld: 107 mg/dL — ABNORMAL HIGH (ref 70–99)
Potassium: 3.6 mEq/L — ABNORMAL LOW (ref 3.7–5.3)
SODIUM: 139 meq/L (ref 137–147)

## 2014-01-25 LAB — HIV ANTIBODY (ROUTINE TESTING W REFLEX): HIV 1&2 Ab, 4th Generation: NONREACTIVE

## 2014-01-25 LAB — GLUCOSE, CAPILLARY
GLUCOSE-CAPILLARY: 96 mg/dL (ref 70–99)
GLUCOSE-CAPILLARY: 107 mg/dL — AB (ref 70–99)

## 2014-01-25 MED ORDER — ATROPINE SULFATE 1 % OP SOLN
2.0000 [drp] | Freq: Four times a day (QID) | OPHTHALMIC | Status: DC
  Administered 2014-01-25 – 2014-01-26 (×3): 2 [drp] via SUBLINGUAL
  Filled 2014-01-25: qty 2

## 2014-01-25 MED ORDER — DEXTROSE-NACL 5-0.9 % IV SOLN
INTRAVENOUS | Status: DC
  Administered 2014-01-25: 1000 mL via INTRAVENOUS

## 2014-01-25 MED ORDER — LORAZEPAM 2 MG/ML IJ SOLN
1.0000 mg | Freq: Four times a day (QID) | INTRAMUSCULAR | Status: DC
  Administered 2014-01-25 – 2014-01-26 (×4): 1 mg via INTRAVENOUS
  Filled 2014-01-25 (×4): qty 1

## 2014-01-25 MED ORDER — MORPHINE SULFATE 2 MG/ML IJ SOLN
1.0000 mg | INTRAMUSCULAR | Status: DC | PRN
  Administered 2014-01-25 – 2014-01-26 (×3): 2 mg via INTRAVENOUS
  Filled 2014-01-25 (×3): qty 1

## 2014-01-25 MED ORDER — SCOPOLAMINE 1 MG/3DAYS TD PT72
1.0000 | MEDICATED_PATCH | TRANSDERMAL | Status: DC
  Administered 2014-01-25: 1.5 mg via TRANSDERMAL
  Filled 2014-01-25: qty 1

## 2014-01-25 MED ORDER — LORAZEPAM 2 MG/ML IJ SOLN
1.0000 mg | INTRAMUSCULAR | Status: DC | PRN
  Administered 2014-01-25: 1 mg via INTRAVENOUS
  Filled 2014-01-25 (×2): qty 1

## 2014-01-25 NOTE — Significant Event (Signed)
Rapid Response Event Note  Overview: Time Called: 8341 Arrival Time: 9622 Event Type: Neurologic  Initial Focused Assessment: Called by bedside RN regarding possible 30 seconds of seizure activity. Bedside RN was in patient's room to give PRN BP meds when she turned and witness 30 seconds of rhythmic arm twitching in both arms in which crossed in midline and his head deviated to the left. No incontinence noted during the event. Upon arrival to the floor patient only arousable to noxious stimuli, but does move all extremeties. Patient will not open eyes or follow commands. Skin warm and dry, patient maintaining his airway without any issues. VSS and CBG WNL. Suction setup is at the bedside  Interventions: NP notified. Orders received for CT of head. Will continue to monitor, advised bedside RN to call with further needs.  Event Summary: Name of Physician Notified: Fredderick Severance, NP at [phone]    at    Outcome: Stayed in room and stabalized  Event End Time: [place]

## 2014-01-25 NOTE — Clinical Social Work Psychosocial (Signed)
Clinical Social Work Department BRIEF PSYCHOSOCIAL ASSESSMENT 01/25/2014  Patient:  Justin Lane, Justin Lane     Account Number:  [id number]     Admit date:  01/24/2014  Clinical Social Worker:  Lovey Newcomer  Date/Time:  01/25/2014 01:00 PM  Referred by:  Physician  Date Referred:  01/25/2014 Referred for  Residential hospice placement   Other Referral:   Interview type:  Family Other interview type:   Patient not oriented at time of assessment.    PSYCHOSOCIAL DATA Living Status:  WIFE Admitted from facility:   Level of care:   Primary support name:  Patsy Primary support relationship to patient:  SPOUSE Degree of support available:   Support is strong. Patient has many family members at bedside.    CURRENT CONCERNS Current Concerns  Post-Acute Placement   Other Concerns:    SOCIAL WORK ASSESSMENT / PLAN CSW assessed patient for residential hospice placement. Per MD patient's family were requesting [facility] for patient. CSW made referral and liaison met with family at bedside. Patient will have a bed at [facility] to be admitted tomorrow morning. Family is happy to know that patient will receive EOL care at [facility] as this is close to family. CSW will assist with DC when appropriate.   Assessment/plan status:  Psychosocial Support/Ongoing Assessment of Needs Other assessment/ plan:   Make Hospice Referrals   Information/referral to community resources:    PATIENT'S/FAMILY'S RESPONSE TO PLAN OF CARE: Patient's family plans for patient to be discharged to [facility] tomorrow morning. Erling Conte has met with family and has completed admission paperwork. Family is appreciative of CSWs assistance with residential hospice placement.       Liz Beach MSW, [place], [place], [phone]

## 2014-01-25 NOTE — Progress Notes (Signed)
PATIENT DETAILS Name: Justin Lane Age: 80 y.o. Sex: male Date of Birth: 01-Mar-1930 Admit Date: 01/24/2014 Admitting Physician Cristal Ford, DO ZOX:[id number] Damita Dunnings, MD  Subjective: Admitted with altered mental status. Had seizure-like activity at home as well yesterday, had to seizures this morning, both witnessed by RN. Currently barely responsive, per RN family this is how he has been since yesterday.  Assessment/Plan: Acute encephalopathy - Likely a combination from post ictal state and also from pneumonia - CT head negative for acute abnormalities - Attempted to do an MRI early this morning, however had a seizure while getting an MRI. We'll not pursue any further workup at this time. - I had a long discussion with spouse, son and daughter at bedside this morning, did not want any heroic measures/ no aggressive measures. Apparently patient has been declining significantly over the past few months, he has not had any significant oral intake for one month now, is already bedbound. Her desire to transition to full comfort status, and transition to residential hospice on discharge.  Seizures - Per family, patient did have a seizure-like activity at home yesterday. He had 2 more episodes this morning. MRI brain, EEG was ordered, unfortunately while getting an MRI, patient had another episode of seizure. Patient was subsequently given Ativan, MRI was not completed, prolonged discussion with family members were done. Son, daughter along with spouse at bedside were present. They have indicated that the patient did not want aggressive care, did not want to prolong his life this way. Per family, patient has had significant decline in health over the past one month, he has hardly eaten a meal,only takes a few bites. He is already bedbound. They do not desire neurology consultation (subsequently have canceled neurology consultation), any further workup including MRI or EEG. They do not want to  start antiepileptic therapy. They want to control his seizures with Ativan, and transition to full comfort measures. Current plan is for residential hospice placement on discharge.  Pneumonia - Suspect aspiration, since we are transitioning to comfort measures, all antibiotics have been discontinued. Patient was started on Rocephin and Zithromax on 4/22  Dysphagia - No further workup indicated at this time, as we are transitioning to a full comfort measures. Will cancel speech therapy evaluation. Comfort feeding if and when patient tolerates. However as noted above, patient has had significant decline in health, has had hardly any significant PO intake for the past one month.  Parkinson's disease - Continue with Sinemet as much as possible, but given decline in mental status, and dysphagia, doubt he will be able to tolerate further oral intake- even pills at this time.  Dementia - Stop Aricept, significant decline in functional status. Failure to thrive ongoing. -have initiated comfort measures  HTN -stop all oral anti-hypertensives. Have transitioned to comfort measures.  Hypothyroidism - Stop levothyroxine, comfort measures.  Failure to thrive - Significant decline functional status, not able to ambulate, mostly wheelchair/bedbound. No significant oral intake for the past one month or so, has declined rapidly over the past few weeks. - Family aware of very poor overall prognosis, suspect life expectancy few days/few weeks.  Ethics-end of life issue - DO NOT RESUSCITATE reconfirmed - No heroic measures-no artificial feeding - No further blood work, no antibiotics - No further workup for seizures, attempt to control seizures with scheduled Ativan - Comfort diet if able to tolerate - As needed morphine, as needed Ativan-on top of scheduled Ativan - Family desires  residential hospice on discharge   Disposition: Remain inpatient  DVT Prophylaxis: Not needed-end of life  care  Code Status:  DNR  Family Communication Son, spouse and daughter at bedside  Procedures:  None  CONSULTS:  None  Time spent 40 minutes-which includes 50% of the time with face-to-face with patient/ family and coordinating care related to the above assessment and plan.  MEDICATIONS: Scheduled Meds: . atropine  2 drop Sublingual QID  . carbidopa-levodopa  1 tablet Oral QHS  . carbidopa-levodopa  1 tablet Oral TID  . LORazepam  1 mg Intravenous Q6H  . scopolamine  1 patch Transdermal Q72H   Continuous Infusions: . dextrose 5 % and 0.9% NaCl 1,000 mL (01/25/14 0800)   PRN Meds:.LORazepam, morphine injection  Antibiotics: Anti-infectives   Start     Dose/Rate Route Frequency Ordered Stop   01/25/14 1600  cefTRIAXone (ROCEPHIN) 1 g in dextrose 5 % 50 mL IVPB  Status:  Discontinued     1 g 100 mL/hr over 30 Minutes Intravenous Every 24 hours 01/24/14 1718 01/25/14 0954   01/25/14 1600  azithromycin (ZITHROMAX) 500 mg in dextrose 5 % 250 mL IVPB  Status:  Discontinued     500 mg 250 mL/hr over 60 Minutes Intravenous Every 24 hours 01/24/14 1718 01/25/14 0954   01/24/14 1545  cefTRIAXone (ROCEPHIN) 1 g in dextrose 5 % 50 mL IVPB     1 g 100 mL/hr over 30 Minutes Intravenous  Once 01/24/14 1531 01/24/14 1656   01/24/14 1545  azithromycin (ZITHROMAX) 500 mg in dextrose 5 % 250 mL IVPB     500 mg 250 mL/hr over 60 Minutes Intravenous  Once 01/24/14 1531 01/24/14 1756       PHYSICAL EXAM: Vital signs in last 24 hours: Filed Vitals:   01/25/14 0551 01/25/14 0555 01/25/14 0604 01/25/14 0700  BP: 197/62 170/59 135/81   Pulse: 87  83   Temp:      TempSrc:      Resp:      Height:      Weight:    63 kg (138 lb 14.2 oz)  SpO2:  100% 99%     Weight change:  Filed Weights   01/24/14 1718 01/25/14 0700  Weight: 28.894 kg (63 lb 11.2 oz) 63 kg (138 lb 14.2 oz)   Body mass index is 18.83 kg/(m^2).   Gen Exam: Barely responsive, lethargic. Does not follow  commands Neck: Supple, No JVD.  Chest: B/L Clear.   CVS: S1 S2 Regular, no murmurs.  Abdomen: soft, BS +, non tender, non distended.  Extremities: no edema, lower extremities warm to touch. Neurologic: Moves all 4 extremities to pain  Skin: No Rash.   Wounds: N/A.   Intake/Output from previous day:  Intake/Output Summary (Last 24 hours) at 01/25/14 1041 Last data filed at 01/24/14 2137  Gross per 24 hour  Intake    250 ml  Output    100 ml  Net    150 ml     LAB RESULTS: CBC  Recent Labs Lab 01/24/14 0855 01/24/14 1931 01/25/14 0707  WBC 7.6 7.7 9.1  HGB 10.7* 8.8* 9.1*  HCT 33.0* 27.4* 28.6*  PLT 201 185 194  MCV 85.5 85.9 85.6  MCH 27.7 27.6 27.2  MCHC 32.4 32.1 31.8  RDW 15.9* 16.0* 16.3*    Chemistries   Recent Labs Lab 01/24/14 0855 01/24/14 1931 01/25/14 0707  NA 139  --  139  K 3.8  --  3.6*  CL 97  --  102  CO2 20  --  19  GLUCOSE 125*  --  107*  BUN 51*  --  48*  CREATININE 1.91* 1.87* 1.99*  CALCIUM 9.3  --  8.6    CBG:  Recent Labs Lab 01/24/14 2131 01/25/14 0549 01/25/14 0800  GLUCAP 92 96 107*    GFR Estimated Creatinine Clearance: 25.9 ml/min (by C-G formula based on Cr of 1.99).  Coagulation profile No results found for this basename: INR, PROTIME,  in the last 168 hours  Cardiac Enzymes No results found for this basename: CK, CKMB, TROPONINI, MYOGLOBIN,  in the last 168 hours  No components found with this basename: POCBNP,  No results found for this basename: DDIMER,  in the last 72 hours No results found for this basename: HGBA1C,  in the last 72 hours No results found for this basename: CHOL, HDL, LDLCALC, TRIG, CHOLHDL, LDLDIRECT,  in the last 72 hours No results found for this basename: TSH, T4TOTAL, FREET3, T3FREE, THYROIDAB,  in the last 72 hours No results found for this basename: VITAMINB12, FOLATE, FERRITIN, TIBC, IRON, RETICCTPCT,  in the last 72 hours No results found for this basename: LIPASE, AMYLASE,  in  the last 72 hours  Urine Studies No results found for this basename: UACOL, UAPR, USPG, UPH, UTP, UGL, UKET, UBIL, UHGB, UNIT, UROB, ULEU, UEPI, UWBC, URBC, UBAC, CAST, CRYS, UCOM, BILUA,  in the last 72 hours  MICROBIOLOGY: No results found for this or any previous visit (from the past 240 hour(s)).  RADIOLOGY STUDIES/RESULTS: Ct Abdomen Pelvis Wo Contrast  01/24/2014   CLINICAL DATA:  Nausea and vomiting  EXAM: CT ABDOMEN AND PELVIS WITHOUT CONTRAST  TECHNIQUE: Multidetector CT imaging of the abdomen and pelvis was performed following the standard protocol without intravenous contrast.  COMPARISON:  11/16/2009  FINDINGS: The right lung base demonstrates minimal atelectatic changes. A moderate left-sided pleural effusion with associated left lower lobe consolidation is noted. Coronary calcifications are seen.  The liver is well visualized and demonstrates no evidence of focal mass lesion. Air is noted within the biliary tree stable from the prior exam. Changes of prior cholecystectomy are seen. The spleen, adrenal glands and pancreas are stable in appearance.  Kidneys demonstrate bilateral tiny nonobstructing renal calculi. A large right renal cyst is again identified and stable. Diffuse aortic calcifications are noted as well as changes of by iliac stenting. The appendix is within normal limits. The bladder is partially distended. No pelvic mass lesion is seen. Chronic degenerative changes of the lumbar spine are seen. No acute abnormality is identified.  IMPRESSION: Left pleural effusion with compressive atelectasis/ consolidation in the left lower lobe.  Tiny nonobstructing renal calculi.  Chronic changes without acute abnormality.   Electronically Signed   By: Inez Catalina M.D.   On: 01/24/2014 13:44   Ct Head Wo Contrast  01/24/2014   CLINICAL DATA:  Patient was hallucinating yesterday. History of Parkinson's disease. Not eating. Nauseous.  EXAM: CT HEAD WITHOUT CONTRAST  TECHNIQUE: Contiguous  axial images were obtained from the base of the skull through the vertex without intravenous contrast.  COMPARISON:  07/14/2013  FINDINGS: There is no evidence of mass effect, midline shift, or extra-axial fluid collections. There is no evidence of a space-occupying lesion or intracranial hemorrhage. There is no evidence of a cortical-based area of acute infarction. There is generalized cerebral atrophy. There is periventricular white matter low attenuation likely secondary to microangiopathy.  The ventricles and sulci are appropriate  for the patient's age. The basal cisterns are patent.  Visualized portions of the orbits are unremarkable. There is bilateral maxillary sinus mucosal thickening, left greater than right. Cerebrovascular atherosclerotic calcifications are noted.  The osseous structures are unremarkable. There is incomplete fusion of the posterior arch of C1 likely developmental.  IMPRESSION: No acute intracranial pathology.   Electronically Signed   By: Kathreen Devoid   On: 01/24/2014 09:54   Dg Abd Acute W/chest  01/24/2014   CLINICAL DATA:  ALTERED MENTAL STATUS NAUSEA  EXAM: ACUTE ABDOMEN SERIES (ABDOMEN 2 VIEW & CHEST 1 VIEW)  COMPARISON:  DG HIP BILATERAL W/PELVIS dated 07/15/2013; DG CHEST 1V PORT dated 07/14/2013  FINDINGS: There is no evidence of dilated bowel loops or free intraperitoneal air. Multiple air-filled loops of large small bowel are appreciated. The there is gaseous distention of the stomach. No radiopaque calculi or other significant radiographic abnormality is seen. Heart size and mediastinal contours are within normal limits. A small left pleural effusion is appreciated. No further focal regions of consolidation or focal infiltrates.  IMPRESSION: Nonspecific nonobstructive bowel gas pattern. Small left pleural effusion.   Electronically Signed   By: Margaree Mackintosh M.D.   On: 01/24/2014 09:54    Shanker Kristeen Mans, MD  [facility] Pager:[phone]  If 7PM-7AM,  please contact night-coverage [email or web address] Password [facility] 01/25/2014, 10:41 AM   LOS: 1 day

## 2014-01-25 NOTE — Consult Note (Signed)
[facility] Liaison: [place] room available for Justin Lane tomorrow. Met with patient's spouse, son, daughter and several other family members to explain services and answer questions. All in agreement and spouse requesting Dr. Orpah Melter to assume care at [facility]. Spouse completed registration paperwork in anticipation of transfer tomorrow. Will need DC summary faxed to [phone] and RN to call report to [phone]. Please arrange transport for Justin Lane to arrive before noon. Dr. Sloan Leiter aware. Sent msg to CSW. Thank you. Erling Conte LCSW [phone]

## 2014-01-25 NOTE — Progress Notes (Signed)
RN attempted to give pt his PM medications but patient would not follow directions. First pill was placed in pt's mouth and he would not swallow it. RN had to remove the pill manually. Pt was arousable but sleepy. Will continue to monitor.

## 2014-01-25 NOTE — Progress Notes (Signed)
Utilization review completed.  

## 2014-01-25 NOTE — Progress Notes (Signed)
SLP Cancellation Note  Patient Details Name: Justin Lane MRN: [id number] DOB: [birth year]   Cancelled treatment:       Reason Eval/Treat Not Completed: Medical issues which prohibited therapy;Fatigue/lethargy limiting ability to participate. Arrived in eraly am to attempt swallow eval, pt not arousable. Told RN I would return in pm to attempt again. Events in MRI reviewed. Will visit pt in am tomorrow.    [name] [name] 01/25/2014, 10:21 AM

## 2014-01-25 NOTE — Progress Notes (Signed)
Palliative Consult received. Goals of care already established. Plan for [facility] in AM. If urgent needs for symptom management or PMT assistance needed please call [phone].  Lane Hacker, DO Palliative Medicine

## 2014-01-25 NOTE — Progress Notes (Signed)
RN went to patient's room to give AM medication. Upon approaching the patient to give the medications noticed a rhythmic twitching to BIL arms that were crossed midline. Patient's head was turned to left and he was clenching his jaw and making grunting type sounds. Patient appeared pale and continuous pulse ox was not picking up any reading so NRB was applied. Episode lasted approximately 30 seconds. Patient's mouth was suctioned, however, very little fluids were found. Rapid response was called back to room to see patient and on-call provider was notified of possible seizure activity. Patient not responsive to verbal stimuli but responds to painful stimuli. Will continue to monitor.

## 2014-01-25 NOTE — Progress Notes (Signed)
Called by primary RN for [place] in MRI.  Code Blue called.  As per Rn, patient was in MRI to have an exam done, Patient was noted by MRI techs to be experiencing a seizure and was biting his tongue, RN had given 1 mg ativan for seizure. MRI techs then noted that patient was unresponsive, no respirations and turned pale.  MRI techs removed patient from scanner and started CPR briefly and patient started breathing.  Upon my arrival to bedside, patient noted to have shallow breaths with occassional periods of apnea, on oxygen, VSS.  Patient noted to be DNR, MD called and updated, MRI cancelled for now.  Transported patietn back to room with RN

## 2014-01-25 NOTE — Progress Notes (Signed)
Patient appeared to have respiratory distress while being cleaned of stool. RN and NT repositioned patient to an upright position, and attempted to get a response from patient. Patient would only mumble and would not open his eyes to look at RN.  Patient was suctioned and NRB was placed on patient due to continuous pulse ox reading of 65% and patient's lips looking pale. Oxygen saturation increased to 100% on NRB. Other vital signs nonremarkable. Called RR RN to look at patient (see rapid response note). Patient weaned off of NRB and sats remained 100%. Will continue to monitor.

## 2014-01-25 NOTE — Progress Notes (Signed)
Pt condom catheter was removed and an indwelling catheter size 42fr was inserted. 124ml of urine was collected from the condom catheter.Pt. Tolerated the procedure well. The bed was in the lowest position and locked. Pt. Was positioned for comfort and the call bell was within reach.

## 2014-01-25 NOTE — Progress Notes (Signed)
Event: Notified by RN that pt noted to have approx 30 seconds of seizure like activity w/ upper extremity rhythmic, jerking type movements in both UE's w/ his head appearing fixed and deviated to the (L). There was no incontinence, and pt maintained his airway through-out event. When episode resolved pt noted to be less arousable than previously.  RR RN was notified and is currently at bedside.  Subjective: Pt unable to provide information. His family reported to RN yesterday that pt was closer to his baseline mental status. But RN reports that since approx MN pt has been very lethargic and difficult to arouse. She held some of his meds at one point because he did not awaken sufficiently to swallow safely. Pt has rec'd no sedating medications per RN.  Objective: Mr Sedgwick is and 80 y/o gentleman w/ a history of Parkinson's disease, dementia, PVD, CAD, HTN, hypothyroidism who presented to the ED on 01/24/14  for altered mental status.  Admitted for encephalopathy felt associated w/ CAP. Ct head at that time showed no acute process. At bedside pt is noted lying quietly in bed with eyes closed in NAD. He does not respond to voice but localizes to pain w/ deep sternal rub. He does MAEs' x 4. There is no obvious facial droop. PERRL. GCS currently 7. Skin w/d. BBS CTA. VSS > BP-197/62, P-84, R-16 w/ 02 sats of 100% on R/A and is afebrile. Assessment/Plan: 1. Seizure activity (New onset): Somewhat atypical in nature. Unclear etiology. Responding only to noxious stimuli but maintaining airway and sats 100% on r/a. Will obtain stat Ct head w/o cm to r/o acute stroke. Pt appears stable, so will not transfer to higher level of care at this time. Will continue to monitor closely.    Jeryl Columbia, NP-C [facility] Pager [phone]

## 2014-01-25 NOTE — Progress Notes (Signed)
Nutrition Brief Note  Chart reviewed. Pt now transitioning to comfort care.  No further nutrition interventions warranted at this time.  Please re-consult as needed.   Braeley Buskey MS, RD, LDN Inpatient Registered Dietitian Pager: [phone] After-hours pager: [phone]    

## 2014-01-25 NOTE — Clinical Social Work Note (Signed)
CSW has made referral to [facility] for Hospice Placement. CSW will await for [facility]'s response.  Liz Beach MSW, [place], [place], [phone]

## 2014-01-25 NOTE — Significant Event (Addendum)
Rapid Response Event Note  Overview: Time Called: 0312 Arrival Time: 0318 Event Type: Respiratory  Initial Focused Assessment: Called by bedside RN for "second set of eyes". While turning patient, sats dropped and patient began to cough and choke. Patient placed on 100% NRB by bedside RN and repositioned. Upon arrival patient RR 16, sats 100% while on NRB, HR 87. Lung sounds clear in all lobes, patient weaned off NRB. Patient HR 90, sats 100% on RA, RR 16. Patient's skin warm and dry. No distress noted. Patient only opens eyes briefly with noxious stimuli, will not follow commands. Bedside RN states patient has been very lethargic and had difficulty giving patient his meds earlier in the shift, NP aware. Patient is on continuous pulse ox and telemetry monitoring.   Interventions: Will continue to monitor, advised bedside RN to call with further needs.  Event Summary:   at      at    Outcome: Stayed in room and stabalized  Event End Time: [place]

## 2014-01-26 ENCOUNTER — Encounter: Payer: Self-pay | Admitting: Family Medicine

## 2014-01-26 MED ORDER — LORAZEPAM 2 MG/ML IJ SOLN: 1.0000 mg | Freq: Four times a day (QID) | INTRAMUSCULAR | Status: AC

## 2014-01-26 MED ORDER — MORPHINE SULFATE 2 MG/ML IJ SOLN: 1.0000 mg | INTRAMUSCULAR | Status: AC | PRN

## 2014-01-26 MED ORDER — ATROPINE SULFATE 1 % OP SOLN: 2.0000 [drp] | Freq: Four times a day (QID) | OPHTHALMIC | Status: AC

## 2014-01-26 MED ORDER — LORAZEPAM 2 MG/ML IJ SOLN: 1.0000 mg | INTRAMUSCULAR | Status: AC | PRN

## 2014-01-26 MED ORDER — SCOPOLAMINE 1 MG/3DAYS TD PT72: 1.0000 | MEDICATED_PATCH | TRANSDERMAL | Status: AC

## 2014-01-26 NOTE — Discharge Summary (Signed)
PATIENT DETAILS Name: Justin Lane Age: 80 y.o. Sex: male Date of Birth: Jan 04, 1930 MRN: [id number]. Admit Date: 01/24/2014 Admitting Physician: Cristal Ford, DO VOJ:[id number] Damita Dunnings, MD  Recommendations for Outpatient Follow-up:  1. Optimize comfort medications  PRIMARY DISCHARGE DIAGNOSIS:  Active Problems:   CAP (community acquired pneumonia)   Acute encephalopathy   Seizure disorder   Dysphagia      PAST MEDICAL HISTORY: Past Medical History  Diagnosis Date  . GERD (gastroesophageal reflux disease)   . Hypothyroidism   . Anemia     Mild  . Hypertension   . Diabetes mellitus     Goal A1c ~8 due to concern for hypoglycemia  . Hyperlipidemia   . PVD (peripheral vascular disease)     Status post bilateral renal stenting, LE's  . Paroxysmal atrial fibrillation     On Coumadin and rate control meds  . CAD (coronary artery disease)     a. Inf MI 1997 tx with POBA;  b. s/p BMS to RCA for restonosis;  b. NSTEMI 9/04 => LHC 06/22/03:  LM 30%, LAD and CFX irregs, mRCA 80% ISR, EF 60% => PCI: Taxus DES to mRCA  . Hemorrhoids   . Gastritis 06/24/03 & 12/08/06    EGD Ardis Hughs)  . Hiatal hernia   . Gout   . Vitamin B 12 deficiency   . BPH (benign prostatic hyperplasia)   . Diverticular disease     via ACBE  . Herniated disc     L4, L5  . Near syncope 10/07/09  . Parkinson's disease     Per Dr. Krista Blue  . Chronic kidney disease     Chronic, with a creatinine in the range of 1.2-1.6;  hx of ARF 11/2009  . Renal calculus     Recurrent lithotripsy  . Hx of echocardiogram     a. Echo 10/2009:  mild LVH, EF 55-60%, b.  Echo 11/13:  mild LVH, EF 70-75%, mid cavity dynamic obstruction wth Valsalva (pk velocity 307 cm/sec, pk gradient 38 mmHg), mild MS, mean gradient 7 mmHg  . Myocardial infarction   . Dysrhythmia     HX OF ATRIAL FIBRILATION  . Pneumonia 01/24/2014    DISCHARGE MEDICATIONS:   Medication List    STOP taking these medications       carbidopa-levodopa 25-100  MG per tablet  Commonly known as:  SINEMET IR     carbidopa-levodopa 50-200 MG per tablet  Commonly known as:  SINEMET CR     cilostazol 100 MG tablet  Commonly known as:  PLETAL     docusate sodium 100 MG capsule  Commonly known as:  COLACE     donepezil 10 MG tablet  Commonly known as:  ARICEPT     dutasteride 0.5 MG capsule  Commonly known as:  AVODART     feeding supplement (PRO-STAT SUGAR FREE 64) Liqd     HYDROcodone-acetaminophen 5-325 MG per tablet  Commonly known as:  NORCO/VICODIN     levothyroxine 25 MCG tablet  Commonly known as:  SYNTHROID, LEVOTHROID     metoprolol tartrate 25 MG tablet  Commonly known as:  LOPRESSOR     nitroGLYCERIN 0.3 MG SL tablet  Commonly known as:  NITROSTAT     ondansetron 4 MG tablet  Commonly known as:  ZOFRAN      TAKE these medications       atropine 1 % ophthalmic solution  Place 2 drops under the tongue 4 (four) times daily.  LORazepam 2 MG/ML injection  Commonly known as:  ATIVAN  Inject 0.5 mLs (1 mg total) into the vein every 4 (four) hours as needed for seizure.     LORazepam 2 MG/ML injection  Commonly known as:  ATIVAN  Inject 0.5 mLs (1 mg total) into the vein every 6 (six) hours.     morphine 2 MG/ML injection  Inject 0.5-1 mLs (1-2 mg total) into the vein every 2 (two) hours as needed.     scopolamine 1 MG/3DAYS  Commonly known as:  TRANSDERM-SCOP  Place 1 patch (1.5 mg total) onto the skin every 3 (three) days.        ALLERGIES:   Allergies  Allergen Reactions  . Codeine Sulfate     REACTION: nausea and vomiting    BRIEF HPI:  See H&P, Labs, Consult and Test reports for all details in brief, patient was admitted for evaluation of altered mental status, found to have pneumonia. Upon admission started having generalized tonic-clonic seizures. Patient is 80 years old and has advanced Parkinson disease and dementia, for the past one month prior to this admission he has had significant decline in  functional status, per family, he has hardly had any significant oral intake in the past one month.  CONSULTATIONS:   None  PERTINENT RADIOLOGIC STUDIES: Ct Abdomen Pelvis Wo Contrast  01/24/2014   CLINICAL DATA:  Nausea and vomiting  EXAM: CT ABDOMEN AND PELVIS WITHOUT CONTRAST  TECHNIQUE: Multidetector CT imaging of the abdomen and pelvis was performed following the standard protocol without intravenous contrast.  COMPARISON:  11/16/2009  FINDINGS: The right lung base demonstrates minimal atelectatic changes. A moderate left-sided pleural effusion with associated left lower lobe consolidation is noted. Coronary calcifications are seen.  The liver is well visualized and demonstrates no evidence of focal mass lesion. Air is noted within the biliary tree stable from the prior exam. Changes of prior cholecystectomy are seen. The spleen, adrenal glands and pancreas are stable in appearance.  Kidneys demonstrate bilateral tiny nonobstructing renal calculi. A large right renal cyst is again identified and stable. Diffuse aortic calcifications are noted as well as changes of by iliac stenting. The appendix is within normal limits. The bladder is partially distended. No pelvic mass lesion is seen. Chronic degenerative changes of the lumbar spine are seen. No acute abnormality is identified.  IMPRESSION: Left pleural effusion with compressive atelectasis/ consolidation in the left lower lobe.  Tiny nonobstructing renal calculi.  Chronic changes without acute abnormality.   Electronically Signed   By: Inez Catalina M.D.   On: 01/24/2014 13:44   Ct Head Wo Contrast  01/24/2014   CLINICAL DATA:  Patient was hallucinating yesterday. History of Parkinson's disease. Not eating. Nauseous.  EXAM: CT HEAD WITHOUT CONTRAST  TECHNIQUE: Contiguous axial images were obtained from the base of the skull through the vertex without intravenous contrast.  COMPARISON:  07/14/2013  FINDINGS: There is no evidence of mass effect,  midline shift, or extra-axial fluid collections. There is no evidence of a space-occupying lesion or intracranial hemorrhage. There is no evidence of a cortical-based area of acute infarction. There is generalized cerebral atrophy. There is periventricular white matter low attenuation likely secondary to microangiopathy.  The ventricles and sulci are appropriate for the patient's age. The basal cisterns are patent.  Visualized portions of the orbits are unremarkable. There is bilateral maxillary sinus mucosal thickening, left greater than right. Cerebrovascular atherosclerotic calcifications are noted.  The osseous structures are unremarkable. There is incomplete fusion  of the posterior arch of C1 likely developmental.  IMPRESSION: No acute intracranial pathology.   Electronically Signed   By: Kathreen Devoid   On: 01/24/2014 09:54   Dg Abd Acute W/chest  01/24/2014   CLINICAL DATA:  ALTERED MENTAL STATUS NAUSEA  EXAM: ACUTE ABDOMEN SERIES (ABDOMEN 2 VIEW & CHEST 1 VIEW)  COMPARISON:  DG HIP BILATERAL W/PELVIS dated 07/15/2013; DG CHEST 1V PORT dated 07/14/2013  FINDINGS: There is no evidence of dilated bowel loops or free intraperitoneal air. Multiple air-filled loops of large small bowel are appreciated. The there is gaseous distention of the stomach. No radiopaque calculi or other significant radiographic abnormality is seen. Heart size and mediastinal contours are within normal limits. A small left pleural effusion is appreciated. No further focal regions of consolidation or focal infiltrates.  IMPRESSION: Nonspecific nonobstructive bowel gas pattern. Small left pleural effusion.   Electronically Signed   By: Margaree Mackintosh M.D.   On: 01/24/2014 09:54     PERTINENT LAB RESULTS: CBC:  Recent Labs  01/24/14 1931 01/25/14 0707  WBC 7.7 9.1  HGB 8.8* 9.1*  HCT 27.4* 28.6*  PLT 185 194   CMET CMP     Component Value Date/Time   NA 139 01/25/2014 0707   K 3.6* 01/25/2014 0707   CL 102 01/25/2014  0707   CO2 19 01/25/2014 0707   GLUCOSE 107* 01/25/2014 0707   BUN 48* 01/25/2014 0707   CREATININE 1.99* 01/25/2014 0707   CALCIUM 8.6 01/25/2014 0707   PROT 6.9 01/24/2014 0855   ALBUMIN 3.4* 01/24/2014 0855   AST 13 01/24/2014 0855   ALT <5 01/24/2014 0855   ALKPHOS 71 01/24/2014 0855   BILITOT 0.9 01/24/2014 0855   GFRNONAA 30* 01/25/2014 0707   GFRAA 35* 01/25/2014 0707    GFR Estimated Creatinine Clearance: 25.9 ml/min (by C-G formula based on Cr of 1.99). No results found for this basename: LIPASE, AMYLASE,  in the last 72 hours No results found for this basename: CKTOTAL, CKMB, CKMBINDEX, TROPONINI,  in the last 72 hours No components found with this basename: POCBNP,  No results found for this basename: DDIMER,  in the last 72 hours No results found for this basename: HGBA1C,  in the last 72 hours No results found for this basename: CHOL, HDL, LDLCALC, TRIG, CHOLHDL, LDLDIRECT,  in the last 72 hours No results found for this basename: TSH, T4TOTAL, FREET3, T3FREE, THYROIDAB,  in the last 72 hours No results found for this basename: VITAMINB12, FOLATE, FERRITIN, TIBC, IRON, RETICCTPCT,  in the last 72 hours Coags: No results found for this basename: PT, INR,  in the last 72 hours Microbiology: Recent Results (from the past 240 hour(s))  CULTURE, BLOOD (ROUTINE X 2)     Status: None   Collection Time    01/24/14  7:31 PM      Result Value Ref Range Status   Specimen Description BLOOD RIGHT HAND   Final   Special Requests BOTTLES DRAWN AEROBIC AND ANAEROBIC 5CC   Final   Culture  Setup Time     Final   Value: 01/25/2014 01:19     Performed at [facility]   Culture     Final   Value:        BLOOD CULTURE RECEIVED NO GROWTH TO DATE CULTURE WILL BE HELD FOR 5 DAYS BEFORE ISSUING A FINAL NEGATIVE REPORT     Performed at [facility]   Report Status PENDING   Incomplete  CULTURE, BLOOD (ROUTINE X 2)     Status: None   Collection Time    01/24/14  7:44 PM      Result  Value Ref Range Status   Specimen Description BLOOD LEFT UPPER WRIST   Final   Special Requests BOTTLES DRAWN AEROBIC ONLY 8CC   Final   Culture  Setup Time     Final   Value: 01/25/2014 01:18     Performed at [facility]   Culture     Final   Value:        BLOOD CULTURE RECEIVED NO GROWTH TO DATE CULTURE WILL BE HELD FOR 5 DAYS BEFORE ISSUING A FINAL NEGATIVE REPORT     Performed at [facility]   Report Status PENDING   Incomplete     BRIEF HOSPITAL COURSE:  Acute encephalopathy  - Likely a combination from post ictal state and also from pneumonia  - CT head negative for acute abnormalities, - Attempted to do an MRI this admission, however had a seizure while getting an MRI. We'll not pursue any further workup at this time. - I had a long discussion with spouse, son and daughter at bedside, they did not want any heroic measures/ no aggressive measures. Apparently patient has been declining significantly over the past few months, he has not had any significant oral intake for one month now, is already bedbound.Their desire is to transition to full comfort status, and transition to residential hospice on discharge.   Seizures  - Per family, patient did have a seizure-like activity at home one day prior to discharge. He had 2 more episodes upon admission. MRI brain, EEG was ordered, unfortunately while getting an MRI, patient had another episode of seizure. Patient was subsequently given Ativan, MRI was not completed, prolonged discussion with family members were done. Son, daughter along with spouse at bedside were present. They have indicated that the patient did not want aggressive care, did not want to prolong his life this way. Per family, patient has had significant decline in health over the past one month, he has hardly eaten a meal,only takes a few bites. He is already bedbound. They do not desire neurology consultation (subsequently have canceled neurology consultation),  any further workup including MRI or EEG. They do not want to start antiepileptic therapy. They want to control his seizures with Ativan, and transition to full comfort measures. Current plan is for residential hospice placement on discharge.   Pneumonia  - Suspect aspiration, since we are transitioning to comfort measures, all antibiotics have been discontinued. Patient was started on Rocephin and Zithromax on 4/22.  Dysphagia  - No further workup indicated at this time, as we are transitioning to a full comfort measures. Will cancel speech therapy evaluation. Comfort feeding if and when patient tolerates. However as noted above, patient has had significant decline in health, has had hardly any significant PO intake for the past one month.   Parkinson's disease  - Since has declined and no PO intake at all, will discontinue Sinemet as well.  Dementia  - Stop Aricept, significant decline in functional status. Failure to thrive ongoing.  -have initiated comfort measures   HTN  -stop all oral anti-hypertensives. Have transitioned to comfort measures.   Hypothyroidism  - Stop levothyroxine, comfort measures have been initiated  Failure to thrive  - Significant decline functional status, not able to ambulate, mostly wheelchair/bedbound. No significant oral intake for the past one month or so, has declined rapidly  over the past few weeks.  - Family aware of very poor overall prognosis, suspect life expectancy few days/few weeks.   Ethics-end of life issue  - DO NOT RESUSCITATE reconfirmed  - No heroic measures-no artificial feeding  - No further blood work, no antibiotics  - No further workup for seizures, attempt to control seizures with scheduled Ativan  - Comfort diet if able to tolerate  - As needed morphine, as needed Ativan-on top of scheduled Ativan  - Family desires residential hospice on discharge    TODAY-DAY OF DISCHARGE:  Subjective:   Justin Lane today has had no  seizures since 4/23. Uneventful night, comfortable on Ativan and morphine when necessary.  Objective:   Blood pressure 176/90, pulse 88, temperature 98 F (36.7 C), temperature source Oral, resp. rate 18, height 6' (1.829 m), weight 63 kg (138 lb 14.2 oz), SpO2 100.00%.  Intake/Output Summary (Last 24 hours) at 01/26/14 0941 Last data filed at 01/26/14 0900  Gross per 24 hour  Intake 434.33 ml  Output    100 ml  Net 334.33 ml   Filed Weights   01/24/14 1718 01/25/14 0700  Weight: 28.894 kg (63 lb 11.2 oz) 63 kg (138 lb 14.2 oz)    Exam Sleeping, calm. Not in acute distress Chest-bilaterally clear CVS-S1-S2 regular Abdomen-soft nontender nondistended. Urology-sedated, however intermittently he moves all 4 extremities feet  DISCHARGE CONDITION: Stable  DISPOSITION: Residential Hospice  DISCHARGE INSTRUCTIONS:    Activity:  Bed Rest  Diet recommendation: Comfort diet      Discharge Orders   Future Appointments Provider Department Dept Phone   02/09/2014 3:30 PM Tonia Ghent, MD [place] [facility] [phone]   Future Orders Complete By Expires   Diet general  As directed    Scheduling Instructions:   Comfort feeding as tolerated      Follow-up Information   Follow up with Elsie Stain, MD. (As needed)    Specialty:  Family Medicine   Contact information:   [place] [address] [phone]         Total Time spent on discharge equals 45 minutes.  Signed: Henreitta Leber Ghimire 01/26/2014 9:41 AM

## 2014-01-26 NOTE — Care Management Note (Signed)
    Page 1 of 1   01/26/2014     12:30:39 PM CARE MANAGEMENT NOTE 01/26/2014  Patient:  Justin Lane, Justin Lane   Account Number:  [id number]  Date Initiated:  01/26/2014  Documentation initiated by:  Justin Lane  Subjective/Objective Assessment:   dx ams  admit- lives with spouse.     Action/Plan:   Anticipated DC Date:  01/26/2014   Anticipated DC Plan:  [place]  In-house referral  Clinical Social Worker      DC Planning Services  CM consult      Choice offered to / List presented to:             Status of service:  Completed, signed off Medicare Important Message given?   (If response is "NO", the following Medicare IM given date fields will be blank) Date Medicare IM given:   Date Additional Medicare IM given:    Discharge Disposition:  [name]  Per UR Regulation:  Reviewed for med. necessity/level of care/duration of stay  If discussed at [place] of Stay Meetings, dates discussed:    Comments:

## 2014-01-26 NOTE — Progress Notes (Signed)
SLP Cancellation Note  Patient Details Name: MERT DIETRICK MRN: [id number] DOB: [birth year]   Cancelled treatment:       Reason Eval/Treat Not Completed: Other (comment). Pt transitioned to comfort care. No further SLP intervention needed.    [name] Najee Manninen 01/26/2014, 7:25 AM

## 2014-01-26 NOTE — Progress Notes (Signed)
Physician notified of pt's high BP - physician state to give ordered ativan and prn morphine and continue to monitor BP. Pt in no s/s of distress and pain. Family at bedside - plan discussed with son.

## 2014-01-26 NOTE — Progress Notes (Signed)
Clinical Social Worker facilitated patient discharge by speaking to the family by bedside and facility, [facility]. Patient'ss family agreeable to this plan and arranging transport via EMS for 11:30 am . CSW will sign off, as social work intervention is no longer needed.  Jeanette Caprice, MSW, [place]

## 2014-01-26 NOTE — Progress Notes (Signed)
01/26/14 Patient going to [facility] today. Family very attentive, IV site removed, and papers ready for discharge.

## 2014-01-26 NOTE — Progress Notes (Addendum)
I accompanied patient down stair for MRI . Patient had a seizure as he was rolling into the MRI room noted by MRI Tech, Patient bitting down on his lip and twitching. Patient became pale and nonresponsive. Code blue called and MRI tech started CPR briefly Patient responded spontaneous. 1 mg of Ativan given, O2 placed, Rapid Response called for support.  Patient noted to be DNR.  Patient with periods of apena. Patient Vital signs 150's/70's.  Notified MD and transported patient back to floor. Family Updated

## 2014-01-28 ENCOUNTER — Encounter: Payer: Self-pay | Admitting: Neurology

## 2014-01-30 ENCOUNTER — Telehealth: Payer: Self-pay | Admitting: Family Medicine

## 2014-01-30 NOTE — Telephone Encounter (Signed)
Ms. Baehr called to let you know that patient passed away yesterday between 11:15 & 11:30 am at [facility]. Thank you.

## 2014-01-31 ENCOUNTER — Telehealth: Payer: Self-pay | Admitting: Neurology

## 2014-01-31 LAB — CULTURE, BLOOD (ROUTINE X 2)
CULTURE: NO GROWTH
Culture: NO GROWTH

## 2014-01-31 NOTE — Telephone Encounter (Signed)
Samel Bruna called to advise pt passed away on 02/03/2014. Thanks

## 2014-01-31 NOTE — Telephone Encounter (Signed)
Called and talked to his son.  Condolences offered.  He thanked me for the call.

## 2014-02-02 DEATH — deceased

## 2014-02-05 ENCOUNTER — Telehealth: Payer: Self-pay

## 2014-02-05 NOTE — Telephone Encounter (Signed)
Patient past away @ [facility] per Obituary °

## 2014-02-09 ENCOUNTER — Ambulatory Visit: Payer: Medicare Other | Admitting: Family Medicine

## 2014-04-18 ENCOUNTER — Ambulatory Visit: Payer: Medicare Other | Admitting: Nurse Practitioner

## 2014-07-26 ENCOUNTER — Encounter: Payer: Self-pay | Admitting: Gastroenterology
